# Patient Record
Sex: Female | Born: 1970 | Race: Black or African American | Hispanic: No | Marital: Single | Smoking: Never smoker
Health system: Southern US, Community
[De-identification: ages and names within clinical notes are randomized; demographics above are authoritative.]

## PROBLEM LIST (undated history)

## (undated) DIAGNOSIS — IMO0002 Reserved for concepts with insufficient information to code with codable children: Secondary | ICD-10-CM

## (undated) DIAGNOSIS — I340 Nonrheumatic mitral (valve) insufficiency: Secondary | ICD-10-CM

## (undated) DIAGNOSIS — R0789 Other chest pain: Secondary | ICD-10-CM

## (undated) DIAGNOSIS — R7303 Prediabetes: Secondary | ICD-10-CM

## (undated) DIAGNOSIS — Z803 Family history of malignant neoplasm of breast: Secondary | ICD-10-CM

## (undated) DIAGNOSIS — I10 Essential (primary) hypertension: Secondary | ICD-10-CM

## (undated) DIAGNOSIS — Z800 Family history of malignant neoplasm of digestive organs: Secondary | ICD-10-CM

## (undated) DIAGNOSIS — Z8249 Family history of ischemic heart disease and other diseases of the circulatory system: Secondary | ICD-10-CM

## (undated) DIAGNOSIS — I671 Cerebral aneurysm, nonruptured: Secondary | ICD-10-CM

## (undated) DIAGNOSIS — R87619 Unspecified abnormal cytological findings in specimens from cervix uteri: Secondary | ICD-10-CM

## (undated) DIAGNOSIS — O24419 Gestational diabetes mellitus in pregnancy, unspecified control: Secondary | ICD-10-CM

## (undated) DIAGNOSIS — R112 Nausea with vomiting, unspecified: Secondary | ICD-10-CM

## (undated) DIAGNOSIS — Z9889 Other specified postprocedural states: Secondary | ICD-10-CM

## (undated) DIAGNOSIS — E785 Hyperlipidemia, unspecified: Secondary | ICD-10-CM

## (undated) DIAGNOSIS — K219 Gastro-esophageal reflux disease without esophagitis: Secondary | ICD-10-CM

## (undated) HISTORY — DX: Other chest pain: R07.89

## (undated) HISTORY — DX: Family history of malignant neoplasm of breast: Z80.3

## (undated) HISTORY — DX: Family history of ischemic heart disease and other diseases of the circulatory system: Z82.49

## (undated) HISTORY — DX: Reserved for concepts with insufficient information to code with codable children: IMO0002

## (undated) HISTORY — DX: Prediabetes: R73.03

## (undated) HISTORY — DX: Family history of malignant neoplasm of digestive organs: Z80.0

## (undated) HISTORY — DX: Gestational diabetes mellitus in pregnancy, unspecified control: O24.419

## (undated) HISTORY — DX: Cerebral aneurysm, nonruptured: I67.1

## (undated) HISTORY — DX: Unspecified abnormal cytological findings in specimens from cervix uteri: R87.619

## (undated) HISTORY — PX: INTRACRANIAL ANEURYSM REPAIR: SHX1839

## (undated) HISTORY — DX: Hyperlipidemia, unspecified: E78.5

## (undated) HISTORY — PX: BREAST BIOPSY: SHX20

---

## 1997-07-15 ENCOUNTER — Other Ambulatory Visit: Admission: RE | Admit: 1997-07-15 | Discharge: 1997-07-15 | Payer: Self-pay | Admitting: Gynecology

## 1997-09-30 ENCOUNTER — Ambulatory Visit (HOSPITAL_BASED_OUTPATIENT_CLINIC_OR_DEPARTMENT_OTHER): Admission: RE | Admit: 1997-09-30 | Discharge: 1997-09-30 | Payer: Self-pay | Admitting: Surgery

## 1999-02-20 ENCOUNTER — Other Ambulatory Visit: Admission: RE | Admit: 1999-02-20 | Discharge: 1999-02-20 | Payer: Self-pay | Admitting: Gynecology

## 1999-05-05 ENCOUNTER — Encounter: Payer: Self-pay | Admitting: Gynecology

## 1999-05-05 ENCOUNTER — Ambulatory Visit (HOSPITAL_COMMUNITY): Admission: RE | Admit: 1999-05-05 | Discharge: 1999-05-05 | Payer: Self-pay | Admitting: Gynecology

## 1999-10-06 ENCOUNTER — Ambulatory Visit (HOSPITAL_COMMUNITY): Admission: RE | Admit: 1999-10-06 | Discharge: 1999-10-06 | Payer: Self-pay | Admitting: Gynecology

## 1999-10-06 ENCOUNTER — Encounter: Payer: Self-pay | Admitting: Gynecology

## 2000-02-29 ENCOUNTER — Other Ambulatory Visit: Admission: RE | Admit: 2000-02-29 | Discharge: 2000-02-29 | Payer: Self-pay | Admitting: Gynecology

## 2000-12-14 ENCOUNTER — Ambulatory Visit (HOSPITAL_COMMUNITY): Admission: RE | Admit: 2000-12-14 | Discharge: 2000-12-14 | Payer: Self-pay | Admitting: Gynecology

## 2000-12-14 ENCOUNTER — Encounter: Payer: Self-pay | Admitting: Gynecology

## 2001-04-19 ENCOUNTER — Other Ambulatory Visit: Admission: RE | Admit: 2001-04-19 | Discharge: 2001-04-19 | Payer: Self-pay | Admitting: Obstetrics and Gynecology

## 2002-01-02 ENCOUNTER — Ambulatory Visit (HOSPITAL_COMMUNITY): Admission: RE | Admit: 2002-01-02 | Discharge: 2002-01-02 | Payer: Self-pay | Admitting: Interventional Radiology

## 2002-06-11 ENCOUNTER — Other Ambulatory Visit: Admission: RE | Admit: 2002-06-11 | Discharge: 2002-06-11 | Payer: Self-pay | Admitting: Gynecology

## 2002-10-10 ENCOUNTER — Ambulatory Visit (HOSPITAL_COMMUNITY): Admission: RE | Admit: 2002-10-10 | Discharge: 2002-10-10 | Payer: Self-pay | Admitting: Gynecology

## 2002-10-10 ENCOUNTER — Encounter: Payer: Self-pay | Admitting: Gynecology

## 2003-03-23 HISTORY — PX: MYOMECTOMY: SHX85

## 2003-08-13 ENCOUNTER — Other Ambulatory Visit: Admission: RE | Admit: 2003-08-13 | Discharge: 2003-08-13 | Payer: Self-pay | Admitting: Gynecology

## 2003-08-27 ENCOUNTER — Ambulatory Visit (HOSPITAL_COMMUNITY): Admission: RE | Admit: 2003-08-27 | Discharge: 2003-08-27 | Payer: Self-pay | Admitting: Gynecology

## 2003-11-05 ENCOUNTER — Encounter (INDEPENDENT_AMBULATORY_CARE_PROVIDER_SITE_OTHER): Payer: Self-pay | Admitting: *Deleted

## 2003-11-05 ENCOUNTER — Inpatient Hospital Stay (HOSPITAL_COMMUNITY): Admission: RE | Admit: 2003-11-05 | Discharge: 2003-11-08 | Payer: Self-pay | Admitting: Gynecology

## 2004-05-19 ENCOUNTER — Emergency Department (HOSPITAL_COMMUNITY): Admission: EM | Admit: 2004-05-19 | Discharge: 2004-05-19 | Payer: Self-pay | Admitting: Emergency Medicine

## 2004-10-13 ENCOUNTER — Other Ambulatory Visit: Admission: RE | Admit: 2004-10-13 | Discharge: 2004-10-13 | Payer: Self-pay | Admitting: Gynecology

## 2005-03-26 ENCOUNTER — Other Ambulatory Visit: Admission: RE | Admit: 2005-03-26 | Discharge: 2005-03-26 | Payer: Self-pay | Admitting: Gynecology

## 2005-07-05 ENCOUNTER — Encounter: Admission: RE | Admit: 2005-07-05 | Discharge: 2005-07-05 | Payer: Self-pay | Admitting: Gynecology

## 2005-08-17 ENCOUNTER — Inpatient Hospital Stay (HOSPITAL_COMMUNITY): Admission: AD | Admit: 2005-08-17 | Discharge: 2005-08-20 | Payer: Self-pay | Admitting: Gynecology

## 2005-09-20 ENCOUNTER — Inpatient Hospital Stay (HOSPITAL_COMMUNITY): Admission: RE | Admit: 2005-09-20 | Discharge: 2005-09-23 | Payer: Self-pay | Admitting: Gynecology

## 2005-09-20 ENCOUNTER — Encounter (INDEPENDENT_AMBULATORY_CARE_PROVIDER_SITE_OTHER): Payer: Self-pay | Admitting: *Deleted

## 2005-11-01 ENCOUNTER — Other Ambulatory Visit: Admission: RE | Admit: 2005-11-01 | Discharge: 2005-11-01 | Payer: Self-pay | Admitting: Gynecology

## 2008-03-22 LAB — HM COLONOSCOPY: HM Colonoscopy: NORMAL

## 2009-02-27 ENCOUNTER — Observation Stay (HOSPITAL_COMMUNITY): Admission: EM | Admit: 2009-02-27 | Discharge: 2009-02-28 | Payer: Self-pay | Admitting: Emergency Medicine

## 2009-02-27 ENCOUNTER — Ambulatory Visit: Payer: Self-pay | Admitting: Internal Medicine

## 2009-02-28 ENCOUNTER — Encounter (INDEPENDENT_AMBULATORY_CARE_PROVIDER_SITE_OTHER): Payer: Self-pay | Admitting: Emergency Medicine

## 2009-03-22 LAB — HM MAMMOGRAPHY: HM Mammogram: NORMAL

## 2009-04-17 ENCOUNTER — Ambulatory Visit: Payer: Self-pay | Admitting: Internal Medicine

## 2009-07-09 ENCOUNTER — Ambulatory Visit: Payer: Self-pay | Admitting: Internal Medicine

## 2010-04-22 LAB — HM PAP SMEAR: HM Pap smear: NEGATIVE

## 2010-05-26 ENCOUNTER — Emergency Department (HOSPITAL_COMMUNITY): Payer: BC Managed Care – PPO

## 2010-05-26 ENCOUNTER — Emergency Department (HOSPITAL_COMMUNITY)
Admission: EM | Admit: 2010-05-26 | Discharge: 2010-05-26 | Disposition: A | Discharge status: Home / Self Care | Payer: BC Managed Care – PPO | Attending: Emergency Medicine | Admitting: Emergency Medicine

## 2010-05-26 DIAGNOSIS — K219 Gastro-esophageal reflux disease without esophagitis: Secondary | ICD-10-CM | POA: Insufficient documentation

## 2010-05-26 DIAGNOSIS — R1013 Epigastric pain: Secondary | ICD-10-CM | POA: Insufficient documentation

## 2010-05-26 DIAGNOSIS — R0789 Other chest pain: Secondary | ICD-10-CM | POA: Insufficient documentation

## 2010-05-26 LAB — COMPREHENSIVE METABOLIC PANEL
BUN: 11 mg/dL (ref 6–23)
Calcium: 9.2 mg/dL (ref 8.4–10.5)
Creatinine, Ser: 0.76 mg/dL (ref 0.4–1.2)
Glucose, Bld: 92 mg/dL (ref 70–99)
Sodium: 140 mEq/L (ref 135–145)
Total Protein: 6.4 g/dL (ref 6.0–8.3)

## 2010-05-26 LAB — URINALYSIS, ROUTINE W REFLEX MICROSCOPIC
Bilirubin Urine: NEGATIVE
Nitrite: NEGATIVE
Specific Gravity, Urine: 1.023 (ref 1.005–1.030)
pH: 5.5 (ref 5.0–8.0)

## 2010-05-26 LAB — DIFFERENTIAL
Basophils Absolute: 0 10*3/uL (ref 0.0–0.1)
Eosinophils Absolute: 0.2 10*3/uL (ref 0.0–0.7)
Eosinophils Relative: 5 % (ref 0–5)

## 2010-05-26 LAB — URINE MICROSCOPIC-ADD ON

## 2010-05-26 LAB — CBC
MCH: 28.9 pg (ref 26.0–34.0)
MCV: 89.9 fL (ref 78.0–100.0)
Platelets: 234 10*3/uL (ref 150–400)
RBC: 4.46 MIL/uL (ref 3.87–5.11)

## 2010-05-26 LAB — LIPASE, BLOOD: Lipase: 50 U/L (ref 11–59)

## 2010-05-26 LAB — POCT CARDIAC MARKERS: Troponin i, poc: <0.05 ng/mL (ref 0.00–0.09)

## 2010-05-27 LAB — URINE CULTURE
Colony Count: NO GROWTH
Culture  Setup Time: 201203061111

## 2010-06-23 LAB — POCT CARDIAC MARKERS
CKMB, poc: <1 ng/mL — ABNORMAL LOW (ref 1.0–8.0)
CKMB, poc: <1 ng/mL — ABNORMAL LOW (ref 1.0–8.0)
Myoglobin, poc: 48.5 ng/mL (ref 12–200)
Troponin i, poc: <0.05 ng/mL (ref 0.00–0.09)

## 2010-06-23 LAB — TROPONIN I: Troponin I: 0.02 ng/mL (ref 0.00–0.06)

## 2010-06-23 LAB — URINALYSIS, ROUTINE W REFLEX MICROSCOPIC
Bilirubin Urine: NEGATIVE
Glucose, UA: NEGATIVE mg/dL
Ketones, ur: NEGATIVE mg/dL
pH: 6 (ref 5.0–8.0)

## 2010-06-23 LAB — CK TOTAL AND CKMB (NOT AT ARMC): Total CK: 65 U/L (ref 7–177)

## 2010-06-23 LAB — POCT I-STAT, CHEM 8
Chloride: 105 mEq/L (ref 96–112)
Creatinine, Ser: 0.6 mg/dL (ref 0.4–1.2)
Glucose, Bld: 80 mg/dL (ref 70–99)
HCT: 41 % (ref 36.0–46.0)
Potassium: 4.1 mEq/L (ref 3.5–5.1)
Sodium: 140 mEq/L (ref 135–145)

## 2010-08-07 NOTE — Op Note (Signed)
NAMEALENAH, Sharon Mendoza                         ACCOUNT NO.:  [id number]   MEDICAL RECORD NO.:  [id number]                   PATIENT TYPE:  INP   LOCATION:  9399                                 FACILITY:  [facility]   PHYSICIAN:  Ivor Costa. Farrel Gobble, M.D.              DATE OF BIRTH:  September 19, 1970   DATE OF PROCEDURE:  11/05/2003  DATE OF DISCHARGE:                                 OPERATIVE REPORT   PREOPERATIVE DIAGNOSIS:  Symptomatic fibroid uterus.   POSTOPERATIVE DIAGNOSIS:  Symptomatic fibroid uterus.   PROCEDURE:  Multiple myomectomy.   SURGEON:  Ivor Costa. Farrel Gobble, M.D.   ASSISTANTMarcial Pacas P. Fontaine, M.D.   ANESTHESIA:  General.   INTRAVENOUS FLUID:  1100 mL of lactated Ringer's.   ESTIMATED BLOOD LOSS:  300 mL.   URINE OUTPUT:  200 mL of clear urine.   FINDINGS:  Multiple uterine fibroids, total of 16 removed.  Normal tubes and  ovaries.   COMPLICATIONS:  None.   PATHOLOGY:  Fibroids.   DESCRIPTION OF PROCEDURE:  The patient was taken to the operating room, and  general anesthesia was induced.  She was prepped in the usual sterile  fashion.  A bivalve speculum was then placed in the vagina.  The cervix was  able to be visualized, and a Foley balloon was gently guided through the  cavity and blown up.  This was attached to sterile tubing with dye for  chromopertubation to be done at the end of the procedure.  She was then  prepped and draped in the usual sterile fashion.  A Pfannenstiel skin  incision was made with a scalpel and carried through the underlying layer of  fascia with electrocautery.  The fascia was scored in the midline, incision  was extended laterally with the Mayo scissors.  The inferior aspect of the  vaginal incision was grasped with Kochers.  The underlying rectus muscles  were dissected off by blunt and sharp dissection.  In a similar fashion, the  superior aspect of the incision was grasped with Kochers, and underlying  rectus muscles were dissected  off.  The rectus muscles were separated in the  midline at which point the peritoneum was visualized.  It was noted to be  dusky and when elevated and sharply entered, there was noted to be a  hemoperitoneum, the etiology of which was unclear as we had not entered the  cavity at this point.  An examining hand was then placed in the abdomen.  The bowel and omentum were inspected and noted to be unremarkable.  The  uterus was then delivered through the incision, and there was noted to be a  rent in the serosa of the uterus at the junction of one of the large  pedunculated fibroids.  The uterus was then stabilized, and the fibroid as  well as its base was injected with dilute Pitressin solution.  Once the  bleeding had  subsided substantially, we injected the remainder of the  visible fibroids with Pitressin solution.  A single-tooth tenaculum was  placed for better mobility of the uterus.  An O'Connor-O'Sullivan retractor  was placed in the incision, and bowel was packed away.  The tubes and  ovaries were noted to be unremarkable.  We began at the area of the  pedunculated fibroid that had a spontaneous rent.  It was proximal to the  tube, however, was able to easily be dissected off without any interference  of the tube, and this was done sharply with electrocautery.  We also Bovie  cauterized transecting vessels as we progressed through the fibroid.  Another small fibroid was appreciated just inferior, and this was taken out  through the same incision.  The incision was noted to be essentially  hemostatic.  There were two smaller fibroids that were appreciated, but  these were left to be removed later.  The uterus was then rotated  inferiorly.  A large posterior fibroid was appreciated.  The serosa of the  uterus was scored, and the incision was carried through until the fibroid  was reached.  The serosa was then grasped, and the fibroid was similarly  removed with electrocautery.  A second  small fibroid was removed from this  area.  At this point, we entered the cavity and the Foley balloon placed for  later chromopertubation was identified.  There was no other fibroid noted at  this point.  Attempt to deviate the Foley in order to close the endometrial  defect was unsuccessful and the Foley balloon ultimately deflated and was  removed from the patient.  The endometrium was then reapproximated with 3-0  Vicryl.  The deep myometrial defect was reapproximated with multiple layers  of 0 Vicryl.  The serosa came together nicely and was reapproximated with 3-  0 Vicryl and noted to be hemostatic.  Attention was then turned anteriorly.  There was a small fibroid that was noted to be under the bladder flap.  The  bladder flap was elevated, entered sharply.  The incision was then carried  down bluntly until the fibroid was completely visualized and in a similar  fashion the uterine muscle was scored; the fibroid was grasped and removed  sharply.  There was a small amount of bleeding noted which was treated  successfully with cautery and at the end of the procedure, we later  identified and confirmed hemostasis in this area.  Attention was then turned  superiorly to a very large fibroid that was present and in a similar  fashion, the serosa was scored; the fibroid was excised, and passed off the  field.  Going through this incision, we palpated multiple other fibroids  which were removed through this single incision for a total of approximately  5 and after this was palpated and felt to be free of fibroids, we  reapproximated the myometrium in a similar fashion with 0 Vicryl and the  serosa with 3-0 Vicryl and, again, hemostasis was noted.  There was also  another series of fibroids that were noted close to the original incision  where the pedunculated fibroid was.  The incision was carried inferiorly, and a total of 4 fibroids were removed from this area as well.  And in a  similar  fashion, the deep muscle and serosa was closed.  Another small  fibroid was removed anteriorly just superior to the bladder flap on the  left.  Multiple small fibroids of a few millimeters  were visualized and  ablated with cautery.  The pelvis was then irrigated was copious amounts of  warm saline, and we inspected all of our areas, during which time we noted a  broad ligament fibroid.  The peritoneum was elevated around the round  ligament, gently entered.  The fibroid was able to be visualized and removed  with cautery with hemostasis appreciated of the round ligament.  Re-  irrigation of the pelvis assured [place] of hemostasis on all of our incisions.  The retractors were then removed.  The tubes and ovaries were confirmed as  normal.  The fimbriae were noted to be lush when the irrigant was in the  pelvis.  The muscle reapproximated in the midline naturally.  The fascia was  then closed with 0 Vicryl in a running fashion.  The subcu was irrigated and  reapproximated with interrupted 2-0 plain, and the skin was closed with  staples.  The patient tolerated the procedure well.  Sponge, lap, and needle  counts were correct x 2.  She received Ancef intraoperatively; however, at  the start of the procedure, we learned that the patient had too numerous to  count WBCs, and so she received also a single dose of Cipro 400 IV.                                               Ivor Costa. Farrel Gobble, M.D.    THL/MEDQ  D:  11/05/2003  T:  11/05/2003  Job:  [id number]

## 2010-08-07 NOTE — H&P (Signed)
Sharon Mendoza             ACCOUNT NO.:  [id number]   MEDICAL RECORD NO.:  [id number]          PATIENT TYPE:  INP   LOCATION:  NA                            [facility]AC[facility]  [facility]   PHYSICIAN:  Ivor Costa. Farrel Gobble, M.D. DATE OF BIRTH:  05-26-1970   DATE OF ADMISSION:  DATE OF DISCHARGE:                                HISTORY & PHYSICAL   DATE OF ADMISSION:  September 20, 2005   PRINCIPAL DIAGNOSIS:  Thirty-seven-and-a-half-week pregnancy.   HISTORY OF PRESENT ILLNESS:  The patient is [name] with an LMP of  January 01, 2006; estimated date of confinement of October 08, 2005; estimated  gestational age of 37+ weeks whose pregnancy has been complicated by preterm  labor.  The patient has been seen in the office as well as admitted to the  hospital multiple times for preterm contractions, cervicitis, and preterm  labor.  The patient is currently on Procardia XL 30 mg daily.  We requested  that the patient increase the Procardia to b.i.d.; however, she did not.  The patient, because of a history of a prior myomectomy, was scheduled for  an elective cesarean section at 38-and-a-half weeks.  However, because of  the frequency of the uterine contractions, the patient decided instead to  move the C-section up.  Risks of pulmonary maturity were reviewed with her  and accepted.  In addition, the pregnancy has been complicated by  gestational diabetes which has been moderately well controlled, as well as  advanced maternal age, and the patient did not have aneuploidy screening.  Refer to the [facility].  She is O positive, antibody negative, RPR  nonreactive, rubella immune, hepatitis B surface antigen nonreactive, HIV  nonreactive, GBS negative.   PHYSICAL EXAMINATION:  GENERAL:  She is a well-appearing gravida in no acute  distress.  HEART:  Regular rate.  LUNGS:  Clear to auscultation.  ABDOMEN:  Her uterus was gravid and nontender.  Fetal heart tones were  auscultated.  PELVIC:  On vaginal  exam, her external os was fingertip.  Her cervix was  short, soft, and -1.  Her NST was reactive and notable for contractions in  an irregular pattern.  EXTREMITIES:  Negative.   ASSESSMENT:  Thirty-seven-plus weeks with preterm contractions for an  elective primary cesarean section because of a prior myomectomy.  The  patient will present now for surgery as mentioned above.      Ivor Costa. Farrel Gobble, M.D.  Electronically Signed     THL/MEDQ  D:  09/17/2005  T:  09/17/2005  Job:  [id number]

## 2010-08-07 NOTE — Op Note (Signed)
NAMEBERTIE, MCCONATHY             ACCOUNT NO.:  [id number]   MEDICAL RECORD NO.:  [id number]          PATIENT TYPE:  INP   LOCATION:  9133                          [facility]   PHYSICIAN:  Ivor Costa. Farrel Gobble, M.D. DATE OF BIRTH:  May 10, 1970   DATE OF PROCEDURE:  DATE OF DISCHARGE:                                 OPERATIVE REPORT   PREOPERATIVE DIAGNOSES:  1.  Thirty-seven and one half week intrauterine pregnancy.  2.  Previous myomectomy.  3.  Preterm labor.  4.  Gestational diabetes.   POSTOPERATIVE DIAGNOSES:  1.  Thirty-seven and one half week intrauterine pregnancy.  2.  Previous myomectomy.  3.  Preterm labor.  4.  Gestational diabetes.  5.  Extensive pelvic and bowel adhesions.   PROCEDURE:  Primary cesarean.   ADDITIONAL PROCEDURE:  1.  Lysis of adhesions, extensive.  2.  Revsion of scar.   SURGEON:  Dr. Farrel Gobble   ASSISTANT:  Dr. Audie Box   ANESTHESIA:  Spinal.   IV FLUIDS:  3900 mL of lactated Ringer's.   ESTIMATED BLOOD LOSS:  600 mL.   URINE OUTPUT:  400 mL of clear urine.   FINDINGS:  A viable female in the vertex presentation.  Clear amniotic fluid.  Birth weight 7 pounds, Apgars 8/9.  There was a loop which turned to be a  single loop of small bowel adhesions adherent to the lower segment  peritoneum.  There were thick peritoneal bladder adhesions.  The tubes and  ovaries were not able to be visualized.   COMPLICATIONS:  None.   PATHOLOGY:  Placenta.   PROCEDURE:  The patient was taken to the operating room.  Spinal anesthesia  was induced and placed in a supine position, left lateral placement, prepped  and draped in usual sterile fashion.  After adequate anesthesia was ensured,  a Pfannenstiel skin incision was made with the scalpel, going through the  previous myomectomy scar with resection of the left side of the scar  secondary to keloid.  The incision was then carried through until the fascia  was reached which was scored in the midline  and extended laterally with the  Bovie.  The inferior aspect of the fascial incision was grasped with  Kochers.  It was noted to be markedly adherent and was dissected off by  blunt and sharp dissection.  Areas of bleeding were treated where  appropriate.  The superior aspect of this incision was then grasped with  Kochers; underlying rectus muscles were similarly dissected off by blunt and  sharp dissection.  The rectus muscles were naturally separated in the  midline, and the peritoneum had been entered during the dissection.  Examining hand was placed in the peritoneal rent, felt to be bowel, was  adherent across the entirety of the incision.  It was able to be swept  laterally to the right but not to the left.  The pyramidalis muscles were  then elevated inferiorly and gently teased off the peritoneum what appeared  to be bowels.  Just superior to this aspect was carefully blunt and sharply  dissected off the rectus muscle.  The pyramidalis muscles were ultimately  able to be sharply dissected off, and what looked like a 2nd loop of bowel  actually was a thick bladder adhesion.  The incision was then extended  laterally across the peritoneum, scoring a small portion of the muscle.  The  bowel was then able to be grasped and gently retracted so that it was able  to slowly be teased off the underlying thick peritoneal band, and this was  done successfully.  There was a small amount of bleeding, and it was felt to  be on the adhesion side of the bowel; it was noted to be free of the small  bowel.  Because of the bleeding, however, it was reapproximated with 3-0  Vicryl and was held on a tab for later identification at the end of the  procedure.  The bladder was then able to be identified, tented up, and  entered sharply with the Metzenbaums.  The bladder flap was created  digitally.  The bladder blade was then inserted in the lower uterine segment  and was incised in a transverse fashion  with a scalpel.  An amniotomy of  clear fluid was noted upon entering the cavity, and the incision was  extended bluntly.  The infant was then delivered with the aid of Baby  Elliots.  The cord was cut and clamped, handed off to the waiting  pediatricians.  The placenta was removed intact and sent off for private  cord blood banking.  The uterus was then cleared of all clots and debris.  Uterine incision was repaired with a running locked layer of 0 chromic, and  a second suture was used for imbrication purposes.  The pelvis was then  irrigated with copious amounts of warm saline.  There was a small amount of  bleeding in the peritoneum.  We then inspected the bowel adhesion, and a  small free tied ligature of the 3-0 Vicryl was placed, and hemostasis at  this point was assured.  Again, inspection showed that we were free of the  mucosa of the bowel.  It was then replaced and placed posteriorly behind the  uterus.  Because of the adhesions, however, we were unable to visualize the  adnexa, as the thick band on the left-hand side had never been taken down.  The bladder and muscles were inspected and treated where appropriate.  The  fascia was then closed with 0 Vicryl in a running fashion.  The subcu was  irrigated and treated where appropriate.  The skin was then closed with  staples.  A pressure dressing was placed.  The patient tolerated the  procedure well.  Sponge, lap, and needle counts correct x2.  She was given  Ancef intraoperatively and transferred to the PACU in stable condition.      Ivor Costa. Farrel Gobble, M.D.  Electronically Signed     THL/MEDQ  D:  09/20/2005  T:  09/20/2005  Job:  [id number]

## 2010-08-07 NOTE — Discharge Summary (Signed)
NAMESIHAM, BUCARO             ACCOUNT NO.:  [id number]   MEDICAL RECORD NO.:  [id number]          PATIENT TYPE:  INP   LOCATION:  9152                          [facility]ACILITY:  [facility]   PHYSICIAN:  Timothy P. Fontaine, M.D.DATE OF BIRTH:  January 25, 1971   DATE OF ADMISSION:  08/16/2005  DATE OF DISCHARGE:                                 DISCHARGE SUMMARY   DISCHARGE DIAGNOSES:  1.  Pregnancy at [redacted] weeks gestation, undelivered.  2.  Preterm contractions.  3.  Gestational diabetes.  4.  History of prior myomectomy for repeat cesarean section.   PROCEDURES:  None.   HOSPITAL COURSE:  A 40 year old gravida 1, para 0 at [redacted] weeks gestation who  enters with regular uterine contractions.  Her cervix was long and closed.  Fetal fibronectin was negative.  Urinalysis was suspicious for UTI.  The  patient continued to contract despite IV hydration, subsequent terbutaline  subcu and ultimately was begun on magnesium sulfate tocolysis.  The patient  was transitioned with the discontinuation of the magnesium and subsequently  began to contract and was subsequently begun on p.o. Procardia 10 mg q.6 h.  The patient was treated with betamethasone 12 mg q.24 h x2 doses due to her  gestational age and the gestational diabetes.  The patient was also treated  with Unasyn initially due to her suspicious UTI, although developed a rash  following her first dose and was switched to Macrobid 100 b.i.d.  At the  time of discharge the patient was having uterine irritability which she has  had over 24 hours prior to discharge but without regular contractions.  The  fetal tracing was reactive and the cervical exam prior to discharge showed  her cervix unchanged, long, closed and posterior.  The patient was given  ASAP call precautions, increasing pain, contractions, bleeding pressure,  rupture of membranes the patient knows to call.  Her history of prior  myomectomy and risk of uterine rupture was stressed to her and  she  understands that she should not tolerate discomfort at home.  She will be at  bedrest at home and has an appointment already scheduled in the office at  the beginning of week following discharge.      Timothy P. Fontaine, M.D.  Electronically Signed     TPF/MEDQ  D:  08/20/2005  T:  08/20/2005  Job:  [id number]

## 2010-08-07 NOTE — H&P (Signed)
NAMESTUART, Sharon Mendoza                         ACCOUNT NO.:  [id number]   MEDICAL RECORD NO.:  [id number]                   PATIENT TYPE:  INP   LOCATION:  NA                                   [facility]   PHYSICIAN:  Ivor Costa. Farrel Gobble, M.D.              DATE OF BIRTH:  1970-10-23   DATE OF ADMISSION:  DATE OF DISCHARGE:                                HISTORY & PHYSICAL   CHIEF COMPLAINT:  Symptomatic multifibroid uterus.   HISTORY OF PRESENT ILLNESS:  The patient is a 40 year old gravida 0 with a  known history of a multifibroid uterus with her menorrhagia being well  controlled with Mircette.  The patient is planning on getting married in  October 2005 and would like to conceive a child shortly thereafter.  She has  not been sexually active with her upcoming fiance but has been sexually  active in the past and had problems with dyspareunia.  She has multiple  fibroids and her uterus is U-1.   PAST OBSTETRICAL AND GYNECOLOGICAL HISTORY:  The patient is nulliparous by  choice.  She is currently not sexually active as mentioned above.  Her  cycles are every 4 weeks on the birth control pill.  Her Pap smears have  been normal.   PAST MEDICAL HISTORY:  Negative.   SURGICAL HISTORY:  Negative.   FAMILY HISTORY:  Markedly significant for history of AVMs of which the  patient has had several siblings who have died from rupture and several  siblings who have had AVMs that have been clipped.  The patient has been  followed by [facility] with MRAs and most recent report continues to be  normal.   ALLERGIES:  None.   SOCIAL HISTORY:  No alcohol, tobacco, caffeine, or exercise.   FAMILY HISTORY:  As mentioned above.  There is a family history of breast  cancer in a paternal aunt and colon cancer in her father and her paternal  aunt.   PHYSICAL EXAMINATION:  GENERAL:  She is well-appearing, in no acute  distress.  HEART:  Regular rate.  LUNGS:  Clear to auscultation.  ABDOMEN:   Soft, nontender, with a multifibroid uterus appreciated at U-1.  GYNECOLOGIC:  She has normal external female genitalia.  The BUS is  negative.  Vagina:  There is a small amount of discharge and a wet prep was  taken.  The cervix is markedly deviated posteriorly and unable to be  completely rotated secondary to the multiple fibroids.  However, with  difficulty we were able to visualize and Pap smear was able to be done.  The  uterus was bulky, U-1. The adnexa are not palpable.  Rectovaginal exam is  significant for a sharp impingement on the rectum from the fibroids.   Her latest ultrasound shows six fibroids of which three are significant -  about 6 cm each.  The ovaries were unremarkable.  The patient  had a  preoperative HSG done which shows that her tubes are normal in caliber and  the right tube no definite free spill is noted.  From the left tube there is  an impingement on the cavity consistent with the multiple fibroids.  The  patient had an IVP because of the laterality of the fibroids which was also  negative.   ASSESSMENT:  Multifibroid uterus, desiring pregnancy.  The patient will  present in the morning of November 05, 2003 for a multiple myomectomy.                                               Ivor Costa. Farrel Gobble, M.D.    THL/MEDQ  D:  11/04/2003  T:  11/04/2003  Job:  [id number]

## 2010-08-07 NOTE — Discharge Summary (Signed)
NAMELAJEAN, Sharon Mendoza             ACCOUNT NO.:  [id number]   MEDICAL RECORD NO.:  [id number]          PATIENT TYPE:  INP   LOCATION:  9133                          [facility]   PHYSICIAN:  Ivor Costa. Farrel Gobble, M.D. DATE OF BIRTH:  1970/08/22   DATE OF ADMISSION:  09/20/2005  DATE OF DISCHARGE:  09/23/2005                                 DISCHARGE SUMMARY   PRINCIPAL DIAGNOSES:  1.  37-1/2 weeks with history of preterm labor.  2.  History of myomectomy.   PRINCIPAL PROCEDURE:  A primary cesarean section. Refer to the dictated H&P.   HOSPITAL COURSE:  The patient presented in the afternoon of September 20, 2005 and  underwent a primary cesarean section with extensive lysis of adhesions and  revision of her scar under spinal anesthesia for delivery of a viable female  in the vertex presentation. Birth weight 7, Apgars 8/9. The adnexa were not  able to be visualized secondary to extensive pelvic scarring. Estimated  blood loss was 600. Her postpartum course was unremarkable. She remained  afebrile with vitals stable throughout. The patient was breast-feeding at  the time of discharge. She was using only Motrin for analgesia and was eager  for discharge.   PHYSICAL EXAMINATION:  VITAL SIGNS:  She was afebrile. Her vitals were  stable.  ABDOMEN:  Soft and nontender. Incision was clean, dry, intact.  PELVIC:  Uterus was below the umbilicus and nontender.  EXTREMITIES:  Negative.   POSTOPERATIVE LABORATORIES:  Hemoglobin 11.4, hematocrit 33.5, white count  10, and platelets 157.   DISCHARGE CONDITION:  Stable.   The patient was discharged home with instructions to follow up in the office  in six weeks. Staples will be removed prior to discharge. She had been given  a prescription for Tylox 1-2 q.6 h. p.r.n. at her preop.      Ivor Costa. Farrel Gobble, M.D.  Electronically Signed     THL/MEDQ  D:  09/23/2005  T:  09/23/2005  Job:  [id number]

## 2010-08-07 NOTE — Discharge Summary (Signed)
Sharon Mendoza, Sharon Mendoza                         ACCOUNT NO.:  [id number]   MEDICAL RECORD NO.:  [id number]                   PATIENT TYPE:  INP   LOCATION:  9309                                 FACILITY:  [facility]   PHYSICIAN:  Ivor Costa. Farrel Gobble, M.D.              DATE OF BIRTH:  Feb 20, 1971   DATE OF ADMISSION:  11/05/2003  DATE OF DISCHARGE:                                 DISCHARGE SUMMARY   PRINCIPAL DIAGNOSIS:  Symptomatic fibroids.   PRINCIPAL PROCEDURE:  Multiple myomectomy.   Please refer to the dictated H&P.   HOSPITAL COURSE:  The patient was admitted in the morning of November 05, 2003  and underwent an exploratory laparotomy with a multiple myomectomy under  general anesthesia.  Estimated blood loss was approximately 300 mL.  She was  noted to have multiple uterine fibroids.  A total of 16 were removed.  Normal tubes and ovaries.  Her postoperative course, the patient was  transferred to the PACU and then to the postpartum floor in due fashion.  Initially had problems with nausea without emesis which resolved on  postoperative day #1 after discontinuing her Dilaudid PCA.  Although she was  tolerating minimal regular diet the patient did not have any emesis.  She  developed a fever between the evening of postoperative day #1 and  postoperative day #2.  Because of bleeding the patient had not been placed  on NSAIDs postoperatively and elevated temperature was felt to be  inflammatory.  She had a CBC done at that time which showed a slight  increase in her white count of 12.6, otherwise was unremarkable.  Her Tmax  was 100.6.  It quickly went down to 100 and she quickly defervesced once  NSAIDs were begun.  She was begun empirically overnight on Unasyn which had  been discontinued in the morning.  By postoperative day #3 the patient had  been afebrile greater than 24 hours.  She was requesting discharge.  She was  requiring no pain medicine above the NSAIDs that she was taking.  She  was  ambulating without difficulty, tolerating a regular diet.  Her postoperative  physical:  Her heart was regular rate, her lungs were clear to auscultation,  she had normal bowel sounds, there was some slight ecchymosis on the  abdomen, the incision was intact.  The patient was observed until the  afternoon of August 19, again remained afebrile, and was discharged home.  She was instructed to check her temperature twice a day and p.r.n. symptoms.  She had been given a prescription for Combunox preoperatively.  She will  also take over-the-counter Motrin for the inflammatory aspect and she is  instructed to follow up in our office in 2 weeks.   Her postoperative white count was 11.2, hemoglobin 10.3, hematocrit 30.4,  platelets of 208.  Her postoperative pathology came back today and was  benign with approximately 250 g of fibroids.  POSTOPERATIVE DISPOSITION:  Stable.                                               Ivor Costa. Farrel Gobble, M.D.    THL/MEDQ  D:  11/08/2003  T:  11/08/2003  Job:  [id number]

## 2010-08-07 NOTE — H&P (Signed)
Sharon Mendoza, Sharon Mendoza             ACCOUNT NO.:  [id number]   MEDICAL RECORD NO.:  [id number]          PATIENT TYPE:  OBV   LOCATION:  9152                          FACILITY:  [facility]   PHYSICIAN:  Timothy P. Fontaine, M.D.DATE OF BIRTH:  January 13, 1971   DATE OF ADMISSION:  08/16/2005  DATE OF DISCHARGE:                                HISTORY & PHYSICAL   CHIEF COMPLAINT:  Cramping, bloody show.   HISTORY OF PRESENT ILLNESS:  [name], P0, at 49 weeks' gestation,  who presents complaining of a week to two weeks of Braxton-Hicks-type  contractions, over the last 24 hours began to increase with more intense  cramping and noted a bloody tinge on wiping in the bathroom.  She presented  to triage for evaluation.  The patient was noted in triage to have cramping,  uterine irritability, as well as regular contractions, initially every 10-15  minutes, then increasing to every 2-3 minutes per nursing report.  Nursing  exam:  Cervix is long and closed.  A fetal fibronectin was performed before  pelvic, which subsequently returned negative.  She was treated with IV  hydration.  Her contractions continued.  One subcutaneous dose of  terbutaline and again her contractions continued increasing in intensity,  rated as a 4-5.  She ultimately was begun on magnesium sulfate, a 4 g bolus,  2 g/hr.   Her prenatal course is significant for gestational diabetes, diet-  controlled, for which she reports good glucose control.  She  is scheduled  for a cesarean section September 29, 2005 due to history of myomectomy.  Her  Hollister is not available at the time of this dictation.   PAST MEDICAL HISTORY:  Uncomplicated.   PAST SURGICAL HISTORY:  abdominal myomectomy.   ALLERGIES:  None.   REVIEW OF SYSTEMS:  Noncontributory.   ADMISSION PHYSICAL EXAMINATION:  VITAL SIGNS:  Afebrile, vital signs are  stable.  HEENT:  Normal.  LUNGS:  Clear.  CARDIAC:  Regular rate without rubs, murmurs or gallops.  ABDOMEN:  Gravid fundus consistent with dates.  The uterus is soft,  nontender.  External monitors show a reassuring fetal tracing without  regular contractions.  PELVIC:  Cervix is long and closed, posterior.  Normal-appearing discharge,  no bloody show.   ASSESSMENT AND PLAN:  [name], P0, female, admitted with regular  contractions.  Her urinalysis subsequently returned suspicious for a urinary  tract infection.  She was begun on Unasyn 1.5 g IV q.6h.  Fetal fibronectin  was negative but given the continued worsening contractions, probable  etiology of a UTI, history of gestational diabetes at 32 weeks, it was felt  most prudent to proceed with a more aggressive tocolysis until antibiotics  have taken effect.  I reviewed this with the patient.  She understands and  agrees.  Reviewed steroid use and, again, given the total picture, I feel  appropriate to withhold steroids at this time given that cervix is long and  closed, fetal fibronectin negative, infection is suspected etiology of her  preterm contractions.  Will hold on steroids at this time and, again, this  is discussed with the patient.  Will transition to p.o. terbutaline for a  short course, again,  allowing antibiotics to take effect.  She did have hives four hours after  her first Unasyn dose, no history of penicillin or any drug allergies in the  past.  Will switch to p.o. Macrobid now, check baseline ultrasound and then  reassess for discharge later today or tomorrow if she remains acontractile.      Timothy P. Fontaine, M.D.  Electronically Signed     TPF/MEDQ  D:  08/17/2005  T:  08/17/2005  Job:  [id number]

## 2011-05-15 ENCOUNTER — Emergency Department (HOSPITAL_COMMUNITY)
Admission: EM | Admit: 2011-05-15 | Discharge: 2011-05-16 | Disposition: A | Discharge status: Home / Self Care | Payer: BC Managed Care – PPO | Attending: Emergency Medicine | Admitting: Emergency Medicine

## 2011-05-15 ENCOUNTER — Emergency Department (HOSPITAL_COMMUNITY): Payer: BC Managed Care – PPO

## 2011-05-15 ENCOUNTER — Other Ambulatory Visit: Payer: Self-pay

## 2011-05-15 ENCOUNTER — Encounter (HOSPITAL_COMMUNITY): Payer: Self-pay | Admitting: *Deleted

## 2011-05-15 DIAGNOSIS — R110 Nausea: Secondary | ICD-10-CM | POA: Insufficient documentation

## 2011-05-15 DIAGNOSIS — I10 Essential (primary) hypertension: Secondary | ICD-10-CM | POA: Insufficient documentation

## 2011-05-15 DIAGNOSIS — R0789 Other chest pain: Secondary | ICD-10-CM | POA: Insufficient documentation

## 2011-05-15 HISTORY — DX: Essential (primary) hypertension: I10

## 2011-05-15 LAB — CBC
MCV: 88.1 fL (ref 78.0–100.0)
Platelets: 242 10*3/uL (ref 150–400)
RBC: 4.54 MIL/uL (ref 3.87–5.11)
WBC: 8 10*3/uL (ref 4.0–10.5)

## 2011-05-15 LAB — BASIC METABOLIC PANEL
CO2: 27 mEq/L (ref 19–32)
Chloride: 103 mEq/L (ref 96–112)
Creatinine, Ser: 0.73 mg/dL (ref 0.50–1.10)
Sodium: 138 mEq/L (ref 135–145)

## 2011-05-15 LAB — TROPONIN I
Troponin I: <0.3 ng/mL (ref <0.30)
Troponin I: <0.3 ng/mL (ref <0.30)

## 2011-05-15 MED ORDER — NITROGLYCERIN 0.4 MG SL SUBL
0.4000 mg | SUBLINGUAL_TABLET | SUBLINGUAL | Status: DC | PRN
  Administered 2011-05-15: 0.4 mg via SUBLINGUAL
  Filled 2011-05-15: qty 75

## 2011-05-15 MED ORDER — ASPIRIN 325 MG PO TABS
325.0000 mg | ORAL_TABLET | ORAL | Status: AC
  Administered 2011-05-15: 325 mg via ORAL
  Filled 2011-05-15: qty 1

## 2011-05-15 NOTE — ED Notes (Signed)
Pt stated that she started having Chest pain yesterday. The pain radiated to both of her shoulders yesterday but currently is not radiating. Pain starts in the middle of her chest. Pain feels like a dull pressure. Pain is intermittent. No SOB or vomiting. Nausea present with pain. Will continue to monitor.

## 2011-05-15 NOTE — ED Notes (Signed)
Pt given 1 SL nitro with CP of 5/10, went in room to give pt second SL nitro pt states pain is a 0/10. Pt aware that nitro is ordered PRN and given call light to press if CP returns.

## 2011-05-15 NOTE — ED Notes (Signed)
EKG was done at [facility]

## 2011-05-15 NOTE — ED Notes (Signed)
Pt with left cheat pain radiating to right taht began at 4pm today and has been associated with nausea, no other symptoms with this.

## 2011-05-16 NOTE — ED Provider Notes (Signed)
History     CSN: [id number]  Arrival date & time 05/15/11  4098   First MD Initiated Contact with Patient 05/15/11 2133      Chief Complaint  Patient presents with  . Chest Pain     Patient is a 41 y.o. female presenting with chest pain. The history is provided by the patient.  Chest Pain    patient reports approximately 4 and half hours of chest discomfort without radiation.  She denies shortness of breath.  She reports nausea without diaphoresis.  She has no vomiting.  She's had no abdominal pain.  She denies diarrhea.  She said no melena or hematochezia.  She's had no cough or congestion.  She denies recent lung travel or surgery.  She has no prior history of pulmonary embolism or DVT.  She has no prior history of coronary artery disease.  She does have a strong family history of heart disease.  She has a history of hypertension.  She denies smoking diabetes and high cholesterol.  She received an aspirin and nitroglycerin.  She reports no pain at this time.  She does report stress test approximately a year and after that was normal.  She never has exertional discomfort in her chest  Past Medical History  Diagnosis Date  . Hypertension     History reviewed. No pertinent past surgical history.  No family history on file.  History  Substance Use Topics  . Smoking status: Not on file  . Smokeless tobacco: Not on file  . Alcohol Use: No    OB History    Grav Para Term Preterm Abortions TAB SAB Ect Mult Living                  Review of Systems  Cardiovascular: Positive for chest pain.  All other systems reviewed and are negative.    Allergies  Review of patient's allergies indicates no known allergies.  Home Medications   Current Outpatient Rx  Name Route Sig Dispense Refill  . ASPIRIN EC 81 MG PO TBEC Oral Take 81 mg by mouth daily.    Marland Kitchen CETIRIZINE HCL 10 MG PO TABS Oral Take 10 mg by mouth daily.    . NORETHINDRONE ACET-ETHINYL EST 1.5-30 MG-MCG PO TABS Oral  Take 1 tablet by mouth daily.    Marland Kitchen PANTOPRAZOLE SODIUM 40 MG PO TBEC Oral Take 40 mg by mouth daily.      BP 125/75  Pulse 80  Temp(Src) 98.3 F (36.8 C) (Oral)  Resp 16  Ht 5\' 3"  (1.6 m)  Wt 118 lb (53.524 kg)  BMI 20.90 kg/m2  SpO2 99%  LMP 04/23/2011  Physical Exam  Nursing note and vitals reviewed. Constitutional: She is oriented to person, place, and time. She appears well-developed and well-nourished. No distress.  HENT:  Head: Normocephalic and atraumatic.  Eyes: EOM are normal.  Neck: Normal range of motion.  Cardiovascular: Normal rate, regular rhythm and normal heart sounds.   Pulmonary/Chest: Effort normal and breath sounds normal.  Abdominal: Soft. She exhibits no distension. There is no tenderness.  Musculoskeletal: Normal range of motion.  Neurological: She is alert and oriented to person, place, and time.  Skin: Skin is warm and dry.  Psychiatric: She has a normal mood and affect. Judgment normal.    ED Course  Procedures (including critical care time)   Date: 05/16/2011  Rate: 101  Rhythm: normal sinus rhythm  QRS Axis: normal  Intervals: normal  ST/T Wave abnormalities: normal  Conduction  Disutrbances: none  Narrative Interpretation:   Old EKG Reviewed: No prior EKG    Labs Reviewed  CBC  BASIC METABOLIC PANEL  TROPONIN I  TROPONIN I   Dg Chest 2 View  05/15/2011  *RADIOLOGY REPORT*  Clinical Data: 41 year old female with chest pain.  CHEST - 2 VIEW  Comparison: 05/26/2010 and earlier.  Findings: Stable lung volumes at the upper limits of normal. Normal cardiac size and mediastinal contours.  Visualized tracheal air column is within normal limits.  The lungs are clear.  No pneumothorax or effusion. No acute osseous abnormality identified.  IMPRESSION: No acute cardiopulmonary abnormality.  Original Report Authenticated By: Harley Hallmark, M.D.   I personally reviewed the x-ray  1. Chest pain       MDM  The patient's patient's only risk  factor hypertension and family history.  She is well appearing.  She is without pain at this time.  EKG is normal.  Her EKG was obtained she was having active pain.  She has 2 negative sets of cardiac enzymes in the ER.  The patient will be referred outpatient cardiology followup.  She understands to return to the ER for new or worsening symptoms.  She is agreeable to outpatient plan        Lyanne Co, MD 05/16/11 [id number]

## 2011-05-16 NOTE — Discharge Instructions (Signed)
Chest Pain (Nonspecific) It is often hard to give a specific diagnosis for the cause of chest pain. There is always a chance that your pain could be related to something serious, such as a heart attack or a blood clot in the lungs. You need to follow up with your caregiver for further evaluation. CAUSES   Heartburn.   Pneumonia or bronchitis.   Anxiety and stress.   Inflammation around your heart (pericarditis) or lung (pleuritis or pleurisy).   A blood clot in the lung.   A collapsed lung (pneumothorax). It can develop suddenly on its own (spontaneous pneumothorax) or from injury (trauma) to the chest.  The chest wall is composed of bones, muscles, and cartilage. Any of these can be the source of the pain.  The bones can be bruised by injury.   The muscles or cartilage can be strained by coughing or overwork.   The cartilage can be affected by inflammation and become sore (costochondritis).  DIAGNOSIS  Lab tests or other studies, such as X-rays, an EKG, stress testing, or cardiac imaging, may be needed to find the cause of your pain.  TREATMENT   Treatment depends on what may be causing your chest pain. Treatment may include:   Acid blockers for heartburn.   Anti-inflammatory medicine.   Pain medicine for inflammatory conditions.   Antibiotics if an infection is present.   You may be advised to change lifestyle habits. This includes stopping smoking and avoiding caffeine and chocolate.   You may be advised to keep your head raised (elevated) when sleeping. This reduces the chance of acid going backward from your stomach into your esophagus.   Most of the time, nonspecific chest pain will improve within 2 to 3 days with rest and mild pain medicine.  HOME CARE INSTRUCTIONS   If antibiotics were prescribed, take the full amount even if you start to feel better.   For the next few days, avoid physical activities that bring on chest pain. Continue physical activities as  directed.   Do not smoke cigarettes or drink alcohol until your symptoms are gone.   Only take over-the-counter or prescription medicine for pain, discomfort, or fever as directed by your caregiver.   Follow your caregiver's suggestions for further testing if your chest pain does not go away.   Keep any follow-up appointments you made. If you do not go to an appointment, you could develop lasting (chronic) problems with pain. If there is any problem keeping an appointment, you must call to reschedule.  SEEK MEDICAL CARE IF:   You think you are having problems from the medicine you are taking. Read your medicine instructions carefully.   Your chest pain does not go away, even after treatment.   You develop a rash with blisters on your chest.  SEEK IMMEDIATE MEDICAL CARE IF:   You have increased chest pain or pain that spreads to your arm, neck, jaw, back, or belly (abdomen).   You develop shortness of breath, an increasing cough, or you are coughing up blood.   You have severe back or abdominal pain, feel sick to your stomach (nauseous) or throw up (vomit).   You develop severe weakness, fainting, or chills.   You have an oral temperature above 102 F (38.9 C), not controlled by medicine.  THIS IS AN EMERGENCY. Do not wait to see if the pain will go away. Get medical help at once. Call your local emergency services (911 in [place] Do not drive yourself to   the hospital. MAKE SURE YOU:   Understand these instructions.   Will watch your condition.   Will get help right away if you are not doing well or get worse.  Document Released: 12/16/2004 Document Revised: 11/18/2010 Document Reviewed: 10/12/2007 [employer] Patient Information 2012 [employer]. 

## 2011-05-18 HISTORY — PX: US ECHOCARDIOGRAPHY: HXRAD669

## 2011-05-19 HISTORY — PX: CARDIOVASCULAR STRESS TEST: SHX262

## 2011-08-18 ENCOUNTER — Encounter (HOSPITAL_COMMUNITY): Payer: Self-pay

## 2011-08-18 ENCOUNTER — Emergency Department (HOSPITAL_COMMUNITY)
Admission: EM | Admit: 2011-08-18 | Discharge: 2011-08-18 | Disposition: A | Discharge status: Home / Self Care | Payer: Self-pay | Attending: Emergency Medicine | Admitting: Emergency Medicine

## 2011-08-18 ENCOUNTER — Emergency Department (HOSPITAL_COMMUNITY): Payer: Self-pay

## 2011-08-18 ENCOUNTER — Other Ambulatory Visit: Payer: Self-pay

## 2011-08-18 DIAGNOSIS — I10 Essential (primary) hypertension: Secondary | ICD-10-CM | POA: Insufficient documentation

## 2011-08-18 DIAGNOSIS — R079 Chest pain, unspecified: Secondary | ICD-10-CM | POA: Insufficient documentation

## 2011-08-18 DIAGNOSIS — R000 Tachycardia, unspecified: Secondary | ICD-10-CM | POA: Insufficient documentation

## 2011-08-18 HISTORY — DX: Gastro-esophageal reflux disease without esophagitis: K21.9

## 2011-08-18 HISTORY — DX: Nonrheumatic mitral (valve) insufficiency: I34.0

## 2011-08-18 LAB — POCT I-STAT, CHEM 8
Creatinine, Ser: 0.7 mg/dL (ref 0.50–1.10)
Glucose, Bld: 93 mg/dL (ref 70–99)
Hemoglobin: 12.9 g/dL (ref 12.0–15.0)
TCO2: 23 mmol/L (ref 0–100)

## 2011-08-18 LAB — CBC
HCT: 36.4 % (ref 36.0–46.0)
Hemoglobin: 12.3 g/dL (ref 12.0–15.0)
MCH: 29.5 pg (ref 26.0–34.0)
MCV: 87.3 fL (ref 78.0–100.0)
RBC: 4.17 MIL/uL (ref 3.87–5.11)

## 2011-08-18 LAB — CARDIAC PANEL(CRET KIN+CKTOT+MB+TROPI)
CK, MB: 2.1 ng/mL (ref 0.3–4.0)
Total CK: 155 U/L (ref 7–177)

## 2011-08-18 LAB — DIFFERENTIAL
Eosinophils Absolute: 0.3 10*3/uL (ref 0.0–0.7)
Eosinophils Relative: 4 % (ref 0–5)
Lymphs Abs: 3.3 10*3/uL (ref 0.7–4.0)
Monocytes Relative: 10 % (ref 3–12)

## 2011-08-18 MED ORDER — SODIUM CHLORIDE 0.9 % IV SOLN: Freq: Once | INTRAVENOUS | Status: DC

## 2011-08-18 NOTE — ED Provider Notes (Signed)
History     CSN: [id number]  Arrival date & time 08/18/11  0105   First MD Initiated Contact with Patient 08/18/11 0222      Chief Complaint  Patient presents with  . Chest Pain    (Consider location/radiation/quality/duration/timing/severity/associated sxs/prior treatment) HPI Comments: 41 year old female with a history of hypertension, GERD, mild mitral valve regurgitation who presents with complaint of chest pain. She states this started just prior to arrival while she was giving her child a bath. It feels like a pressure, has gradually gotten worse, totally improved after getting aspirin and nitroglycerin on the ambulance and is currently resolved. She had no associated shortness of breath, no coughing no fevers no back pain no lower extremity pain or swelling, no travel, no trauma. 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 She had a negative nuclear stress test which she reports was performed at [facility] approximately one month ago. She's been having similar symptoms on several different episodes, this is the third episode. She does have a very strong family history of heart disease including 2 siblings that have had serious heart disease including a brother that died at a young age from a massive heart attack.  She states that her chest pain gets worse when she takes a deep breath. She is on oral contraceptive therapy.  Oral contraceptives and a pulse of 105 on my exam are her only risk factor for pulmonary embolism  Patient is a 41 y.o. female presenting with chest pain. The history is provided by the patient.  Chest Pain     Past Medical History  Diagnosis Date  . Hypertension   . Mitral valve regurgitation   . Gastroesophageal reflux     No past surgical history on file.  No family history on file.  History    Substance Use Topics  . Smoking status: Not on file  . Smokeless tobacco: Not on file  . Alcohol Use: No    OB History    Grav Para Term Preterm Abortions TAB SAB Ect Mult Living                  Review of Systems  Cardiovascular: Positive for chest pain.  All other systems reviewed and are negative.    Allergies  Review of patient's allergies indicates no known allergies.  Home Medications   Current Outpatient Rx  Name Route Sig Dispense Refill  . ASPIRIN EC 81 MG PO TBEC Oral Take 81 mg by mouth daily as needed. For pain.    Marland Kitchen CETIRIZINE HCL 10 MG PO TABS Oral Take 10 mg by mouth daily.    . ADULT MULTIVITAMIN W/MINERALS CH Oral Take 1 tablet by mouth daily.    . NORETHINDRONE ACET-ETHINYL EST 1.5-30 MG-MCG PO TABS Oral Take 1 tablet by mouth daily.    Marland Kitchen PANTOPRAZOLE SODIUM 40 MG PO TBEC Oral Take 40 mg by mouth daily.      BP 126/82  Pulse 93  Temp(Src) 98.3 F (36.8 C) (Oral)  Resp 21  SpO2 99%  LMP 07/26/2011  Physical Exam  Nursing note and vitals reviewed. Constitutional: She appears well-developed and well-nourished. No distress.  HENT:  Head: Normocephalic and atraumatic.  Mouth/Throat: Oropharynx is clear and moist. No oropharyngeal exudate.  Eyes: Conjunctivae and EOM are normal. Pupils are equal, round,  and reactive to light. Right eye exhibits no discharge. Left eye exhibits no discharge. No scleral icterus.  Neck: Normal range of motion. Neck supple. No JVD present. No thyromegaly present.  Cardiovascular: Regular rhythm, normal heart sounds and intact distal pulses.  Exam reveals no gallop and no friction rub.   No murmur heard.      Sinus tachycardia at 105, strong peripheral pulses at the radial arteries  Pulmonary/Chest: Effort normal and breath sounds normal. No respiratory distress. She has no wheezes. She has no rales. She exhibits no tenderness.  Abdominal: Soft. Bowel sounds are normal. She exhibits no distension and no mass. There is no  tenderness.  Musculoskeletal: Normal range of motion. She exhibits no edema and no tenderness.  Lymphadenopathy:    She has no cervical adenopathy.  Neurological: She is alert. Coordination normal.  Skin: Skin is warm and dry. No rash noted. No erythema.  Psychiatric: She has a normal mood and affect. Her behavior is normal.    ED Course  Procedures (including critical care time)  ED ECG REPORT   Date: 08/18/2011   Rate: 92  Rhythm: normal sinus rhythm  QRS Axis: normal  Intervals: normal  ST/T Wave abnormalities: normal  Conduction Disutrbances:none  Narrative Interpretation:   Old EKG Reviewed: Compared with February 23rd 2013, no significant changes.   Labs Reviewed  DIFFERENTIAL - Abnormal; Notable for the following:    Neutrophils Relative 31 (*)    Lymphocytes Relative 55 (*)    All other components within normal limits  D-DIMER, QUANTITATIVE  CARDIAC PANEL(CRET KIN+CKTOT+MB+TROPI)  PROTIME-INR  APTT  CBC  POCT I-STAT, CHEM 8  POCT I-STAT TROPONIN I   Dg Chest Port 1 View  08/18/2011  *RADIOLOGY REPORT*  Clinical Data: Chest pain.  Hypertension.  PORTABLE CHEST - 1 VIEW  Comparison: 05/15/2011  Findings: The heart size and pulmonary vascularity are normal. The lungs appear clear and expanded without focal air space disease or consolidation. No blunting of the costophrenic angles.  No pneumothorax.  No significant change since previous study.  IMPRESSION: No evidence of active pulmonary disease.  Original Report Authenticated By: Marlon Pel, M.D.     1. Chest pain       MDM  EKG is normal, patient's exam is reassuring, will check a d-dimer to rule out pulmonary embolism, has had recent stress test confirming very unlikely to have blockages causing her symptoms.   D-dimer, cardiac enzymes, chest x-ray all negative. I have discussed these findings with the patient who remains chest pain-free with normal vital signs. I doubt cardiac ischemia given the  patient's history of recent normal stress test and atypical chest pain. Will discharge home. She agrees to followup with her family doctor this week.  Vida Roller, MD 08/18/11 [phone]

## 2011-08-18 NOTE — ED Notes (Signed)
Pt denies any pain or questions upon discharge. 

## 2011-08-18 NOTE — Discharge Instructions (Signed)
Your caregiver has diagnosed you as having chest pain that is nonspecific for one problem. This means that after looking at you and examining you and ordering tests (such as blood work, chest x-rays and EKG), your caregiver does not believe that the problem is serious enough to need watching in the hospital. This judgment is often made after testing shows no acute heart attack and you are at low risk for sudden acute heart condition. Chest pain comes from many different causes.  Seek immediate medical attention if:   You have severe chest pain, especially if the pain is crushing or pressure-like and spreads to the arms, back, neck, or jaw, or if you have sweating, nausea, shortness of breath. This is an emergency. Don't wait to see if the pain will go away. Get medical help at once. Call 911 immediately. Do not drive herself to the hospital.   Your chest pain gets worse and does not go away with rest.   You have an attack of chest pain lasting longer than usual, despite rest and treatment with the medications your caregiver has prescribed   You awaken from sleep with chest pain or shortness of breath.   You feel faint or dizzy   You have chest pain not typical of your usual pain for which you originally saw your caregiver.  You must have a repeat evaluation within 24 hours for a recheck of your heart.  Please call your doctor this morning to schedule this appointment. If you do not have a family doctor, please see the list of doctors below.  RESOURCE GUIDE  Dental Problems  Patients with Medicaid: [facility]                     [facility] [address]                                           [address] Phone:  [phone]                                                  Phone:  [phone]  If unable to pay or uninsured, contact:  [facility] or [facility] to become qualified for the adult dental clinic.  Chronic Pain  Problems Contact [facility]  [phone] Patients need to be referred by their primary care doctor.  Insufficient Money for Medicine Contact [employer]:  call "211" or [facility] 271-5999.  No Primary Care Doctor Call [facility]  832-8000 Other agencies that provide inexpensive medical care    [facility]  [phone]    [facility]  [phone]    [facility]  [phone]    [facility] Clinic  832-4777    [facility]  [phone]    [facility]  [phone]  Psychological Services [facility]  [phone] [facility]  [phone] [facility]   800 853-5163 (emergency services 641-4993)  Substance Abuse Resources [facility]  336-882-2125 [facility] 336-784-9470 [facility] 336-285-9073 [facility] 336-845-3988 Residential & Outpatient Substance Abuse Program  800-659-3381  Abuse/Neglect [place] Child Abuse Hotline (336) 641-3795 [place] [facility] Hotline 800-378-5315 (After Hours)  Emergency Shelter [facility] (336) 271-5985  [facility] (336) 275-9566 [facility] (704) 372-4663  MRSA Hotline #:   832-7006    [place] [facility]  [facility] [place]     [employer]                          [facility] [address] [place]                       [address]      [address]  [place]                                                [place]                            [place] Phone:  [phone]                                   Phone:  [phone]                 Phone:  [phone]  [facility] Phone:  [phone]  [place] Child Abuse Hotline (336) 342-1394 (336) 342-3537 (After Hours)    

## 2011-08-18 NOTE — ED Notes (Signed)
Pt from home with left sided chest pain, non raidiating per EMS. Pt denies any other s/s, hx acid reflux. Pt was given nitro SL x 3 by EMS, pt took aspirin prior to EMS arrival. Pt denies any chest pain at this time.

## 2012-02-15 ENCOUNTER — Ambulatory Visit: Payer: Self-pay | Admitting: Physician Assistant

## 2012-02-15 VITALS — BP 125/85 | HR 92 | Temp 98.5°F | Resp 18 | Ht 63.0 in | Wt 123.0 lb

## 2012-02-15 DIAGNOSIS — R05 Cough: Secondary | ICD-10-CM

## 2012-02-15 DIAGNOSIS — R0602 Shortness of breath: Secondary | ICD-10-CM

## 2012-02-15 DIAGNOSIS — J329 Chronic sinusitis, unspecified: Secondary | ICD-10-CM

## 2012-02-15 DIAGNOSIS — R059 Cough, unspecified: Secondary | ICD-10-CM

## 2012-02-15 MED ORDER — AMOXICILLIN 875 MG PO TABS: 875.0000 mg | ORAL_TABLET | Freq: Two times a day (BID) | ORAL | Status: DC

## 2012-02-15 MED ORDER — GUAIFENESIN-CODEINE 100-10 MG/5ML PO SOLN: 5.0000 mL | Freq: Three times a day (TID) | ORAL | Status: DC | PRN

## 2012-02-15 MED ORDER — ALBUTEROL SULFATE HFA 108 (90 BASE) MCG/ACT IN AERS: 2.0000 | INHALATION_SPRAY | RESPIRATORY_TRACT | Status: DC | PRN

## 2012-02-15 NOTE — Progress Notes (Signed)
  Subjective:    Patient ID: Sharon Mendoza, female    DOB: 04/05/1970, 41 y.o.   MRN: [id number]  HPI 41 year old female presents with 2 week history of cough, nasal congestion, postnasal drainage, and some shortness of breath. States symptoms have persisted despite taking Deslym, Zyrtec, and Nyquil.  No known history of asthma.  Denies fever, chills, nausea, vomiting, or headache.  She is otherwise healthy with no other complaints today.     Review of Systems  Constitutional: Negative for fever and chills.  HENT: Positive for congestion, rhinorrhea and postnasal drip. Negative for sore throat, neck pain and neck stiffness.   Respiratory: Positive for cough.   Gastrointestinal: Negative for nausea, vomiting and abdominal pain.  Neurological: Negative for dizziness and headaches.  All other systems reviewed and are negative.       Objective:   Physical Exam  Constitutional: She is oriented to person, place, and time. She appears well-developed and well-nourished.  HENT:  Head: Normocephalic and atraumatic.  Right Ear: Hearing, tympanic membrane, external ear and ear canal normal.  Left Ear: Hearing, tympanic membrane, external ear and ear canal normal.  Mouth/Throat: Uvula is midline, oropharynx is clear and moist and mucous membranes are normal. No oropharyngeal exudate.  Eyes: Conjunctivae normal are normal.  Neck: Normal range of motion.  Cardiovascular: Normal rate, regular rhythm and normal heart sounds.   Pulmonary/Chest: Effort normal and breath sounds normal.  Lymphadenopathy:    She has no cervical adenopathy.  Neurological: She is alert and oriented to person, place, and time.  Psychiatric: She has a normal mood and affect. Her behavior is normal. Judgment and thought content normal.          Assessment & Plan:   1. Cough  guaiFENesin-codeine 100-10 MG/5ML syrup  2. Sinusitis  amoxicillin (AMOXIL) 875 MG tablet  3. Shortness of breath  albuterol (PROVENTIL  HFA;VENTOLIN HFA) 108 (90 BASE) MCG/ACT inhaler   Amoxicillin 875 mg bid Codeine qhs prn cough Albuterol as needed for shortness of breath Follow up if symptoms worsen or fail to improve

## 2012-06-20 ENCOUNTER — Emergency Department (HOSPITAL_COMMUNITY): Payer: BC Managed Care – PPO

## 2012-06-20 ENCOUNTER — Encounter: Payer: Self-pay | Admitting: Gynecology

## 2012-06-20 ENCOUNTER — Encounter (HOSPITAL_COMMUNITY): Payer: Self-pay | Admitting: Emergency Medicine

## 2012-06-20 ENCOUNTER — Emergency Department (HOSPITAL_COMMUNITY)
Admission: EM | Admit: 2012-06-20 | Discharge: 2012-06-20 | Disposition: A | Discharge status: Home / Self Care | Payer: BC Managed Care – PPO | Attending: Emergency Medicine | Admitting: Emergency Medicine

## 2012-06-20 DIAGNOSIS — I10 Essential (primary) hypertension: Secondary | ICD-10-CM | POA: Insufficient documentation

## 2012-06-20 DIAGNOSIS — R5383 Other fatigue: Secondary | ICD-10-CM | POA: Insufficient documentation

## 2012-06-20 DIAGNOSIS — R5381 Other malaise: Secondary | ICD-10-CM | POA: Insufficient documentation

## 2012-06-20 DIAGNOSIS — R071 Chest pain on breathing: Secondary | ICD-10-CM | POA: Insufficient documentation

## 2012-06-20 DIAGNOSIS — Z79899 Other long term (current) drug therapy: Secondary | ICD-10-CM | POA: Insufficient documentation

## 2012-06-20 DIAGNOSIS — Z8679 Personal history of other diseases of the circulatory system: Secondary | ICD-10-CM | POA: Insufficient documentation

## 2012-06-20 DIAGNOSIS — K219 Gastro-esophageal reflux disease without esophagitis: Secondary | ICD-10-CM | POA: Insufficient documentation

## 2012-06-20 DIAGNOSIS — R079 Chest pain, unspecified: Secondary | ICD-10-CM

## 2012-06-20 DIAGNOSIS — Z3202 Encounter for pregnancy test, result negative: Secondary | ICD-10-CM | POA: Insufficient documentation

## 2012-06-20 DIAGNOSIS — Z7982 Long term (current) use of aspirin: Secondary | ICD-10-CM | POA: Insufficient documentation

## 2012-06-20 DIAGNOSIS — R209 Unspecified disturbances of skin sensation: Secondary | ICD-10-CM | POA: Insufficient documentation

## 2012-06-20 LAB — BASIC METABOLIC PANEL
BUN: 10 mg/dL (ref 6–23)
Creatinine, Ser: 0.77 mg/dL (ref 0.50–1.10)
GFR calc Af Amer: >90 mL/min (ref >90)
GFR calc non Af Amer: >90 mL/min (ref >90)
Glucose, Bld: 75 mg/dL (ref 70–99)
Potassium: 4.1 mEq/L (ref 3.5–5.1)

## 2012-06-20 LAB — CBC
HCT: 39.1 % (ref 36.0–46.0)
Hemoglobin: 13.2 g/dL (ref 12.0–15.0)
MCH: 29.1 pg (ref 26.0–34.0)
MCHC: 33.8 g/dL (ref 30.0–36.0)
MCV: 86.3 fL (ref 78.0–100.0)
RDW: 14.6 % (ref 11.5–15.5)

## 2012-06-20 LAB — TROPONIN I: Troponin I: <0.3 ng/mL (ref <0.30)

## 2012-06-20 MED ORDER — ASPIRIN 81 MG PO CHEW
324.0000 mg | CHEWABLE_TABLET | Freq: Once | ORAL | Status: AC
  Administered 2012-06-20: 324 mg via ORAL
  Filled 2012-06-20: qty 4
  Filled 2012-06-20: qty 2

## 2012-06-20 MED ORDER — PANTOPRAZOLE SODIUM 20 MG PO TBEC: 40.0000 mg | DELAYED_RELEASE_TABLET | Freq: Two times a day (BID) | ORAL | Status: DC

## 2012-06-20 NOTE — ED Notes (Signed)
Pt c/o left sided CP x 3 days with left sided weakness starting yesterday; no neuro deficits noted

## 2012-06-20 NOTE — ED Provider Notes (Signed)
History     CSN: [id number]  Arrival date & time 06/20/12  1040   First MD Initiated Contact with Patient 06/20/12 1124      Chief Complaint  Patient presents with  . Chest Pain  . Weakness    (Consider location/radiation/quality/duration/timing/severity/associated sxs/prior treatment) HPI Comments: Patient with hx HTN, mitral valve regurg, GERD, presents with 5 days of intermittent left chest pain and left arm heaviness.  Reports symptoms began 5 days ago with excessive belching and burping, 3 days ago she developed left chest pain that continued intermittently until this morning.  Left arm heaviness began two days ago.  Chest pain is described as a dull ache, initially radiating into the left arm, worse with exertion.  3 days ago it lasted the entire day,  since yesterday it lasts 5 minutes at a time.  Does not improve with rest.  Not pleuritic.  Pt took an aspirin yesterday with some relief. Pt awoke three times during the night last night with left arm discomfort, described as weakness, numbness, heaviness.  Denies fever, chills, cough, SOB, lightheaded/dizziness, nausea, upper respiratory symptoms, abdominal pain.  Pt has hx GERD states this is nothing like her GERD.  Pt had dobutamine stress test March 2013 with good results.  Cardiologist is Dr Welton Flakes in [place].  Pt has family hx of CAD: sister had MI at 21, brother died of MI at 44.    Patient is a 42 y.o. female presenting with chest pain and weakness. The history is provided by the patient.  Chest Pain Associated symptoms: numbness and weakness   Associated symptoms: no abdominal pain, no cough, no fever, no nausea, no shortness of breath and not vomiting   Weakness Associated symptoms include chest pain, numbness and weakness. Pertinent negatives include no abdominal pain, chills, congestion, coughing, fever, nausea, sore throat or vomiting.    Past Medical History  Diagnosis Date  . Hypertension   . Mitral valve  regurgitation   . Gastroesophageal reflux     History reviewed. No pertinent past surgical history.  Family History  Problem Relation Age of Onset  . Hypertension Mother   . Liver disease Mother   . Cancer Father   . Heart disease Brother   . Asthma Son     History  Substance Use Topics  . Smoking status: Never Smoker   . Smokeless tobacco: Not on file  . Alcohol Use: No    OB History   Grav Para Term Preterm Abortions TAB SAB Ect Mult Living                  Review of Systems  Constitutional: Negative for fever and chills.  HENT: Negative for congestion and sore throat.   Respiratory: Negative for cough and shortness of breath.   Cardiovascular: Positive for chest pain.  Gastrointestinal: Negative for nausea, vomiting, abdominal pain and diarrhea.  Neurological: Positive for weakness and numbness.    Allergies  Review of patient's allergies indicates no known allergies.  Home Medications   Current Outpatient Rx  Name  Route  Sig  Dispense  Refill  . albuterol (PROVENTIL HFA;VENTOLIN HFA) 108 (90 BASE) MCG/ACT inhaler   Inhalation   Inhale 2 puffs into the lungs every 4 (four) hours as needed for wheezing (cough, shortness of breath or wheezing.).   1 Inhaler   1   . amoxicillin (AMOXIL) 875 MG tablet   Oral   Take 1 tablet (875 mg total) by mouth 2 (two) times daily.  20 tablet   0   . aspirin EC 81 MG tablet   Oral   Take 81 mg by mouth daily as needed. For pain.         . cetirizine (ZYRTEC) 10 MG tablet   Oral   Take 10 mg by mouth daily.         Marland Kitchen guaiFENesin-codeine 100-10 MG/5ML syrup   Oral   Take 5 mLs by mouth 3 (three) times daily as needed for cough.   120 mL   0   . Multiple Vitamin (MULITIVITAMIN WITH MINERALS) TABS   Oral   Take 1 tablet by mouth daily.         . Norethindrone Acetate-Ethinyl Estradiol (LOESTRIN 1.5/30, 21,) 1.5-30 MG-MCG tablet   Oral   Take 1 tablet by mouth daily.         . pantoprazole  (PROTONIX) 40 MG tablet   Oral   Take 40 mg by mouth daily.           BP 123/82  Pulse 91  Temp(Src) 98 F (36.7 C) (Oral)  Resp 18  SpO2 98%  LMP 06/17/2012  Physical Exam  Nursing note and vitals reviewed. Constitutional: She appears well-developed and well-nourished. No distress.  HENT:  Head: Normocephalic and atraumatic.  Neck: Neck supple.  Cardiovascular: Normal rate and regular rhythm.   Pulmonary/Chest: Effort normal and breath sounds normal. No respiratory distress. She has no wheezes. She has no rales.  Abdominal: Soft. She exhibits no distension. There is no tenderness. There is no rebound and no guarding.  Musculoskeletal: Normal range of motion.  Upper extremities:  Strength 5/5, sensation intact, distal pulses intact.     Neurological: She is alert.  No pronator drift.   Skin: She is not diaphoretic.    ED Course  Procedures (including critical care time)  Labs Reviewed  CBC  BASIC METABOLIC PANEL  TROPONIN I  TROPONIN I  POCT I-STAT TROPONIN I  POCT PREGNANCY, URINE   Dg Chest 2 View  06/20/2012  *RADIOLOGY REPORT*  Clinical Data: Chest pain, weakness, hypertension  CHEST - 2 VIEW  Comparison:  08/18/2011, 05/15/2011  Findings:  The heart size and mediastinal contours are within normal limits.  Both lungs are clear.  The visualized skeletal structures are unremarkable.  IMPRESSION: No active cardiopulmonary disease.   Original Report Authenticated By: Judie Petit. Miles Costain, M.D.    Discussed patient and plan with Dr Lynelle Doctor.  I have attempted to call patient's cardiologist x 3 and number does not work.  I have paged through secretary to attempt contact.    2:58 PM Cardiologist has been paged and I have not yet heard back.  I have asked Shayne Alken, Diplomatic Services operational officer, to repage.  3:10 PM I have spoken with Dr Welton Flakes who agrees with second marker and d/c home if negative.  Will see pt in his office at 9am on Thursday, but pt can walk in to see PA tomorrow if she has any problems.   Suggests changing protonix to 40mg  BID, thinks pt's symptoms may be related to reflux.    Date: 06/20/2012  Rate: 86  Rhythm: normal sinus rhythm  QRS Axis: normal  Intervals: normal  ST/T Wave abnormalities: normal  Conduction Disutrbances: none  Narrative Interpretation:   Old EKG Reviewed: No significant changes noted    1. Chest pain     MDM  Pt with left sided chest discomfort and left arm heaviness that has been intermittent for several days.  Atypical in  that the pain was initially constant for an entire day, then became intermittent yesterday.  No associated symptoms of lightheadedness, SOB, nausea.  Pt has had a normal stress myoview last spring.  Concern due to strong family hx CAD. I have spoken with her cardiologist who agrees with d/c home after second negative marker and follow up in his office.  Discussed all results with patient.  Pt given return precautions.  Pt verbalizes understanding and agrees with plan.          Trixie Dredge, PA-C 06/20/12 1814

## 2012-06-22 ENCOUNTER — Encounter: Payer: Self-pay | Admitting: Gynecology

## 2012-06-22 ENCOUNTER — Ambulatory Visit (INDEPENDENT_AMBULATORY_CARE_PROVIDER_SITE_OTHER): Payer: BC Managed Care – PPO | Admitting: Gynecology

## 2012-06-22 VITALS — BP 120/60 | Ht 63.5 in | Wt 120.0 lb

## 2012-06-22 DIAGNOSIS — Z01419 Encounter for gynecological examination (general) (routine) without abnormal findings: Secondary | ICD-10-CM

## 2012-06-22 DIAGNOSIS — D259 Leiomyoma of uterus, unspecified: Secondary | ICD-10-CM | POA: Insufficient documentation

## 2012-06-22 DIAGNOSIS — N920 Excessive and frequent menstruation with regular cycle: Secondary | ICD-10-CM | POA: Insufficient documentation

## 2012-06-22 DIAGNOSIS — Z0000 Encounter for general adult medical examination without abnormal findings: Secondary | ICD-10-CM

## 2012-06-22 DIAGNOSIS — N946 Dysmenorrhea, unspecified: Secondary | ICD-10-CM | POA: Insufficient documentation

## 2012-06-22 LAB — POCT URINALYSIS DIPSTICK
Clarity, UA: NORMAL
Urobilinogen, UA: NEGATIVE

## 2012-06-22 NOTE — Patient Instructions (Addendum)

## 2012-06-22 NOTE — ED Provider Notes (Signed)
Medical screening examination/treatment/procedure(s) were performed by non-physician practitioner and as supervising physician I was immediately available for consultation/collaboration.   Angeliz Settlemyre R Shawnn Bouillon, MD 06/22/12 0716 

## 2012-06-22 NOTE — Progress Notes (Signed)
42 y.o. SingleAfrican American female   G1P1 here for annual exam.  Pt on OCP for primary dysmenorrhea and menorrhagia.  Pt very happy on oc.  She reports history of hypertension in past, was on enalapril, but was d/c'd 2y ago.  Pt is seen by cardiologistDr.Khan Van, [phone], pt reports chest pain 6d ago, went to ER normal traponin and ekg, saw cardiologist this am, who cleared her.  Pt with strong family history of heart attack and aneurism-cerebral.  Pt would like to continue her oc.  Pt has coronary [facility]A [facility].  Patient's Last Menstrual Period: 02/22/12          Sexually active: no  The current method of family planning is OCP (estrogen/progesterone).    Exercising: walking some Last mammogram:  2 in a half years ago Last pap smear: 2011 History of abnormal pap: no Smoking: no Alcohol: no Last colonoscopy:5 years ago Last Bone Density:   none Last tetanus shot: up to date Last cholesterol check: 1 year and 3 months ago Self Breast Exams: yes  Hgb:   12.9             Urine: Tr. Leuks    Health Maintenance  Topic Date Due  . Influenza Vaccine  11/20/1969  . Tetanus/tdap  01/17/1988  . Pap Smear  04/22/2013    Family History  Problem Relation Age of Onset  . Hypertension Mother   . Liver disease Mother   . Cancer Father     colon  . Heart disease Brother   . Aneurysm Brother   . Heart attack Brother   . Breast cancer Brother   . Asthma Son   . Aneurysm Sister   . Aneurysm Brother   . Aneurysm Sister   . Heart attack Sister   . Aneurysm Sister     Patient Active Problem List  Diagnosis  . Fibroid uterus  . Menorrhagia  . Dysmenorrhea    Past Medical History  Diagnosis Date  . Hypertension   . Mitral valve regurgitation   . Gastroesophageal reflux   . Abnormal pap   . Chest pain radiating to arm     Past Surgical History  Procedure Laterality Date  . Cesarean section  09/2005    Allergies: Review of patient's  allergies indicates no known allergies.  Current Outpatient Prescriptions  Medication Sig Dispense Refill  . aspirin EC 81 MG tablet Take 81 mg by mouth daily as needed. For pain.      . cetirizine (ZYRTEC) 10 MG tablet Take 10 mg by mouth daily.      . lansoprazole (PREVACID) 30 MG capsule Take 30 mg by mouth daily.      . Multiple Vitamin (MULITIVITAMIN WITH MINERALS) TABS Take 1 tablet by mouth daily.      . Norethindrone Acetate-Ethinyl Estradiol (LOESTRIN 1.5/30, 21,) 1.5-30 MG-MCG tablet Take 1 tablet by mouth daily.      . pantoprazole (PROTONIX) 20 MG tablet Take 2 tablets (40 mg total) by mouth 2 (two) times daily.  30 tablet  0   No current facility-administered medications for this visit.    ROS: Pertinent items are noted in HPI.  Exam:    BP 120/60  Ht 5' 3.5" (1.613 m)  Wt 120 lb (54.432 kg)  BMI 20.92 kg/m2  LMP 03/03/2012   Wt Readings from Last 3 Encounters:  06/22/12 120 lb (54.432 kg)  02/15/12 123 lb (55.792 kg)  05/15/11 118 lb (53.524 kg)  Ht Readings from Last 3 Encounters:  06/22/12 5' 3.5" (1.613 m)  02/15/12 5\' 3"  (1.6 m)  05/15/11 5\' 3"  (1.6 m)    General appearance: alert, cooperative and appears stated age Head: Normocephalic, without obvious abnormality, atraumatic Neck: no adenopathy, supple, symmetrical, trachea midline and thyroid not enlarged, symmetric, no tenderness/mass/nodules Lungs: clear to auscultation bilaterally Breasts: Inspection negative, bilateral nipple retraction-unchanged, no dimpling, No nipple discharge or bleeding, No axillary or supraclavicular adenopathy, Normal to palpation without dominant masses Heart: regular rate and rhythm Abdomen: soft, non-tender; bowel sounds normal; no masses,  no organomegaly Extremities: extremities normal, atraumatic, no cyanosis or edema Skin: Skin color, texture, turgor normal. No rashes or lesions Lymph nodes: Cervical, supraclavicular, and axillary nodes normal. No abnormal inguinal  nodes palpated Neurologic: Grossly normal   Pelvic: External genitalia:  no lesions              Urethra:  normal appearing urethra with no masses, tenderness or lesions              Bartholins and Skenes: normal                 Vagina: normal appearing vagina with normal color and discharge, no lesions              Cervix: normal appearance, friable with PAP              Pap taken: yes        Bimanual Exam:  Uterus:  uterus is enlarged, irregular, nontender                                      Adnexa: normal adnexa in size, nontender and no masses                                      Rectovaginal: Confirms                                      Anus:  normal sphincter tone, no lesions  A: normal gyn exam  History of myomectomy-ultrasound to evaluate uterus Strong family history of both myocardial infarction and cerebral aneurism,     P: mammogram overdue pap smear with HR HPV return annually or prn   strongly recommend discontinuing estrogen contraception, suggest Mirena IUD, explanon or progestin only oc.  No refill on oc given, will follow up recommendations from cardiologist, MRA tomorrow and further discuss at ultrasound     An After Visit Summary was printed and given to the patient.

## 2012-06-23 HISTORY — PX: CT CORONARY CA SCORING: HXRAD806

## 2012-06-26 LAB — HEMOGLOBIN, FINGERSTICK: Hemoglobin, fingerstick: 12.9 g/dL (ref 12.0–16.0)

## 2012-06-28 ENCOUNTER — Ambulatory Visit (INDEPENDENT_AMBULATORY_CARE_PROVIDER_SITE_OTHER): Payer: BC Managed Care – PPO

## 2012-06-28 ENCOUNTER — Ambulatory Visit (INDEPENDENT_AMBULATORY_CARE_PROVIDER_SITE_OTHER): Payer: Managed Care, Other (non HMO) | Admitting: Gynecology

## 2012-06-28 ENCOUNTER — Other Ambulatory Visit: Payer: Self-pay | Admitting: Gynecology

## 2012-06-28 DIAGNOSIS — N946 Dysmenorrhea, unspecified: Secondary | ICD-10-CM

## 2012-06-28 DIAGNOSIS — D259 Leiomyoma of uterus, unspecified: Secondary | ICD-10-CM

## 2012-06-28 DIAGNOSIS — N920 Excessive and frequent menstruation with regular cycle: Secondary | ICD-10-CM

## 2012-06-28 DIAGNOSIS — N852 Hypertrophy of uterus: Secondary | ICD-10-CM

## 2012-06-28 DIAGNOSIS — Z0000 Encounter for general adult medical examination without abnormal findings: Secondary | ICD-10-CM

## 2012-06-28 LAB — CBC
HCT: 40.4 % (ref 36.0–46.0)
Hemoglobin: 13.5 g/dL (ref 12.0–15.0)
MCH: 29 pg (ref 26.0–34.0)
MCHC: 33.4 g/dL (ref 30.0–36.0)
RDW: 14.5 % (ref 11.5–15.5)

## 2012-06-28 LAB — LIPID PANEL
LDL Cholesterol: 100 mg/dL — ABNORMAL HIGH (ref 0–99)
VLDL: 20 mg/dL (ref 0–40)

## 2012-06-28 NOTE — Patient Instructions (Signed)
Total Laparoscopic Hysterectomy A total laparoscopic hysterectomy is a minimally invasive surgery to remove your uterus and cervix. This surgery is performed by making several small cuts (incisions) in your abdomen. It can also be done with a thin, lighted tube (laparoscope) inserted into 2 small incisions in the lower abdomen. Your fallopian tubes and ovaries can be removed (bilateral salpingo-oopherectomy) during this surgery as well.If a total laparoscopic hysterectomy is started and it is not safe to continue, the laparoscopic surgery will be converted to an open abdominal surgery. You will not have menstrual periods or be able to get pregnant after having this surgery. If a bilateral salpingo-oopherectomy was performed before menopause, you will go through a sudden (abrupt) menopause. This can be helped with hormone medicines. Benefits of minimally invasive surgery include:  Less pain.  Less risk of blood loss.  Less risk of infection.  Quicker return to normal activities.  Usually a 1 night stay in the hospital.  Overall patient satisfaction. LET YOUR CAREGIVER KNOW ABOUT:  Any history of abnormal Pap tests.  Allergies to food or medicine.  Medicines taken, including vitamins, herbs, eyedrops, over-the-counter medicines, and creams.  Use of steroids (by mouth or creams).  Previous problems with anesthetics or numbing medicines.  History of bleeding problems or blood clots.  Previous surgery.  Other health problems, including diabetes and kidney problems.  Desire for future fertility.  Any infections or colds you may have developed.  Symptoms of irregular or heavy periods, weight loss, or urinary or bowel changes. RISKS AND COMPLICATIONS   Bleeding.  Blood clots in the legs or lung.  Infection.  Injury to surrounding organs.  Problems with anesthesia.  Early menopause symptoms (hot flashes, night sweats, insomnia).  Risk of conversion to an open abdominal  incision. BEFORE THE PROCEDURE  Ask your caregiver about changing or stopping your regular medicines.  Do not take aspirin or blood thinners (anticoagulants) for 1 week before the surgery, or as told by your caregiver.  Do not eat or drink anything for 8 hours before the surgery, or as told by your caregiver.  Quit smoking if you smoke.  Arrange for a ride home after surgery and for someone to help you at home during recovery. PROCEDURE   You will be given antibiotic medicine.  An intravenous (IV) line will be placed in your arm. You will be given medicine to make you sleep (general anesthetic).  A gas (carbon dioxide) will be used to inflate your abdomen. This will allow your surgeon to look inside your abdomen, perform your surgery, and treat any other problems found if necessary.  Three or four small incisions (often less than  inch) will be made in your abdomen. One of these incisions will be made in the area of your belly button (navel). The laparoscope will be inserted into the incision. Your surgeon will look through the laparoscope while doing your procedure.  Other surgical instruments will be inserted through the other incisions.  The uterus may be removed through the vagina or cut into small pieces and removed through the small incisions.  Your incisions will be closed. AFTER THE PROCEDURE  The gas will be released from inside your abdomen.  You will be taken to the recovery area where a nurse will watch and check your progress. Once you are awake, stable, and taking fluids well, without other problems, you will return to your room or be allowed to go home.  There is usually minimal discomfort following the surgery because  the incisions are so small.  You will be given pain medicine while you are in the hospital and for when you go home.  Try to have someone with you the first 3 to 5 days after you go home.  Follow up with your surgeon in 2 to 4 weeks after surgery  to evaluate your progress. Document Released: 01/03/2007 Document Revised: 05/31/2011 Document Reviewed: 10/23/2010 [facility] Patient Information 2013 , [place].

## 2012-06-28 NOTE — Progress Notes (Signed)
           Pt here to discuss ultrasound.  Pt s/p multiple myomectomy in past with enlarged uterus on exam.  The ultrasound findings were discussed with the patient. She has >30firoids noted, the largest is 4cm and appears to be intramural/submucosal.  The endometrial cavity is not well visualized due to the multiple fibroids.  There is an echofree simple cyst on the right.   We again stressed that ocp's with her family history and her recent chest pain is not the best contraceptive option or treatment of her dysmenorrhea and menorrhagia.  She will get the results of her cardiac studies tomorrow.  We discussed starting with a progestin IUD to treat her symptoms.  She is aware that its success is related to the cavity with the fibroids.  If that fails, she may consider removal of the uterus. She is agreeable.  No refill on OC's was given at her annual exam.  She was asked to call us with the results of her cardiac studies and recommendations from the cardiologist(-not an Epic MD). She asked, and was given info on hysterectomy. We will get a fasting lipid profile and CBC today. We spent >25 min, >50% face to face discussing fibroids and dysmenorrhea treatments

## 2012-06-30 ENCOUNTER — Encounter: Payer: Self-pay | Admitting: Gynecology

## 2012-07-03 ENCOUNTER — Encounter: Payer: Self-pay | Admitting: Gynecology

## 2012-07-05 ENCOUNTER — Telehealth: Payer: Self-pay | Admitting: *Deleted

## 2012-07-05 NOTE — Telephone Encounter (Signed)
Pt notified of labs and to continue healthy diet and excersise.

## 2012-07-05 NOTE — Telephone Encounter (Signed)
Message copied by Lorraine Lax on Wed Jul 05, 2012  8:55 AM ------      Message from: Douglass Rivers      Created: Thu Jun 29, 2012  8:44 AM       Inform labs normal, con't healthy diet and exercise ------

## 2012-08-11 ENCOUNTER — Telehealth: Payer: Self-pay | Admitting: *Deleted

## 2012-08-11 NOTE — Telephone Encounter (Signed)
Pt states she didn't receive Rx for Junel BC at last aex in 04/2012. Pt states you (TL) requested documentation from her cardiologist clearing her of any heart issues. Please advise. Chart in your office.

## 2012-08-12 ENCOUNTER — Other Ambulatory Visit: Payer: Self-pay | Admitting: Gynecology

## 2012-08-12 MED ORDER — NORETHIN-ETH ESTRAD-FE BIPHAS 1 MG-10 MCG / 10 MCG PO TABS: 1.0000 | ORAL_TABLET | Freq: Every day | ORAL | Status: DC

## 2012-08-12 NOTE — Telephone Encounter (Signed)
Inform, changed to LoLoestrin based on age, ov to check BP in 34m

## 2012-08-15 NOTE — Telephone Encounter (Signed)
LVM for pt to return my call in regards to making a 6 month F/U appt.

## 2012-08-18 NOTE — Telephone Encounter (Signed)
Pt is aware that you switched her to [facility] and that bc is called in to her [employer]; pt didn't have calender in front her says she will c/b and schedule 79month f/u

## 2012-08-21 ENCOUNTER — Telehealth: Payer: Self-pay | Admitting: *Deleted

## 2012-08-21 NOTE — Telephone Encounter (Signed)
Pt is aware of samples and savings card at front desk for her.

## 2012-08-21 NOTE — Telephone Encounter (Signed)
Inform we have a sample for her with a coupon for no more than $25, does that work for her?

## 2012-08-21 NOTE — Telephone Encounter (Signed)
SEE NOTES BELOW. PLEASE ADVISE AND WILL CALL PATIENT. SUE

## 2012-08-21 NOTE — Telephone Encounter (Signed)
pt is requesting a different b/c because the LO LO ESTRIN is $ 98.00

## 2012-08-21 NOTE — Telephone Encounter (Signed)
Pt is aware of samples and savings card is at front desk for her.

## 2012-08-21 NOTE — Telephone Encounter (Signed)
Left message on CB# of samples available and coupon per Dr. Farrel Gobble./

## 2012-12-04 ENCOUNTER — Ambulatory Visit (INDEPENDENT_AMBULATORY_CARE_PROVIDER_SITE_OTHER): Payer: Managed Care, Other (non HMO) | Admitting: Family Medicine

## 2012-12-04 VITALS — BP 112/72 | HR 74 | Temp 99.4°F | Resp 18 | Ht 63.5 in | Wt 119.4 lb

## 2012-12-04 DIAGNOSIS — J209 Acute bronchitis, unspecified: Secondary | ICD-10-CM

## 2012-12-04 MED ORDER — ALBUTEROL SULFATE HFA 108 (90 BASE) MCG/ACT IN AERS: 2.0000 | INHALATION_SPRAY | Freq: Four times a day (QID) | RESPIRATORY_TRACT | Status: DC | PRN

## 2012-12-04 MED ORDER — AZITHROMYCIN 250 MG PO TABS: ORAL_TABLET | ORAL | Status: DC

## 2012-12-04 MED ORDER — HYDROCODONE-HOMATROPINE 5-1.5 MG/5ML PO SYRP: 5.0000 mL | ORAL_SOLUTION | Freq: Three times a day (TID) | ORAL | Status: DC | PRN

## 2012-12-04 NOTE — Progress Notes (Signed)
Patient ID: Sharon Mendoza MRN: [id number], DOB: [birth year], 42 y.o. Date of Encounter: 12/04/2012, 8:37 AM  Primary Physician: Yves Dill, MD  Chief Complaint:  Chief Complaint  Patient presents with  . Nasal Congestion    x 1 week  . Cough    HPI: 42 y.o. year old female presents with a 21 day history of nasal congestion, post nasal drip, sore throat, and cough. Mild sinus pressure. Afebrile. No chills. Nasal congestion thick and green/yellow. Cough is productive of green/yellow sputum and not associated with time of day. Ears feel full, leading to sensation of muffled hearing. Has tried OTC cold preps without success. No GI complaints. Appetite unchanged  No sick contacts, recent antibiotics, or recent travels.   No leg trauma, sedentary periods, h/o cancer, or tobacco use.  Past Medical History  Diagnosis Date  . Hypertension   . Mitral valve regurgitation   . Gastroesophageal reflux   . Abnormal pap   . Chest pain radiating to arm      Home Meds: Prior to Admission medications   Medication Sig Start Date End Date Taking? Authorizing Provider  aspirin EC 81 MG tablet Take 81 mg by mouth daily as needed. For pain.   Yes Historical Provider, MD  cetirizine (ZYRTEC) 10 MG tablet Take 10 mg by mouth daily.   Yes Historical Provider, MD  lansoprazole (PREVACID) 30 MG capsule Take 30 mg by mouth daily.   Yes Historical Provider, MD  Multiple Vitamin (MULITIVITAMIN WITH MINERALS) TABS Take 1 tablet by mouth daily.   Yes Historical Provider, MD  Norethindrone Acetate-Ethinyl Estradiol (LOESTRIN 1.5/30, 21,) 1.5-30 MG-MCG tablet Take 1 tablet by mouth daily.   Yes Historical Provider, MD  pantoprazole (PROTONIX) 20 MG tablet Take 2 tablets (40 mg total) by mouth 2 (two) times daily. 06/20/12  Yes Trixie Dredge, PA-C  albuterol (PROVENTIL HFA;VENTOLIN HFA) 108 (90 BASE) MCG/ACT inhaler Inhale 2 puffs into the lungs every 6 (six) hours as needed for wheezing. 12/04/12   Elvina Sidle, MD  azithromycin (ZITHROMAX Z-PAK) 250 MG tablet Take as directed on pack 12/04/12   Elvina Sidle, MD  HYDROcodone-homatropine [facility]) 5-1.5 MG/5ML syrup Take 5 mLs by mouth every 8 (eight) hours as needed for cough. 12/04/12   Elvina Sidle, MD  Norethindrone-Ethinyl Estradiol-Fe Biphas (LO LOESTRIN FE) 1 MG-10 MCG / 10 MCG tablet Take 1 tablet by mouth daily. 08/12/12   Bennye Alm, MD    Allergies: No Known Allergies  History   Social History  . Marital Status: Single    Spouse Name: N/A    Number of Children: N/A  . Years of Education: N/A   Occupational History  . Not on file.   Social History Main Topics  . Smoking status: Never Smoker   . Smokeless tobacco: Not on file  . Alcohol Use: No  . Drug Use: No  . Sexual Activity: Yes    Birth Control/ Protection: Pill     Comment: Loestrin   Other Topics Concern  . Not on file   Social History Narrative  . No narrative on file     Review of Systems: Constitutional: negative for chills, fever, night sweats or weight changes Cardiovascular: negative for chest pain or palpitations Respiratory: negative for hemoptysis, wheezing, or shortness of breath Abdominal: negative for abdominal pain, nausea, vomiting or diarrhea Dermatological: negative for rash Neurologic: negative for headache   Physical Exam: Blood pressure 112/72, pulse 74, temperature 99.4 F (37.4 C), temperature source Oral, resp.  rate 18, height 5' 3.5" (1.613 m), weight 119 lb 6.4 oz (54.159 kg), last menstrual period 11/24/2012, SpO2 99.00%., Body mass index is 20.82 kg/(m^2). General: Well developed, well nourished, in no acute distress. Head: Normocephalic, atraumatic, eyes without discharge, sclera non-icteric, nares are congested. Bilateral auditory canals clear, TM's are without perforation, pearly grey with reflective cone of light bilaterally. No sinus TTP. Oral cavity moist, dentition normal. Posterior pharynx with post nasal  drip and mild erythema. No peritonsillar abscess or tonsillar exudate. Neck: Supple. No thyromegaly. Full ROM. No lymphadenopathy. Lungs: Coarse breath sounds bilaterally without wheezes, rales, or rhonchi. Breathing is unlabored.  Heart: RRR with S1 S2. No murmurs, rubs, or gallops appreciated. Msk:  Strength and tone normal for age. Extremities: No clubbing or cyanosis. No edema. Neuro: Alert and oriented X 3. Moves all extremities spontaneously. CNII-XII grossly in tact. Psych:  Responds to questions appropriately with a normal affect.    ASSESSMENT AND PLAN:  42 y.o. year old female with bronchitis. Acute bronchitis - Plan: azithromycin (ZITHROMAX Z-PAK) 250 MG tablet, HYDROcodone-homatropine (HYCODAN) 5-1.5 MG/5ML syrup, albuterol (PROVENTIL HFA;VENTOLIN HFA) 108 (90 BASE) MCG/ACT inhaler   -Mucinex -Tylenol/Motrin prn -Rest/fluids -RTC precautions -RTC 3-5 days if no improvement  Signed, Elvina Sidle, MD 12/04/2012 8:37 AM

## 2012-12-04 NOTE — Patient Instructions (Signed)

## 2012-12-07 ENCOUNTER — Telehealth: Payer: Self-pay

## 2012-12-07 ENCOUNTER — Other Ambulatory Visit: Payer: Self-pay | Admitting: Family Medicine

## 2012-12-07 MED ORDER — PREDNISONE 20 MG PO TABS: ORAL_TABLET | ORAL | Status: DC

## 2012-12-07 NOTE — Telephone Encounter (Signed)
Patient states her cough is getting worse. States that she would like prednisone called in to her [facility] [address]. [phone].

## 2012-12-28 ENCOUNTER — Ambulatory Visit: Payer: BC Managed Care – PPO | Admitting: Gynecology

## 2012-12-29 ENCOUNTER — Encounter: Payer: Self-pay | Admitting: Gynecology

## 2012-12-29 ENCOUNTER — Ambulatory Visit (INDEPENDENT_AMBULATORY_CARE_PROVIDER_SITE_OTHER): Payer: Managed Care, Other (non HMO) | Admitting: Gynecology

## 2012-12-29 VITALS — BP 116/74 | HR 80 | Resp 18 | Ht 63.5 in | Wt 120.0 lb

## 2012-12-29 DIAGNOSIS — D259 Leiomyoma of uterus, unspecified: Secondary | ICD-10-CM

## 2012-12-29 DIAGNOSIS — N898 Other specified noninflammatory disorders of vagina: Secondary | ICD-10-CM

## 2012-12-29 DIAGNOSIS — N920 Excessive and frequent menstruation with regular cycle: Secondary | ICD-10-CM

## 2012-12-29 MED ORDER — NORETHIN-ETH ESTRAD-FE BIPHAS 1 MG-10 MCG / 10 MCG PO TABS: 1.0000 | ORAL_TABLET | Freq: Every day | ORAL | Status: DC

## 2012-12-29 MED ORDER — HYDROCORTISONE ACETATE 25 MG RE SUPP: RECTAL | Status: DC

## 2012-12-29 NOTE — Progress Notes (Signed)
Pt here for BP check on LoLoestrin.  Pt reports taking without placebo, no break thru bleeding.  Pt is not currently sexually active.  Pt recently took a Z-pack and now thinks she has a yeast infection.  Itching burning and discharge.  Review of Systems  Constitutional: Negative for fever and chills.  Musculoskeletal: Negative for myalgias.  Neurological: Negative for weakness.   BP 116/74  Pulse 80  Resp 18  Ht 5' 3.5" (1.613 m)  Wt 120 lb (54.432 kg)  BMI 20.92 kg/m2  LMP 11/24/2012 General appearance: alert, cooperative and appears stated age Pelvic: Pelvic exam:  VULVA: normal appearing vulva with no masses, tenderness or lesions,  VAGINA: normal appearing vagina with normal color and discharge, no lesions, vaginal discharge - grey and thin, CERVIX: friable no gross lesions,  UTERUS: enlarged to 10 week's size, irregular,  ADNEXA: no masses. Neurologic: Grossly normal, alert and oreinted x3  WET MOUNT done - results: negative for pathogens, normal epithelial cells, white blood cells,    WP: many wbc, no yeast, trich or BV  A/P: BP check and medication follow up- doing well on current dose of ocp, will refill DIV with cervical inflammation- will treat with hc intravaginally, may cause a yeast infection, instructed to call with symptoms Discussed fibroids again- pt unsure regarding hysterectomy reviewed the benefits of hysterectomy over myomectomy-pt is s/p multiple myomectomy in past and now by  Most recent u/s has over 25 fibroids, most are small currently doing ok on ocp-will consider options, recommend pt see youtube video

## 2013-01-01 ENCOUNTER — Telehealth: Payer: Self-pay | Admitting: Gynecology

## 2013-01-01 ENCOUNTER — Ambulatory Visit (INDEPENDENT_AMBULATORY_CARE_PROVIDER_SITE_OTHER): Payer: Managed Care, Other (non HMO) | Admitting: Emergency Medicine

## 2013-01-01 VITALS — BP 132/88 | HR 84 | Temp 99.1°F | Resp 14 | Ht 64.5 in | Wt 123.0 lb

## 2013-01-01 DIAGNOSIS — M2550 Pain in unspecified joint: Secondary | ICD-10-CM

## 2013-01-01 LAB — POCT CBC
HCT, POC: 37.8 % (ref 37.7–47.9)
Hemoglobin: 11.9 g/dL — AB (ref 12.2–16.2)
MCH, POC: 29.5 pg (ref 27–31.2)
MCV: 93.5 fL (ref 80–97)
MPV: 10.5 fL (ref 0–99.8)
RBC: 4.04 M/uL (ref 4.04–5.48)
WBC: 5.6 10*3/uL (ref 4.6–10.2)

## 2013-01-01 NOTE — Telephone Encounter (Signed)
Spoke with patient. She c/o bilateral shoulders/joints pain.  States she spoke with you at office visit regarding this. Started last Tuesday.  States she can barely lift arms. Denies trauma. Took Advil, 800 mg Saturday. Took Mobic on Sunday. States it did not help. Requesting additional follow up and time off of work. Please advise.

## 2013-01-01 NOTE — Telephone Encounter (Signed)
Spoke with patient and advised of message from Dr. Farrel Gobble. She will seek care at PCP.

## 2013-01-01 NOTE — Patient Instructions (Signed)

## 2013-01-01 NOTE — Progress Notes (Addendum)
[facility] [address], [place] [address] [phone]  Date:  01/01/2013   Name:  Sharon Mendoza   DOB:  [birth year]   MRN:  [id number]  PCP:  Yves Dill, MD    Chief Complaint: Shoulder Pain   History of Present Illness:  Sharon Mendoza is a 42 y.o. very pleasant female patient who presents with the following:  Sudden onset of bilateral shoulder joint pain.  Has a strong family history of RA and osteoarthritis.  She has no history of injury or overuse.  No rash, ticks, antecedent illness, change in medication.  No fever or chills.  No joint erythema or swelling pain worse with activity and when laying down to sleep at night.  Marked difficulty brushing her hair or washing her face due to pain.  Has marked change in her abilities over the past week.  No improvement with over the counter medications or other home remedies. Denies other complaint or health concern today.   Patient Active Problem List   Diagnosis Date Noted  . Fibroid uterus 06/22/2012  . Menorrhagia 06/22/2012  . Dysmenorrhea 06/22/2012    Past Medical History  Diagnosis Date  . Hypertension   . Mitral valve regurgitation   . Gastroesophageal reflux   . Abnormal pap   . Chest pain radiating to arm     Past Surgical History  Procedure Laterality Date  . Cesarean section  09/2005    History  Substance Use Topics  . Smoking status: Never Smoker   . Smokeless tobacco: Not on file  . Alcohol Use: No    Family History  Problem Relation Age of Onset  . Hypertension Mother   . Liver disease Mother   . Cancer Father     colon  . Heart disease Brother   . Aneurysm Brother   . Heart attack Brother   . Breast cancer Brother   . Asthma Son   . Aneurysm Sister   . Aneurysm Brother   . Aneurysm Sister   . Heart attack Sister   . Aneurysm Sister     No Known Allergies  Medication list has been reviewed and updated.  Current Outpatient Prescriptions on File Prior to  Visit  Medication Sig Dispense Refill  . albuterol (PROVENTIL HFA;VENTOLIN HFA) 108 (90 BASE) MCG/ACT inhaler Inhale 2 puffs into the lungs every 6 (six) hours as needed for wheezing.  1 Inhaler  0  . aspirin EC 81 MG tablet Take 81 mg by mouth daily as needed. For pain.      . cetirizine (ZYRTEC) 10 MG tablet Take 10 mg by mouth daily.      . hydrocortisone (ANUSOL-HC) 25 MG suppository 1 intravaginally for 5 nights  12 suppository  0  . Multiple Vitamin (MULITIVITAMIN WITH MINERALS) TABS Take 1 tablet by mouth daily.      . Norethindrone-Ethinyl Estradiol-Fe Biphas (LO LOESTRIN FE) 1 MG-10 MCG / 10 MCG tablet Take 1 tablet by mouth daily.  3 Package  3  . pantoprazole (PROTONIX) 20 MG tablet Take 2 tablets (40 mg total) by mouth 2 (two) times daily.  30 tablet  0   No current facility-administered medications on file prior to visit.    Review of Systems:  As per HPI, otherwise negative.    Physical Examination: Filed Vitals:   01/01/13 1651  BP: 132/88  Pulse: 84  Temp: 99.1 F (37.3 C)  Resp: 14   Filed Vitals:   01/01/13 1651  Height:  5' 4.5" (1.638 m)  Weight: 123 lb (55.792 kg)   Body mass index is 20.79 kg/(m^2). Ideal Body Weight: Weight in (lb) to have BMI = 25: 147.6   GEN: WDWN, NAD, Non-toxic, Alert & Oriented x 3 HEENT: Atraumatic, Normocephalic.  Ears and Nose: No external deformity. EXTR: No clubbing/cyanosis/edema NEURO: Normal gait.  PSYCH: Normally interactive. Conversant. Not depressed or anxious appearing.  Calm demeanor.  Shoulders:  Tender and guard bilaterally with pain with elevation of arms or flexion or extension of elbows against resistance.   Hands:  No joint swelling, erythema or tenderness.    Assessment and Plan: Poly arthralgias Labs Continue mobic Plan rheumatology consult after labs  Signed,  Phillips Odor, MD   Results for orders placed in visit on 01/01/13  POCT CBC      Result Value Range   WBC 5.6  4.6 - 10.2 K/uL    Lymph, poc 2.6  0.6 - 3.4   POC LYMPH PERCENT 46.9  10 - 50 %L   MID (cbc) 0.6  0 - 0.9   POC MID % 10.3  0 - 12 %M   POC Granulocyte 2.4  2 - 6.9   Granulocyte percent 42.8  37 - 80 %G   RBC 4.04  4.04 - 5.48 M/uL   Hemoglobin 11.9 (*) 12.2 - 16.2 g/dL   HCT, POC 16.1  09.6 - 47.9 %   MCV 93.5  80 - 97 fL   MCH, POC 29.5  27 - 31.2 pg   MCHC 31.5 (*) 31.8 - 35.4 g/dL   RDW, POC 04.5     Platelet Count, POC 223  142 - 424 K/uL   MPV 10.5  0 - 99.8 fL

## 2013-01-01 NOTE — Telephone Encounter (Signed)
Patient calling with continuing arm pain and wants Dr. Farrel Gobble to schedule some "xray's for her?" Patient also requesting to be written out of work for a few days?

## 2013-01-01 NOTE — Telephone Encounter (Signed)
It is not gynecologic, can she be seen at a PCP or urgent care to really evaluate and treat properly, we can recommend someone if she needs

## 2013-01-02 LAB — RHEUMATOID FACTOR: Rhuematoid fact SerPl-aCnc: <10 IU/mL (ref <=14)

## 2013-01-03 ENCOUNTER — Telehealth: Payer: Self-pay

## 2013-01-03 LAB — CYCLIC CITRUL PEPTIDE ANTIBODY, IGG: Cyclic Citrullin Peptide Ab: <2 U/mL (ref 0.0–5.0)

## 2013-01-03 NOTE — Telephone Encounter (Signed)
Pt calling to check the status of lab results from her office visit on 01/01/13.  Please call as soon as results are available.

## 2013-01-04 NOTE — Addendum Note (Signed)
Addended by: Carmelina Dane on: 01/04/2013 08:54 AM   Modules accepted: Orders

## 2013-01-12 ENCOUNTER — Encounter: Payer: Self-pay | Admitting: Gynecology

## 2013-01-14 ENCOUNTER — Other Ambulatory Visit: Payer: Self-pay | Admitting: Gynecology

## 2013-01-14 DIAGNOSIS — N898 Other specified noninflammatory disorders of vagina: Secondary | ICD-10-CM

## 2013-01-14 MED ORDER — FLUCONAZOLE 150 MG PO TABS: 150.0000 mg | ORAL_TABLET | Freq: Once | ORAL | Status: DC

## 2013-01-25 ENCOUNTER — Other Ambulatory Visit: Payer: Self-pay

## 2013-03-23 ENCOUNTER — Telehealth: Payer: Self-pay | Admitting: *Deleted

## 2013-03-23 NOTE — Telephone Encounter (Addendum)
Fax From: [facility] for Pantoprazole 40 mg Last Refilled: 07/12/12 #30/0 refills by Laverda Sorenson AEX: 06/22/12   Looked in paper chart, don't see where we have prescribed this to patient  Left Message To Call Back to confirm this

## 2013-03-23 NOTE — Telephone Encounter (Signed)
S/w patient told her that Dr.Lathrop has never prescribed this for her, she said she knows but that she gave Dr.Lathrop a whole list of her medications since she is the only doctor she sees. She is wanting to know if Dr.Lathrop can fill it. I told patient that Dr.Lathrop is out of the office and won't be back until Monday. Patient says she has 2 pills left and is okay to wait until Monday.  Please advise

## 2013-03-26 ENCOUNTER — Other Ambulatory Visit: Payer: Self-pay | Admitting: Gynecology

## 2013-03-26 MED ORDER — PANTOPRAZOLE SODIUM 40 MG PO TBEC: 40.0000 mg | DELAYED_RELEASE_TABLET | Freq: Every day | ORAL | Status: DC

## 2013-03-26 NOTE — Telephone Encounter (Signed)
Patient notified. Patient asked for me to relay a message to Dr.Lathrop   Patient says she's been having left leg cramps (behind the knee) since the 27th of December. She has not been to the hospital to see if anything is wrong. She said it's starting to worry her.  Please advise

## 2013-03-26 NOTE — Telephone Encounter (Signed)
Medication refilled, pls inform pt

## 2013-03-26 NOTE — Telephone Encounter (Signed)
Left Message To Call Back  

## 2013-03-26 NOTE — Telephone Encounter (Signed)
See phone note

## 2013-03-26 NOTE — Telephone Encounter (Signed)
Advise pt to go to urgent care for evaluation

## 2013-03-26 NOTE — Telephone Encounter (Signed)
Patient notified

## 2013-04-09 ENCOUNTER — Ambulatory Visit (HOSPITAL_COMMUNITY)
Admission: RE | Admit: 2013-04-09 | Discharge: 2013-04-09 | Disposition: A | Discharge status: Home / Self Care | Payer: Managed Care, Other (non HMO) | Source: Ambulatory Visit | Attending: Internal Medicine | Admitting: Internal Medicine

## 2013-04-09 ENCOUNTER — Other Ambulatory Visit (HOSPITAL_COMMUNITY): Payer: Self-pay | Admitting: Internal Medicine

## 2013-04-09 DIAGNOSIS — M79661 Pain in right lower leg: Secondary | ICD-10-CM

## 2013-04-09 DIAGNOSIS — M79609 Pain in unspecified limb: Secondary | ICD-10-CM

## 2013-04-09 NOTE — Progress Notes (Signed)
VASCULAR LAB PRELIMINARY  PRELIMINARY  PRELIMINARY  PRELIMINARY  Right lower extremity venous duplex completed.    Preliminary report:  Right leg is negative for deep and superficial vein thrombosis.  Report called to [place] in office.  Winefred Hillesheim, RVT 04/09/2013, 3:21 PM

## 2013-07-20 ENCOUNTER — Ambulatory Visit (INDEPENDENT_AMBULATORY_CARE_PROVIDER_SITE_OTHER): Payer: Managed Care, Other (non HMO) | Admitting: Emergency Medicine

## 2013-07-20 ENCOUNTER — Telehealth: Payer: Self-pay | Admitting: Gynecology

## 2013-07-20 VITALS — BP 118/72 | HR 86 | Temp 98.6°F | Resp 16 | Ht 64.5 in | Wt 126.2 lb

## 2013-07-20 DIAGNOSIS — Z299 Encounter for prophylactic measures, unspecified: Secondary | ICD-10-CM

## 2013-07-20 MED ORDER — OSELTAMIVIR PHOSPHATE 75 MG PO CAPS: 75.0000 mg | ORAL_CAPSULE | Freq: Every day | ORAL | Status: DC

## 2013-07-20 NOTE — Progress Notes (Signed)
   Subjective:    Patient ID: Sharon Mendoza, female    DOB: 1970/11/28, 43 y.o.   MRN: [id number]  HPI 43 year old female pt presents for a prescription of Tamiflu. Her son was diagnosed with flu A today. She thought he was having a flare up of his asthma. She is not feeling ill today.    Review of Systems     Objective:   Physical Exam patient is not ill. Neck is supple. Chest was clear to auscultation and percussion.        Assessment & Plan:  The son went to the pediatrician's office today. He tested positive for influenza A. She is requesting prophylaxis with Tamiflu. This is certainly reasonable.

## 2013-07-20 NOTE — Telephone Encounter (Signed)
Pt states her son tested positive for the flu and she was told to call her doctor and ask for Tamiflu.

## 2013-07-20 NOTE — Telephone Encounter (Signed)
Return call to patient.  Support given [facility] child and that we have heard flu activity has increased this week.  Unfortunately this is not something we would prescribe.  Needs PCP or urgent care. Patient states she is at urgent care. Expressed frustration that Dr lathrop has gone from being her "everything doctor to her nothing doctor" except GYN.  Advised that we are gynecologist and she states she realizes this but was used to having Dr Charlies Constable do all this in the past. Call disconnected.  Routing to provider for final review. Patient agreeable to disposition. Will close encounter

## 2013-07-20 NOTE — Patient Instructions (Signed)

## 2013-08-18 ENCOUNTER — Encounter (HOSPITAL_COMMUNITY): Payer: Self-pay | Admitting: Emergency Medicine

## 2013-08-18 ENCOUNTER — Emergency Department (HOSPITAL_COMMUNITY)
Admission: EM | Admit: 2013-08-18 | Discharge: 2013-08-18 | Disposition: A | Discharge status: Home / Self Care | Payer: Managed Care, Other (non HMO) | Attending: Emergency Medicine | Admitting: Emergency Medicine

## 2013-08-18 DIAGNOSIS — Z7982 Long term (current) use of aspirin: Secondary | ICD-10-CM | POA: Insufficient documentation

## 2013-08-18 DIAGNOSIS — Z79899 Other long term (current) drug therapy: Secondary | ICD-10-CM | POA: Insufficient documentation

## 2013-08-18 DIAGNOSIS — K219 Gastro-esophageal reflux disease without esophagitis: Secondary | ICD-10-CM | POA: Insufficient documentation

## 2013-08-18 DIAGNOSIS — I10 Essential (primary) hypertension: Secondary | ICD-10-CM

## 2013-08-18 MED ORDER — ENALAPRIL MALEATE 5 MG PO TABS: 5.0000 mg | ORAL_TABLET | Freq: Every day | ORAL | Status: DC

## 2013-08-18 NOTE — ED Notes (Signed)
Pt states that she was on htn meds until 2013 but was drinking protein shakes that "dropped her blood pressure" so her doctor took her off of her meds.  States that she has been checking it and it has been "high all week" and became concerned when her BP was 147/92 this morning.  Called her doctor and they wanted her to come in to have it checked next week but she didn't want to wait so she came to the ER.  Pt is having no sx of HTN.

## 2013-08-18 NOTE — ED Provider Notes (Signed)
CSN: [id number]     Arrival date & time 08/18/13  0937 History   First MD Initiated Contact with Patient 08/18/13 1022     Chief Complaint  Patient presents with  . Hypertension      HPI Patient used to be on enalapril for blood pressure.  She's been off this since 2013.  She's been managing her blood pressure with diet and exercise.  Over the past week she's been checking her blood pressure at home and has been consistently in the 160s.  She's concerned about this.  She would feel better she is back on her blood pressure medicine.  She called her primary care physician who scheduled an appointment for next week but would not start her medication without seeing her first.  Patient denies chest pain shortness of breath.  No headache.  No new weakness of her arms or legs.  No other complaints at this time.  She has a strong family history of hypertension.   Past Medical History  Diagnosis Date  . Hypertension   . Mitral valve regurgitation   . Gastroesophageal reflux   . Abnormal pap   . Chest pain radiating to arm    Past Surgical History  Procedure Laterality Date  . Cesarean section  09/2005   Family History  Problem Relation Age of Onset  . Hypertension Mother   . Liver disease Mother   . Cancer Father     colon  . Heart disease Brother   . Aneurysm Brother   . Heart attack Brother   . Breast cancer Brother   . Asthma Son   . Aneurysm Sister   . Aneurysm Brother   . Aneurysm Sister   . Heart attack Sister   . Aneurysm Sister    History  Substance Use Topics  . Smoking status: Never Smoker   . Smokeless tobacco: Not on file  . Alcohol Use: No   OB History   Grav Para Term Preterm Abortions TAB SAB Ect Mult Living   1 1        1      Review of Systems  All other systems reviewed and are negative.     Allergies  Review of patient's allergies indicates no known allergies.  Home Medications   Prior to Admission medications   Medication Sig Start Date End  Date Taking? Authorizing Provider  aspirin EC 81 MG tablet Take 81 mg by mouth daily as needed for mild pain.    Yes Historical Provider, MD  cetirizine (ZYRTEC) 10 MG tablet Take 10 mg by mouth daily.   Yes Historical Provider, MD  Norethindrone-Ethinyl Estradiol-Fe Biphas (LO LOESTRIN FE) 1 MG-10 MCG / 10 MCG tablet Take 1 tablet by mouth daily. 12/29/12  Yes Azalia Bilis, MD  pantoprazole (PROTONIX) 40 MG tablet Take 1 tablet (40 mg total) by mouth daily. 03/26/13  Yes Azalia Bilis, MD  enalapril (VASOTEC) 5 MG tablet Take 1 tablet (5 mg total) by mouth daily. 08/18/13   Hoy Morn, MD   BP 161/92  Pulse 116  Temp(Src) 98 F (36.7 C) (Oral)  Resp 16  SpO2 100%  LMP 07/21/2013 Physical Exam  Nursing note and vitals reviewed. Constitutional: She is oriented to person, place, and time. She appears well-developed and well-nourished.  HENT:  Head: Normocephalic.  Eyes: EOM are normal.  Neck: Normal range of motion.  Pulmonary/Chest: Effort normal.  Abdominal: She exhibits no distension.  Musculoskeletal: Normal range of motion.  Neurological: She  is alert and oriented to person, place, and time.  Psychiatric: She has a normal mood and affect.    ED Course  Procedures (including critical care time) Labs Review Labs Reviewed - No data to display  Imaging Review No results found.   EKG Interpretation None      MDM   Final diagnoses:  HTN (hypertension)    Asymptomatic hypertension.  Patient will feel better she is back on her enalapril.  I don't think this is unreasonable.  She'll be started back on this and she can followup with her primary care physician.  Overall well-appearing.    Hoy Morn, MD 08/18/13 [phone]

## 2013-08-18 NOTE — Discharge Instructions (Signed)
Arterial Hypertension °Arterial hypertension (high blood pressure) is a condition of elevated pressure in your blood vessels. Hypertension over a long period of time is a risk factor for strokes, heart attacks, and heart failure. It is also the leading cause of kidney (renal) failure.  °CAUSES  °· In Adults -- Over 90% of all hypertension has no known cause. This is called essential or primary hypertension. In the other 10% of people with hypertension, the increase in blood pressure is caused by another disorder. This is called secondary hypertension. Important causes of secondary hypertension are: °· Heavy alcohol use. °· Obstructive sleep apnea. °· Hyperaldosterosim (Conn's syndrome). °· Steroid use. °· Chronic kidney failure. °· Hyperparathyroidism. °· Medications. °· Renal artery stenosis. °· Pheochromocytoma. °· Cushing's disease. °· Coarctation of the aorta. °· Scleroderma renal crisis. °· Licorice (in excessive amounts). °· Drugs (cocaine, methamphetamine). °Your caregiver can explain any items above that apply to you. °· In Children -- Secondary hypertension is more common and should always be considered. °· Pregnancy -- Few women of childbearing age have high blood pressure. However, up to 10% of them develop hypertension of pregnancy. Generally, this will not harm the woman. It may be a sign of 3 complications of pregnancy: preeclampsia, HELLP syndrome, and eclampsia. Follow up and control with medication is necessary. °SYMPTOMS  °· This condition normally does not produce any noticeable symptoms. It is usually found during a routine exam. °· Malignant hypertension is a late problem of high blood pressure. It may have the following symptoms: °· Headaches. °· Blurred vision. °· End-organ damage (this means your kidneys, heart, lungs, and other organs are being damaged). °· Stressful situations can increase the blood pressure. If a person with normal blood pressure has their blood pressure go up while being  seen by their caregiver, this is often termed "white coat hypertension." Its importance is not known. It may be related with eventually developing hypertension or complications of hypertension. °· Hypertension is often confused with mental tension, stress, and anxiety. °DIAGNOSIS  °The diagnosis is made by 3 separate blood pressure measurements. They are taken at least 1 week apart from each other. If there is organ damage from hypertension, the diagnosis may be made without repeat measurements. °Hypertension is usually identified by having blood pressure readings: °· Above 140/90 mmHg measured in both arms, at 3 separate times, over a couple weeks. °· Over 130/80 mmHg should be considered a risk factor and may require treatment in patients with diabetes. °Blood pressure readings over 120/80 mmHg are called "pre-hypertension" even in non-diabetic patients. °To get a true blood pressure measurement, use the following guidelines. Be aware of the factors that can alter blood pressure readings. °· Take measurements at least 1 hour after caffeine. °· Take measurements 30 minutes after smoking and without any stress. This is another reason to quit smoking  it raises your blood pressure. °· Use a proper cuff size. Ask your caregiver if you are not sure about your cuff size. °· Most home blood pressure cuffs are automatic. They will measure systolic and diastolic pressures. The systolic pressure is the pressure reading at the start of sounds. Diastolic pressure is the pressure at which the sounds disappear. If you are elderly, measure pressures in multiple postures. Try sitting, lying or standing. °· Sit at rest for a minimum of 5 minutes before taking measurements. °· You should not be on any medications like decongestants. These are found in many cold medications. °· Record your blood pressure readings and review   them with your caregiver. °If you have hypertension: °· Your caregiver may do tests to be sure you do not have  secondary hypertension (see "causes" above). °· Your caregiver may also look for signs of metabolic syndrome. This is also called Syndrome X or Insulin Resistance Syndrome. You may have this syndrome if you have type 2 diabetes, abdominal obesity, and abnormal blood lipids in addition to hypertension. °· Your caregiver will take your medical and family history and perform a physical exam. °· Diagnostic tests may include blood tests (for glucose, cholesterol, potassium, and kidney function), a urinalysis, or an EKG. Other tests may also be necessary depending on your condition. °PREVENTION  °There are important lifestyle issues that you can adopt to reduce your chance of developing hypertension: °· Maintain a normal weight. °· Limit the amount of salt (sodium) in your diet. °· Exercise often. °· Limit alcohol intake. °· Get enough potassium in your diet. Discuss specific advice with your caregiver. °· Follow a DASH diet (dietary approaches to stop hypertension). This diet is rich in fruits, vegetables, and low-fat dairy products, and avoids certain fats. °PROGNOSIS  °Essential hypertension cannot be cured. Lifestyle changes and medical treatment can lower blood pressure and reduce complications. The prognosis of secondary hypertension depends on the underlying cause. Many people whose hypertension is controlled with medicine or lifestyle changes can live a normal, healthy life.  °RISKS AND COMPLICATIONS  °While high blood pressure alone is not an illness, it often requires treatment due to its short- and long-term effects on many organs. Hypertension increases your risk for: °· CVAs or strokes (cerebrovascular accident). °· Heart failure due to chronically high blood pressure (hypertensive cardiomyopathy). °· Heart attack (myocardial infarction). °· Damage to the retina (hypertensive retinopathy). °· Kidney failure (hypertensive nephropathy). °Your caregiver can explain list items above that apply to you. Treatment  of hypertension can significantly reduce the risk of complications. °TREATMENT  °· For overweight patients, weight loss and regular exercise are recommended. Physical fitness lowers blood pressure. °· Mild hypertension is usually treated with diet and exercise. A diet rich in fruits and vegetables, fat-free dairy products, and foods low in fat and salt (sodium) can help lower blood pressure. Decreasing salt intake decreases blood pressure in a 1/3 of people. °· Stop smoking if you are a smoker. °The steps above are highly effective in reducing blood pressure. While these actions are easy to suggest, they are difficult to achieve. Most patients with moderate or severe hypertension end up requiring medications to bring their blood pressure down to a normal level. There are several classes of medications for treatment. Blood pressure pills (antihypertensives) will lower blood pressure by their different actions. Lowering the blood pressure by 10 mmHg may decrease the risk of complications by as much as 25%. °The goal of treatment is effective blood pressure control. This will reduce your risk for complications. Your caregiver will help you determine the best treatment for you according to your lifestyle. What is excellent treatment for one person, may not be for you. °HOME CARE INSTRUCTIONS  °· Do not smoke. °· Follow the lifestyle changes outlined in the "Prevention" section. °· If you are on medications, follow the directions carefully. Blood pressure medications must be taken as prescribed. Skipping doses reduces their benefit. It also puts you at risk for problems. °· Follow up with your caregiver, as directed. °· If you are asked to monitor your blood pressure at home, follow the guidelines in the "Diagnosis" section above. °SEEK MEDICAL CARE   IF:  °· You think you are having medication side effects. °· You have recurrent headaches or lightheadedness. °· You have swelling in your ankles. °· You have trouble with  your vision. °SEEK IMMEDIATE MEDICAL CARE IF:  °· You have sudden onset of chest pain or pressure, difficulty breathing, or other symptoms of a heart attack. °· You have a severe headache. °· You have symptoms of a stroke (such as sudden weakness, difficulty speaking, difficulty walking). °MAKE SURE YOU:  °· Understand these instructions. °· Will watch your condition. °· Will get help right away if you are not doing well or get worse. °Document Released: 03/08/2005 Document Revised: 05/31/2011 Document Reviewed: 10/06/2006 °Exit[employer]® Patient Information ©2014 [employer]. ° °

## 2013-09-05 ENCOUNTER — Other Ambulatory Visit: Payer: Self-pay | Admitting: Gynecology

## 2013-10-12 ENCOUNTER — Other Ambulatory Visit: Payer: Self-pay

## 2013-10-12 NOTE — Telephone Encounter (Signed)
Last AEX: 06/22/12 Last refill:08/18/13 #30, 0 Current AEX: not scheduled  Please advise

## 2013-10-22 NOTE — Telephone Encounter (Signed)
Pt has seen Dr  Everlene Farrier per EPic, I think it would be more reasonable for IM to refill this medication now that she seem to be established.  pls call and notify pt, if she cannot get it refilled immediately and is out we can give her some but will need to document a BP.

## 2013-10-22 NOTE — Telephone Encounter (Signed)
LMOM to contact office 

## 2013-10-23 ENCOUNTER — Ambulatory Visit (INDEPENDENT_AMBULATORY_CARE_PROVIDER_SITE_OTHER): Payer: Managed Care, Other (non HMO) | Admitting: Gynecology

## 2013-10-23 VITALS — BP 150/90 | HR 72 | Ht 64.5 in | Wt 131.0 lb

## 2013-10-23 DIAGNOSIS — I10 Essential (primary) hypertension: Secondary | ICD-10-CM

## 2013-10-23 MED ORDER — ENALAPRIL MALEATE 5 MG PO TABS: 5.0000 mg | ORAL_TABLET | Freq: Every day | ORAL | Status: DC

## 2013-10-23 NOTE — Telephone Encounter (Signed)
Pt returning call

## 2013-10-23 NOTE — Telephone Encounter (Signed)
S/W pt. Pt does not have a PCP as of today. Pt will come in today for a blood pressure check to get her refill and states she will find a primary doctor.

## 2013-10-24 ENCOUNTER — Telehealth: Payer: Self-pay | Admitting: Gynecology

## 2013-10-24 NOTE — Telephone Encounter (Signed)
10/23/13 rx was printed and faxed to [facility]. This morning faxed failed. Rx was refaxed by [facility] to confirm that they received it and they have it's also ready for the patient to pick up LM on patient's VM that rx has been sent [phone] confirmed patient's name)

## 2013-10-24 NOTE — Telephone Encounter (Signed)
Patient calling stating she was seen yesterday for a blood pressure check and we were supposed to have sent an RX to the pharmacy for: enalapril (VASOTEC) 5 MG tablet  Take 1 tablet (5 mg total) by mouth daily., Starting 10/23/2013, Until Discontinued, Print, Last Dose: Not Recorded. She says the pharmacy does not have the RX.   [facility]  [facility]

## 2013-10-28 NOTE — Progress Notes (Signed)
Pt here for BP check before refilling vasotec.  Pt had seen Dr Everlene Farrier but was not using him to manage her elevated BP, as result she has run out of her medications. BP 150/90 Elevated BP Will refill her vasotec and have a BP check done on medication Stress that she needs to find PCP to fill She will return back for BP check. Pt agreeable

## 2013-11-11 ENCOUNTER — Ambulatory Visit (INDEPENDENT_AMBULATORY_CARE_PROVIDER_SITE_OTHER): Payer: Managed Care, Other (non HMO) | Admitting: Family Medicine

## 2013-11-11 VITALS — BP 140/90 | HR 92 | Temp 98.4°F | Resp 16 | Ht 63.5 in | Wt 129.0 lb

## 2013-11-11 DIAGNOSIS — R059 Cough, unspecified: Secondary | ICD-10-CM

## 2013-11-11 DIAGNOSIS — R6883 Chills (without fever): Secondary | ICD-10-CM

## 2013-11-11 DIAGNOSIS — R0989 Other specified symptoms and signs involving the circulatory and respiratory systems: Secondary | ICD-10-CM

## 2013-11-11 DIAGNOSIS — J3489 Other specified disorders of nose and nasal sinuses: Secondary | ICD-10-CM

## 2013-11-11 DIAGNOSIS — R5383 Other fatigue: Secondary | ICD-10-CM

## 2013-11-11 DIAGNOSIS — R5381 Other malaise: Secondary | ICD-10-CM

## 2013-11-11 DIAGNOSIS — R05 Cough: Secondary | ICD-10-CM

## 2013-11-11 LAB — POCT INFLUENZA A/B
INFLUENZA A, POC: NEGATIVE
Influenza B, POC: NEGATIVE

## 2013-11-11 MED ORDER — HYDROCODONE-HOMATROPINE 5-1.5 MG/5ML PO SYRP: 5.0000 mL | ORAL_SOLUTION | Freq: Three times a day (TID) | ORAL | Status: DC | PRN

## 2013-11-11 MED ORDER — DOXYCYCLINE HYCLATE 100 MG PO TABS: 100.0000 mg | ORAL_TABLET | Freq: Two times a day (BID) | ORAL | Status: DC

## 2013-11-11 NOTE — Progress Notes (Signed)
[facility] [address] [place] [address] [phone]  Date:  11/11/2013   Name:  Sharon Mendoza   DOB:  Jan 25, 1971   MRN:  [id number]  PCP:  Azalia Bilis, MD    Chief Complaint: Cough, Headache and Chest Pain   History of Present Illness:  Sharon Mendoza is a 43 y.o. very pleasant female patient who presents with the following:  She is here today with illness.  She noted "sneezing and sniffles" about 3-4 days ago.  She then developed a cough which can be productive.  She did have a "burning" throat when her sx started, but no longer.   She still does have a runny nose.   She was seen by her rheumatologist last week and started on prednisone.  She is taking a 12 day dose- pack.   No GI symptoms.   LMP 10/20/13.   Her son is 24 years old and has asthma. He has seemed to be ok so far.   She has never been a smoker.   She has noted chills, but has not checked her temp at home.   She is not taking any OTC anti-pyretis.    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 Fibroid uterus 06/22/2012  . Menorrhagia 06/22/2012  . Dysmenorrhea 06/22/2012    Past Medical History  Diagnosis Date  . Hypertension   . Mitral valve regurgitation   . Gastroesophageal reflux   . Abnormal pap   . Chest pain radiating to arm     Past Surgical History  Procedure Laterality Date  . Cesarean section  09/2005    History  Substance Use Topics  . Smoking status: Never Smoker   . Smokeless tobacco: Not on file  . Alcohol Use: No    Family History  Problem Relation Age of Onset  . Hypertension Mother   . Liver disease Mother   . Cancer Father     colon  . Heart disease Brother   . Aneurysm Brother   . Heart attack Brother   . Breast cancer Brother   . Asthma Son   . Aneurysm Sister   . Aneurysm Brother   . Aneurysm Sister   . Heart attack Sister   . Aneurysm Sister     No Known  Allergies  Medication list has been reviewed and updated.  Current Outpatient Prescriptions on File Prior to Visit  Medication Sig Dispense Refill  . aspirin EC 81 MG tablet Take 81 mg by mouth daily as needed for mild pain.       . cetirizine (ZYRTEC) 10 MG tablet Take 10 mg by mouth daily.      . enalapril (VASOTEC) 5 MG tablet Take 1 tablet (5 mg total) by mouth daily.  30 tablet  0  . Norethindrone-Ethinyl Estradiol-Fe Biphas (LO LOESTRIN FE) 1 MG-10 MCG / 10 MCG tablet Take 1 tablet by mouth daily.  3 Package  3  . pantoprazole (PROTONIX) 40 MG tablet Take 1 tablet (40 mg total) by mouth daily.  30 tablet  10   No current facility-administered medications on file prior to visit.    Review of Systems:  As per HPI- otherwise negative.   Physical Examination: Filed Vitals:   11/11/13 0918  BP: 140/90  Pulse: 92  Temp: 98.4 F (36.9 C)  Resp: 16   Filed Vitals:   11/11/13 0918  Height: 5' 3.5" (  1.613 m)  Weight: 129 lb (58.514 kg)   Body mass index is 22.49 kg/(m^2). Ideal Body Weight: Weight in (lb) to have BMI = 25: 143.1  GEN: WDWN, NAD, Non-toxic, A & O x 3, appears to have a cold. occasional cough, otherwise looks well HEENT: Atraumatic, Normocephalic. Neck supple. No masses, No LAD.  Bilateral TM wnl, oropharynx normal.  PEERL,EOMI.   Nasal cavity is inflamed Ears and Nose: No external deformity. CV: RRR, No M/G/R. No JVD. No thrill. No extra heart sounds. PULM: CTA B, no wheezes, crackles, rhonchi. No retractions. No resp. distress. No accessory muscle use. EXTR: No c/c/e NEURO Normal gait.  PSYCH: Normally interactive. Conversant. Not depressed or anxious appearing.  Calm demeanor.   Results for orders placed in visit on 11/11/13  POCT INFLUENZA A/B      Result Value Ref Range   Influenza A, POC Negative     Influenza B, POC Negative      Assessment and Plan: Cough - Plan: POCT Influenza A/B, HYDROcodone-homatropine (HYCODAN) 5-1.5 MG/5ML syrup,  doxycycline (VIBRA-TABS) 100 MG tablet  Runny nose - Plan: POCT Influenza A/B  Chills - Plan: POCT Influenza A/B  Other malaise and fatigue - Plan: POCT Influenza A/B  Likely viral URI.  She will use hycodan as needed ,and will hold and use the doxycycline if not better in the next couple of days- Sooner if worse.    See patient instructions for more details.     Signed Lamar Blinks, MD

## 2013-11-11 NOTE — Patient Instructions (Signed)
If you are not feeling better in the next couple of days start and use the doxycycline.  If you are getting worse, have a fever or any other concerns go ahead and start it and give me a call Use the cough syrup as needed but remember it will make you sleepy

## 2013-11-14 ENCOUNTER — Telehealth: Payer: Self-pay

## 2013-11-14 NOTE — Telephone Encounter (Signed)
Spoke to pt she states that she will RTC today to see Dr. Lorelei Pont. She wants to make sure this has not turned into a PN.

## 2013-11-14 NOTE — Telephone Encounter (Signed)
Patient was seen recently by Dr. Lorelei Pont. Was told to call and let us know if she needed to start taking the antibiotic. Well, she has started to take it. She has a really bad, persistent cough that is causing lingering pain in her chest and back. Does she need a CXR? What's the next course of action? Cb# [phone]

## 2013-11-16 ENCOUNTER — Telehealth: Payer: Self-pay | Admitting: Gynecology

## 2013-11-16 ENCOUNTER — Ambulatory Visit: Payer: Managed Care, Other (non HMO) | Admitting: Gynecology

## 2013-11-16 NOTE — Telephone Encounter (Signed)
No, thank you

## 2013-11-16 NOTE — Telephone Encounter (Signed)
Pt called and canceled her appt for this morning for aex. Pt said she had an upper respiratory infection and thought she would be better by today but she is not feeling any better. Pt rescheduled for 11/28/13 at 8:00 am. Do you want to charge pt for late cancellation?

## 2013-11-21 ENCOUNTER — Other Ambulatory Visit: Payer: Self-pay | Admitting: Internal Medicine

## 2013-11-21 DIAGNOSIS — M25511 Pain in right shoulder: Secondary | ICD-10-CM

## 2013-11-28 ENCOUNTER — Encounter: Payer: Self-pay | Admitting: Gynecology

## 2013-11-28 ENCOUNTER — Ambulatory Visit (INDEPENDENT_AMBULATORY_CARE_PROVIDER_SITE_OTHER): Payer: Managed Care, Other (non HMO) | Admitting: Gynecology

## 2013-11-28 VITALS — BP 110/70 | HR 66 | Resp 12 | Ht 63.0 in | Wt 130.0 lb

## 2013-11-28 DIAGNOSIS — K219 Gastro-esophageal reflux disease without esophagitis: Secondary | ICD-10-CM

## 2013-11-28 DIAGNOSIS — K649 Unspecified hemorrhoids: Secondary | ICD-10-CM

## 2013-11-28 DIAGNOSIS — D251 Intramural leiomyoma of uterus: Secondary | ICD-10-CM

## 2013-11-28 DIAGNOSIS — Z0000 Encounter for general adult medical examination without abnormal findings: Secondary | ICD-10-CM

## 2013-11-28 DIAGNOSIS — I10 Essential (primary) hypertension: Secondary | ICD-10-CM

## 2013-11-28 DIAGNOSIS — Z01419 Encounter for gynecological examination (general) (routine) without abnormal findings: Secondary | ICD-10-CM

## 2013-11-28 DIAGNOSIS — N920 Excessive and frequent menstruation with regular cycle: Secondary | ICD-10-CM

## 2013-11-28 LAB — LIPID PANEL
CHOL/HDL RATIO: 2.9 ratio
CHOLESTEROL: 161 mg/dL (ref 0–200)
HDL: 55 mg/dL (ref >39)
LDL Cholesterol: 88 mg/dL (ref 0–99)
TRIGLYCERIDES: 91 mg/dL (ref <150)
VLDL: 18 mg/dL (ref 0–40)

## 2013-11-28 LAB — COMPREHENSIVE METABOLIC PANEL
ALK PHOS: 45 U/L (ref 39–117)
ALT: <8 U/L (ref 0–35)
AST: 10 U/L (ref 0–37)
Albumin: 3.7 g/dL (ref 3.5–5.2)
BUN: 13 mg/dL (ref 6–23)
CO2: 26 meq/L (ref 19–32)
CREATININE: 0.73 mg/dL (ref 0.50–1.10)
Calcium: 9.2 mg/dL (ref 8.4–10.5)
Chloride: 105 mEq/L (ref 96–112)
GLUCOSE: 74 mg/dL (ref 70–99)
Potassium: 4.4 mEq/L (ref 3.5–5.3)
Sodium: 137 mEq/L (ref 135–145)
Total Bilirubin: 0.3 mg/dL (ref 0.2–1.2)
Total Protein: 6.2 g/dL (ref 6.0–8.3)

## 2013-11-28 LAB — VITAMIN B12: VITAMIN B 12: 391 pg/mL (ref 211–911)

## 2013-11-28 LAB — POCT URINALYSIS DIPSTICK
UROBILINOGEN UA: NEGATIVE
pH, UA: 5

## 2013-11-28 LAB — HEMOGLOBIN, FINGERSTICK: HEMOGLOBIN, FINGERSTICK: 12.7 g/dL (ref 12.0–16.0)

## 2013-11-28 MED ORDER — PANTOPRAZOLE SODIUM 40 MG PO TBEC: 40.0000 mg | DELAYED_RELEASE_TABLET | Freq: Every day | ORAL | Status: DC

## 2013-11-28 MED ORDER — NORETHIN-ETH ESTRAD-FE BIPHAS 1 MG-10 MCG / 10 MCG PO TABS: 1.0000 | ORAL_TABLET | Freq: Every day | ORAL | Status: DC

## 2013-11-28 MED ORDER — ENALAPRIL MALEATE 5 MG PO TABS: 5.0000 mg | ORAL_TABLET | Freq: Every day | ORAL | Status: DC

## 2013-11-28 MED ORDER — HYDROCORTISONE ACETATE 25 MG RE SUPP: 25.0000 mg | Freq: Two times a day (BID) | RECTAL | Status: DC

## 2013-11-28 NOTE — Progress Notes (Signed)
43 y.o. Single African American female   G1P1 here for annual exam. Pt is not currently sexually active. Cycles absent on lo-loestrin.     Patient's last menstrual period was 07/21/2013.          Sexually active: Yes.    The current method of family planning is OCP (estrogen/progesterone).    Exercising: No.  The patient does not participate in regular exercise at present. Last pap: 06/22/12 NEG HR HPV Alcohol: no Tobacco: no BSE:  Yes  Mammogram: 5+ years ago   Hgb: 12.7 ; Urine: Leuks 2     Health Maintenance  Topic Date Due  . Tetanus/tdap  01/17/1988  . Influenza Vaccine  10/20/2013  . Pap Smear  06/23/2015    Family History  Problem Relation Age of Onset  . Hypertension Mother   . Liver disease Mother   . Cancer Father     colon  . Heart disease Brother   . Aneurysm Brother   . Heart attack Brother   . Breast cancer Brother   . Asthma Son   . Aneurysm Sister   . Aneurysm Brother   . Aneurysm Sister   . Heart attack Sister   . Aneurysm Sister     Patient Active Problem List   Diagnosis Date Noted  . Fibroid uterus 06/22/2012  . Menorrhagia 06/22/2012  . Dysmenorrhea 06/22/2012    Past Medical History  Diagnosis Date  . Hypertension   . Mitral valve regurgitation   . Gastroesophageal reflux   . Abnormal pap   . Chest pain radiating to arm     Past Surgical History  Procedure Laterality Date  . Cesarean section  09/2005    Allergies: Review of patient's allergies indicates no known allergies.  Current Outpatient Prescriptions  Medication Sig Dispense Refill  . aspirin EC 81 MG tablet Take 81 mg by mouth daily as needed for mild pain.       . cetirizine (ZYRTEC) 10 MG tablet Take 10 mg by mouth daily.      . enalapril (VASOTEC) 5 MG tablet Take 1 tablet (5 mg total) by mouth daily.  30 tablet  0  . HYDROcodone-homatropine (HYCODAN) 5-1.5 MG/5ML syrup Take 5 mLs by mouth every 8 (eight) hours as needed for cough.  90 mL  0  . Norethindrone-Ethinyl  Estradiol-Fe Biphas (LO LOESTRIN FE) 1 MG-10 MCG / 10 MCG tablet Take 1 tablet by mouth daily.  3 Package  3  . pantoprazole (PROTONIX) 40 MG tablet Take 1 tablet (40 mg total) by mouth daily.  30 tablet  10  . doxycycline (VIBRA-TABS) 100 MG tablet Take 1 tablet (100 mg total) by mouth 2 (two) times daily.  20 tablet  0   No current facility-administered medications for this visit.    ROS: Pertinent items are noted in HPI.  Exam:    BP 110/70  Pulse 66  Resp 12  Ht 5\' 3"  (1.6 m)  Wt 130 lb (58.968 kg)  BMI 23.03 kg/m2  LMP 07/21/2013 Weight change: @WEIGHTCHANGE @ Last 3 height recordings:  Ht Readings from Last 3 Encounters:  11/28/13 5\' 3"  (1.6 m)  11/11/13 5' 3.5" (1.613 m)  10/23/13 5' 4.5" (1.638 m)   General appearance: alert, cooperative and appears stated age Head: Normocephalic, without obvious abnormality, atraumatic Neck: no adenopathy, no carotid bruit, no JVD, supple, symmetrical, trachea midline and thyroid not enlarged, symmetric, no tenderness/mass/nodules Lungs: clear to auscultation bilaterally Breasts: normal appearance, no masses or tenderness Heart: regular  rate and rhythm, S1, S2 normal, no murmur, click, rub or gallop Abdomen: soft, non-tender; bowel sounds normal; no masses,  no organomegaly Extremities: extremities normal, atraumatic, no cyanosis or edema Skin: Skin color, texture, turgor normal. No rashes or lesions Lymph nodes: Cervical, supraclavicular, and axillary nodes normal. no inguinal nodes palpated Neurologic: Grossly normal   Pelvic: External genitalia:  no lesions              Urethra: normal appearing urethra with no masses, tenderness or lesions              Bartholins and Skenes: Bartholin's, Urethra, Skene's normal                 Vagina: normal appearing vagina with normal color and discharge, no lesions              Cervix: normal appearance              Pap taken: No.        Bimanual Exam:  Uterus:  enlarged to 10 week's size,  irregular, fixed                                      Adnexa:    no masses                                      Rectovaginal: Confirms                                      Anus:  normal sphincter tone, no lesions         1. Routine gynecological examination counseled on breast self exam, mammography screening-overdue, list of centers given adequate intake of calcium and vitamin D, diet and exercise return annually or prn  2. Laboratory examination ordered as part of a routine general medical examination  - POCT Urinalysis Dipstick - Hemoglobin, fingerstick - Vitamin B12 - Lipid panel  3. Intramural leiomyoma of uterus  - Norethindrone-Ethinyl Estradiol-Fe Biphas (LO LOESTRIN FE) 1 MG-10 MCG / 10 MCG tablet; Take 1 tablet by mouth daily.  Dispense: 3 Package; Refill: 3  4. Menorrhagia with regular cycle Doing well on ocp control,  - Norethindrone-Ethinyl Estradiol-Fe Biphas (LO LOESTRIN FE) 1 MG-10 MCG / 10 MCG tablet; Take 1 tablet by mouth daily.  Dispense: 3 Package; Refill: 3  5. Hemorrhoids, unspecified hemorrhoid type  - hydrocortisone (ANUSOL-HC) 25 MG suppository; Place 1 suppository (25 mg total) rectally 2 (two) times daily.  Dispense: 12 suppository; Refill: 2  6. Unspecified essential hypertension  - enalapril (VASOTEC) 5 MG tablet; Take 1 tablet (5 mg total) by mouth daily.  Dispense: 30 tablet; Refill: 12 - Comprehensive metabolic panel  7. Gastroesophageal reflux disease without esophagitis  - pantoprazole (PROTONIX) 40 MG tablet; Take 1 tablet (40 mg total) by mouth daily.  Dispense: 30 tablet; Refill: 11 - Vitamin B12   An After Visit Summary was printed and given to the patient.   Pt reports after visit that she has been seeing Dr Amil Amen regarding bilateral shoulder pain, some morning to point that she cannot raise her arms, in addition a numb spot on her back-she did not mention family history of AVM, last MRA over 5y ago,  was to repeat in 5y. Tc to  Dr Amil Amen, unsure if related to her symptoms but will follow up

## 2013-12-03 ENCOUNTER — Telehealth: Payer: Self-pay | Admitting: Gynecology

## 2013-12-03 NOTE — Telephone Encounter (Signed)
Patient says she was here last week and the pharmacy did not get her blood pressure prescription.

## 2013-12-03 NOTE — Telephone Encounter (Signed)
Spoke with Altha Harm at [facility]. Order call in for vasotec 5 mg #30 12RF take one tablet by mouth daily as this order was printed and not sent electronically on 9/9. Left detailed message at number provided [phone]. Advised patient rx was called in to [facility] today. Advised to call back with any further questions or if there are any problems.   Routing to provider for final review. Patient agreeable to disposition. Will close encounter

## 2013-12-05 ENCOUNTER — Ambulatory Visit
Admission: RE | Admit: 2013-12-05 | Discharge: 2013-12-05 | Disposition: A | Discharge status: Home / Self Care | Payer: Managed Care, Other (non HMO) | Source: Ambulatory Visit | Attending: Internal Medicine | Admitting: Internal Medicine

## 2013-12-05 DIAGNOSIS — M25511 Pain in right shoulder: Secondary | ICD-10-CM

## 2013-12-10 ENCOUNTER — Ambulatory Visit (INDEPENDENT_AMBULATORY_CARE_PROVIDER_SITE_OTHER): Payer: Managed Care, Other (non HMO) | Admitting: Neurology

## 2013-12-10 ENCOUNTER — Encounter: Payer: Self-pay | Admitting: Neurology

## 2013-12-10 VITALS — BP 130/82 | HR 86 | Ht 63.0 in | Wt 131.0 lb

## 2013-12-10 DIAGNOSIS — M25519 Pain in unspecified shoulder: Secondary | ICD-10-CM

## 2013-12-10 DIAGNOSIS — M545 Low back pain, unspecified: Secondary | ICD-10-CM | POA: Insufficient documentation

## 2013-12-10 DIAGNOSIS — I671 Cerebral aneurysm, nonruptured: Secondary | ICD-10-CM | POA: Insufficient documentation

## 2013-12-10 DIAGNOSIS — M79605 Pain in left leg: Secondary | ICD-10-CM | POA: Insufficient documentation

## 2013-12-10 NOTE — Patient Instructions (Signed)
-   will do MRI c-spine to rule out syringomyelia - will do EMG/NCS for arms and legs to evaluate radiculopathy - will do MRA head to follow up brain aneurysm - follow up in one month.

## 2013-12-10 NOTE — Progress Notes (Addendum)
NEUROLOGY CLINIC NEW PATIENT NOTE  NAME: Sharon Mendoza DOB: 28-Jun-1970  I saw Ceasar Mons as a new patient in the neurovascular clinic today regarding  Chief Complaint  Patient presents with  . Neurologic Problem    NP# 1  .  HPI: Sharon Mendoza is a 43 y.o. female with PMH of HTN and small cerebral aneurysm who presents as a new patient for Aneurysm followup and bilateral shoulder pain.   Patient stated that for the last 10 months, she started to have bilateral shoulder pain. Initially, she had bilateral shoulder pain and night, and she was not able get sleep. Over the course she started to have a bilateral shoulder pain on waking up in the morning, not able lifting up with shoulder due to pain. This will last about 2-3 days, then resolved. The pain may fluctuate during the course but still constant. The episodes occurs about twice a month. During the interview time, there is no shoulder pain and she can act as normal. She denies any numbness, tingling, vision changes, speech problems, sensation loss, headache, fever. She also denies any swelling, trauma, rashes in shoulders. She was followed by rheumatologist Dr. Amil Amen as outpatient, had autoimmune workup with normal RF, CRP, CCP, ESR and HLA-B27. She also had prednisone shot in both shoulders which were effective to control the pain for 1 month, therefore her pain was considered as busitis.  But due to the negative workup, Dr. Amil Amen did not continue the steroid shot. She was referred to neurology for further workup.  For the last 3 months, she started to have lower back pain in the middle of the lower back, pain was dull, constant, with numbness at the center of the lower back. She also had a one day of left side sciatica, with pain in the buttock and radiating to the back of the left leg. She also had right hip pain for over a year when she standing up after long sitting.  She has a significant family history of cerebral  aneurysm. One of her brothers died of a aneurysm rupture at age 58. 42 of her siblings have aneurysms and were removed. She also had a examination about 8 years ago confirmed she had a small aneurysm, but she doesn't know where the location of the aneurysm is.   She history of hypertension, on enalapril. Currently her blood pressure is controlled, at home and pending from 120 to 130. Today in clinic her blood pressure 130/82.  She denies any smoking cigarettes, alcohol, illicit drug usage.   Past Medical History  Diagnosis Date  . Hypertension   . Mitral valve regurgitation   . Gastroesophageal reflux   . Abnormal pap   . Chest pain radiating to arm    Past Surgical History  Procedure Laterality Date  . Cesarean section  09/2005   Family History  Problem Relation Age of Onset  . Hypertension Mother   . Liver disease Mother   . Cancer Father     colon  . Heart disease Brother   . Aneurysm Brother   . Heart attack Brother   . Breast cancer Brother   . Asthma Son   . Aneurysm Sister   . Aneurysm Brother   . Aneurysm Sister   . Heart attack Sister   . Aneurysm Sister    Current Outpatient Prescriptions  Medication Sig Dispense Refill  . aspirin EC 81 MG tablet Take 81 mg by mouth daily as needed for mild pain.       Marland Kitchen  cetirizine (ZYRTEC) 10 MG tablet Take 10 mg by mouth daily.      . enalapril (VASOTEC) 5 MG tablet Take 1 tablet (5 mg total) by mouth daily.  30 tablet  12  . hydrocortisone (ANUSOL-HC) 25 MG suppository Place 1 suppository (25 mg total) rectally 2 (two) times daily.  12 suppository  2  . meloxicam (MOBIC) 15 MG tablet daily.      . Norethindrone-Ethinyl Estradiol-Fe Biphas (LO LOESTRIN FE) 1 MG-10 MCG / 10 MCG tablet Take 1 tablet by mouth daily.  3 Package  3  . pantoprazole (PROTONIX) 40 MG tablet Take 1 tablet (40 mg total) by mouth daily.  30 tablet  11   No current facility-administered medications for this visit.   No Known Allergies History   Social  History  . Marital Status: Single    Spouse Name: N/A    Number of Children: 1  . 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 Smoking status: Never Smoker   . Smokeless tobacco: Not on file  . Alcohol Use: No  . Drug Use: No  . Sexual Activity: Yes    Birth Control/ Protection: Pill     Comment: Loestrin   Other Topics Concern  . Not on file   Social History Narrative  . No narrative on file    Review of Systems Full 14 system review of systems performed and notable only for those listed, all others are neg:  Constitutional: Weight gain  Cardiovascular: N/A  Ear/Nose/Throat: N/A  Skin: N/A  Eyes: N/A  Respiratory: N/A  Gastroitestinal: N/A  Hematology/Lymphatic: N/A  Endocrine: N/A  Musculoskeletal: Joint pain, aching muscles  Allergy/Immunology: N/A  Neurological: Numbness and weakness, insomnia  Psychiatric: N/A   Physical Exam  Filed Vitals:   12/10/13 1529  BP: 130/82  Pulse: 86    General - Well nourished, well developed, in no apparent distress.  Ophthalmologic - Sharp disc margins OU.  Cardiovascular - Regular rate and rhythm with no murmur. Carotid pulses were 2+ without bruits .   Neck - supple, no nuchal rigidity.  Musculoskeletal - No shoulder pain today. Still has LBP and mild tenderness on palpation at L3-4 vertebral body.   Mental Status -  Level of arousal and orientation to time, place, and person were intact. Language including expression, naming, repetition, comprehension, reading, and writing was assessed and found intact. Attention span and concentration were normal. Recent and remote memory were intact. Fund of Knowledge was assessed and was intact.  Cranial Nerves II - XII - II - Visual field intact OU. III, IV, VI - Extraocular movements intact. V - Facial sensation intact bilaterally. VII - Facial movement intact bilaterally. VIII - Hearing & vestibular intact  bilaterally. X - Palate elevates symmetrically. XI - Chin turning & shoulder shrug intact bilaterally. XII - Tongue protrusion intact.  Motor Strength - The patient's strength was normal in all extremities and pronator drift was absent.  Bulk was normal and fasciculations were absent.   Motor Tone - Muscle tone was assessed at the neck and appendages and was normal.  Reflexes - The patient's reflexes were normal in all extremities and she had no pathological reflexes.  Sensory - Light touch, temperature/pinprick, vibration and proprioception, and Romberg testing were assessed and were normal, except mild numbness at surrounding area of L3-4 vertebral body.    Coordination - The patient had normal movements in the hands  and feet with no ataxia or dysmetria.  Tremor was absent.  Gait and Station - The patient's transfers, posture, gait, station, and turns were observed as normal.   Left straight leg test positive, right negative.   Imaging  12/05/13 - right shoulder MRI 1. Partial thickness bursal surface tear of the peripheral supraspinatus and infraspinatus tendons  Lab Review  normal RF, CRP, CCP, ESR and HLA-B27 as per chart  Component     Latest Ref Rng 11/28/2013  Sodium     135 - 145 mEq/L 137  Potassium     3.5 - 5.3 mEq/L 4.4  Chloride     96 - 112 mEq/L 105  CO2     19 - 32 mEq/L 26  Glucose     70 - 99 mg/dL 74  BUN     6 - 23 mg/dL 13  Creatinine     0.50 - 1.10 mg/dL 0.73  Total Bilirubin     0.2 - 1.2 mg/dL 0.3  Alkaline Phosphatase     39 - 117 U/L 45  AST     0 - 37 U/L 10  ALT     0 - 35 U/L <8  Total Protein     6.0 - 8.3 g/dL 6.2  Albumin     3.5 - 5.2 g/dL 3.7  Calcium     8.4 - 10.5 mg/dL 9.2  Cholesterol     0 - 200 mg/dL 161  Triglycerides     <150 mg/dL 91  HDL     >39 mg/dL 55  Total CHOL/HDL Ratio      2.9  VLDL     0 - 40 mg/dL 18  LDL (calc)     0 - 99 mg/dL 88  Vitamin B-12     211 - 911 pg/mL 391      Assessment and  Plan:   In summary, Sharon Mendoza is a 43 y.o. female with PMH of HTN and small cerebral aneurysm presents with b/l episodic shoulder pain and constant LBP. 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 MRI right shoulder showed small tear of rotator cuff and prednisone shot was effective in the past, consistent with orthopedic etiology. Rheumatology work up negative so far. Neuro exam normal today. Intact sensory did not support syringomyelia, and ESR normal ruled out rheumatoid polyarthritis. Will do MRI C-spine to rule out syringomyelia and EMG/NCS to evaluate radiculopathy vs. Neuropathy. For cerebral aneurysm, will do MRA.    - will do MRI c-spine to rule out syringomyelia - will do EMG/NCS for arms and legs to evaluate radiculopathy vs. neuropathy - will do MRA head to follow up brain aneurysm - RTC in one month.  Thank you very much for the opportunity to participate in the care of this patient.  Please do not hesitate to call if any questions or concerns arise.  Orders Placed This Encounter  Procedures  . MR Cervical Spine W Wo Contrast    Standing Status: Future     Number of Occurrences:      Standing Expiration Date: 02/11/2015    Order Specific Question:  Reason for Exam (SYMPTOM  OR DIAGNOSIS REQUIRED)    Answer:  episodic bilateral shoulder pain and weakness to rule out syringomyelia    Order Specific Question:  Preferred imaging location?    Answer:  Internal    Order Specific Question:  Does the patient have  a pacemaker or implanted devices?    Answer:  No    Order Specific Question:  What is the patient's sedation requirement?    Answer:  No Sedation  . MR MRA HEAD WO CONTRAST    Standing Status: Future     Number of Occurrences:      Standing Expiration Date: 02/10/2015    Order Specific Question:  Reason for Exam (SYMPTOM  OR DIAGNOSIS REQUIRED)    Answer:  hx of small aneurysm and  follow up    Order Specific Question:  Preferred imaging location?    Answer:  Internal    Order Specific Question:  Does the patient have a pacemaker or implanted devices?    Answer:  No    Order Specific Question:  What is the patient's sedation requirement?    Answer:  No Sedation  . NCV with EMG(electromyography)    Standing Status: Future     Number of Occurrences:      Standing Expiration Date: 12/11/2014    Scheduling Instructions:     Bilateral shoulder pain and weakness as well as LBP radiating to left LE. Please do b/l UEs and LEs. Thanks.    Order Specific Question:  Where should this test be performed?    Answer:  [facility]    Patient Instructions  - will do MRI c-spine to rule out syringomyelia - will do EMG/NCS for arms and legs to evaluate radiculopathy - will do MRA head to follow up brain aneurysm - follow up in one month.   Rosalin Hawking, MD PhD [facility] [address] [place], [place] [address] [phone]

## 2013-12-12 ENCOUNTER — Encounter (INDEPENDENT_AMBULATORY_CARE_PROVIDER_SITE_OTHER): Payer: Self-pay | Admitting: Radiology

## 2013-12-12 ENCOUNTER — Ambulatory Visit (INDEPENDENT_AMBULATORY_CARE_PROVIDER_SITE_OTHER): Payer: Managed Care, Other (non HMO) | Admitting: Neurology

## 2013-12-12 DIAGNOSIS — M545 Low back pain, unspecified: Secondary | ICD-10-CM

## 2013-12-12 DIAGNOSIS — IMO0001 Reserved for inherently not codable concepts without codable children: Secondary | ICD-10-CM

## 2013-12-12 DIAGNOSIS — Z0289 Encounter for other administrative examinations: Secondary | ICD-10-CM

## 2013-12-12 DIAGNOSIS — M79605 Pain in left leg: Secondary | ICD-10-CM

## 2013-12-12 DIAGNOSIS — M25519 Pain in unspecified shoulder: Secondary | ICD-10-CM

## 2013-12-12 DIAGNOSIS — M79609 Pain in unspecified limb: Secondary | ICD-10-CM

## 2013-12-12 DIAGNOSIS — M79622 Pain in left upper arm: Secondary | ICD-10-CM

## 2013-12-12 NOTE — Procedures (Signed)
HISTORY:  Sharon Mendoza is a 43 year old patient with a 10 month history of some intermittent bilateral shoulder discomfort, and some low back pain on the left with some discomfort down the left leg to the knee. The patient denies any sensory alteration or severe weakness of the extremities, but she feels weak intermittently in the arms and legs. She is being evaluated for possible neuropathy or a radiculopathy.  NERVE CONDUCTION STUDIES:  Nerve conduction studies were performed on both upper extremities. The distal motor latencies and motor amplitudes for the median and ulnar nerves were within normal limits. The F wave latencies and nerve conduction velocities for these nerves were also normal. The sensory latencies for the median, radial, and ulnar nerves were normal.  Nerve conduction studies were performed on the left lower extremity. The distal motor latencies and motor amplitudes for the peroneal and posterior tibial nerves were within normal limits. The nerve conduction velocities for these nerves were also normal. The H reflex latencies were normal. The F wave latencies for the left peroneal and posterior tibial nerves were normal. The sensory latencies for the left peroneal and sural nerves were within normal limits.   EMG STUDIES:  EMG study was performed on the left upper extremity:  The first dorsal interosseous muscle reveals 2 to 4 K units with full recruitment. No fibrillations or positive waves were noted. The abductor pollicis brevis muscle reveals 2 to 4 K units with full recruitment. No fibrillations or positive waves were noted. The extensor indicis proprius muscle reveals 1 to 3 K units with full recruitment. No fibrillations or positive waves were noted. The pronator teres muscle reveals 2 to 3 K units with full recruitment. No fibrillations or positive waves were noted. The biceps muscle reveals 1 to 2 K units with full recruitment. No fibrillations or positive  waves were noted. The triceps muscle reveals 2 to 4 K units with full recruitment. No fibrillations or positive waves were noted. The anterior deltoid muscle reveals 2 to 3 K units with full recruitment. No fibrillations or positive waves were noted. The cervical paraspinal muscles were tested at 2 levels. No abnormalities of insertional activity were seen at either level tested. There was good relaxation.  EMG study was performed on the left lower extremity:  The tibialis anterior muscle reveals 2 to 4K motor units with full recruitment. No fibrillations or positive waves were seen. The peroneus tertius muscle reveals 2 to 4K motor units with full recruitment. No fibrillations or positive waves were seen. The medial gastrocnemius muscle reveals 1 to 3K motor units with full recruitment. No fibrillations or positive waves were seen. The vastus lateralis muscle reveals 2 to 4K motor units with full recruitment. No fibrillations or positive waves were seen. The iliopsoas muscle reveals 2 to 4K motor units with full recruitment. No fibrillations or positive waves were seen. The biceps femoris muscle (long head) reveals 2 to 4K motor units with full recruitment. No fibrillations or positive waves were seen. The lumbosacral paraspinal muscles were tested at 3 levels, and revealed no abnormalities of insertional activity at all 3 levels tested. There was good relaxation.   IMPRESSION:  Nerve conduction studies done on both upper extremities and on the left lower extremity were unremarkable, without evidence of an underlying peripheral neuropathy. EMG evaluation of the left upper and left lower extremities were unremarkable, without evidence of a left cervical or lumbosacral radiculopathy.  Jill Alexanders MD 12/12/2013 4:43 PM  [facility]  [address] [place], [place] [address]  Phone [phone] Fax [phone]

## 2013-12-19 ENCOUNTER — Ambulatory Visit (INDEPENDENT_AMBULATORY_CARE_PROVIDER_SITE_OTHER): Payer: Managed Care, Other (non HMO)

## 2013-12-19 DIAGNOSIS — M25519 Pain in unspecified shoulder: Secondary | ICD-10-CM

## 2013-12-19 DIAGNOSIS — I671 Cerebral aneurysm, nonruptured: Secondary | ICD-10-CM

## 2013-12-19 MED ORDER — GADOPENTETATE DIMEGLUMINE 469.01 MG/ML IV SOLN: 13.0000 mL | Freq: Once | INTRAVENOUS | Status: AC | PRN

## 2013-12-21 ENCOUNTER — Telehealth: Payer: Self-pay | Admitting: *Deleted

## 2013-12-21 NOTE — Telephone Encounter (Signed)
Called patient, left voice message of normal MRI & MRA, and to call the office back with any questions or concerns.

## 2013-12-21 NOTE — Telephone Encounter (Signed)
Message copied by Emeline General on Fri Dec 21, 2013  9:27 AM ------      Message from: Rosalin Hawking      Created: Thu Dec 20, 2013 10:48 PM       Hello, could you please let the pt know that her MRI brain and MRA head are largely normal? Thanks.             Rosalin Hawking, MD PhD      Stroke Neurology      12/20/2013      10:47 PM             ------

## 2014-01-09 ENCOUNTER — Ambulatory Visit: Payer: Managed Care, Other (non HMO) | Admitting: Neurology

## 2014-01-21 ENCOUNTER — Encounter: Payer: Self-pay | Admitting: Neurology

## 2014-02-20 ENCOUNTER — Telehealth: Payer: Self-pay

## 2014-02-20 NOTE — Telephone Encounter (Signed)
lmtcb to reschedule AEX with Dr. Lathrop 

## 2014-05-08 ENCOUNTER — Other Ambulatory Visit: Payer: Self-pay | Admitting: *Deleted

## 2014-05-08 DIAGNOSIS — K219 Gastro-esophageal reflux disease without esophagitis: Secondary | ICD-10-CM

## 2014-05-08 NOTE — Telephone Encounter (Signed)
Incoming fax from [employer] requesting Pantoprazole 40 mg  Medication refill request: Pantoprazole 40 mg Last AEX:  11/28/13 TL Next AEX: Not schedule  Last MMG (if hormonal medication request): ?  Refill authorized: 11/28/13 #[id number] to [employer].    Faxed back - Denied, pt should have refills left.

## 2014-06-26 ENCOUNTER — Ambulatory Visit (INDEPENDENT_AMBULATORY_CARE_PROVIDER_SITE_OTHER): Payer: 59 | Admitting: Family Medicine

## 2014-06-26 VITALS — BP 130/80 | HR 75 | Temp 98.6°F | Resp 16 | Ht 63.5 in | Wt 129.0 lb

## 2014-06-26 DIAGNOSIS — J209 Acute bronchitis, unspecified: Secondary | ICD-10-CM | POA: Diagnosis not present

## 2014-06-26 DIAGNOSIS — R05 Cough: Secondary | ICD-10-CM

## 2014-06-26 DIAGNOSIS — R0602 Shortness of breath: Secondary | ICD-10-CM

## 2014-06-26 MED ORDER — AZITHROMYCIN 250 MG PO TABS: ORAL_TABLET | ORAL | Status: DC

## 2014-06-26 MED ORDER — HYDROCODONE-HOMATROPINE 5-1.5 MG/5ML PO SYRP: 5.0000 mL | ORAL_SOLUTION | Freq: Three times a day (TID) | ORAL | Status: DC | PRN

## 2014-06-26 NOTE — Progress Notes (Signed)
Patient ID: Sharon Mendoza, female   DOB: 11/24/1970, 44 y.o.   MRN: [id number]   This chart was scribed for Robyn Haber, MD by [facility], medical scribe at [facility].The patient was seen in exam room 01 and the patient's care was started at 4:53 PM.  Patient ID: Sharon Mendoza MRN: [id number], DOB: [birth year], 44 y.o. Date of Encounter: 06/26/2014  Primary Physician: Elveria Rising, MD  Chief Complaint:  Chief Complaint  Patient presents with  . Cough    Onset 1 week   HPI:  Sharon Mendoza is a 44 y.o. female who presents to [facility] complaining of acute illness onset 1 week ago with an associated cough, SOB, epistaxis. Previously had fever but not today. Initially had sore throat and drainage. She has taken vitamin C tablets and nebulizer for relief. She has gotten the flu shot this year. She does not smoke. Sister was seen earlier today and diagnosed with pneumonia. Pt has a history of bronchitis and typically gets it once a year. Her son has asthma and she is concerned because of her bronchitis and her sisters pneumonia.  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 Past Medical History  Diagnosis Date  . Hypertension   . Mitral valve regurgitation   . Gastroesophageal reflux   . Abnormal pap   . Chest pain radiating to arm     Home Meds: Prior to Admission medications   Medication Sig Start Date End Date Taking? Authorizing Provider  aspirin EC 81 MG tablet Take 81 mg by mouth daily as needed for mild pain.    Yes Historical Provider, MD  cetirizine (ZYRTEC) 10 MG tablet Take 10 mg by mouth daily.   Yes Historical Provider, MD  enalapril (VASOTEC) 5 MG tablet Take 1 tablet (5 mg total) by mouth daily. 11/28/13  Yes Elveria Rising, MD  Norethindrone-Ethinyl Estradiol-Fe Biphas (LO LOESTRIN FE) 1 MG-10 MCG / 10 MCG tablet Take 1 tablet by mouth daily. 11/28/13  Yes Elveria Rising, MD  pantoprazole (PROTONIX) 40 MG tablet Take 1  tablet (40 mg total) by mouth daily. 11/28/13  Yes Elveria Rising, MD   Allergies: No Known Allergies  History   Social History  . Marital Status: Single    Spouse Name: N/A  . Number of Children: 1  . 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 Smoking status: Never Smoker   . Smokeless tobacco: Not on file  . Alcohol Use: No  . Drug Use: No  . Sexual Activity: Yes    Birth Control/ Protection: Pill     Comment: Loestrin   Other Topics Concern  . Not on file   Social History Narrative    Review of Systems: Constitutional: negative for chills, fever, night sweats, weight changes, or fatigue  HEENT: negative for vision changes, hearing loss, congestion, rhinorrhea, ST, or sinus pressure. Positive for epistaxis. Cardiovascular: negative for chest pain or palpitations Respiratory: negative for hemoptysis, wheezing. Positive shortness of breath and cough. Abdominal: negative for abdominal pain, nausea, vomiting, diarrhea, or constipation Msk: Dermatological: negative for rash Neurologic: negative for headache, dizziness, or syncope All other systems reviewed and are otherwise negative with the exception to those above and in the HPI.  Physical Exam: Blood pressure 130/80, pulse 75, temperature 98.6 F (37 C), temperature source Oral, resp. rate 16, height 5' 3.5" (1.613 m), weight 129 lb (58.514 kg),  SpO2 98 %., Body mass index is 22.49 kg/(m^2). General: Well developed, well nourished, in no acute distress. Head: Normocephalic, atraumatic, eyes without discharge, sclera non-icteric, nares are without discharge. Bilateral auditory canals clear, TM's are without perforation, pearly grey and translucent with reflective cone of light bilaterally. Oral cavity moist, posterior pharynx without exudate, erythema, peritonsillar abscess, or post nasal drip.  Neck: Supple. No thyromegaly. Full ROM. No lymphadenopathy. Lungs:  Clear bilaterally to auscultation without wheezes, rales, or rhonchi. Breathing is unlabored. Heart: RRR with S1 S2. No murmurs, rubs, or gallops appreciated. Abdomen: Soft, non-tender, non-distended with normoactive bowel sounds. No hepatomegaly. No rebound/guarding. No obvious abdominal masses. Msk:  Strength and tone normal for age. Extremities/Skin: Warm and dry. No clubbing or cyanosis. No edema. No rashes or suspicious lesions. Neuro: Alert and oriented X 3. Moves all extremities spontaneously. Gait is normal. CNII-XII grossly in tact. Psych:  Responds to questions appropriately with a normal affect.   Labs:  ASSESSMENT AND PLAN:  44 y.o. year old female with   This chart was scribed in my presence and reviewed by me personally.    ICD-9-CM ICD-10-CM   1. Acute bronchitis, unspecified organism 466.0 J20.9 azithromycin (ZITHROMAX Z-PAK) 250 MG tablet     HYDROcodone-homatropine (HYCODAN) 5-1.5 MG/5ML syrup     Signed, Robyn Haber, MD  Signed, Robyn Haber, MD 06/26/2014 4:53 PM

## 2014-06-26 NOTE — Patient Instructions (Signed)

## 2014-10-04 ENCOUNTER — Other Ambulatory Visit (HOSPITAL_COMMUNITY): Payer: Self-pay | Admitting: Interventional Radiology

## 2014-10-04 ENCOUNTER — Telehealth (HOSPITAL_COMMUNITY): Payer: Self-pay | Admitting: Interventional Radiology

## 2014-10-04 DIAGNOSIS — I671 Cerebral aneurysm, nonruptured: Secondary | ICD-10-CM

## 2014-10-04 NOTE — Telephone Encounter (Signed)
Called pt, left a VM with her appointment time and instructions for her cerebral angiogram scheduled for 10/11/14. Told patient to call me back to verify. JM

## 2014-10-10 ENCOUNTER — Other Ambulatory Visit: Payer: Self-pay | Admitting: Radiology

## 2014-10-11 ENCOUNTER — Ambulatory Visit (HOSPITAL_COMMUNITY): Admission: RE | Admit: 2014-10-11 | Payer: Managed Care, Other (non HMO) | Source: Ambulatory Visit

## 2014-10-18 ENCOUNTER — Ambulatory Visit (HOSPITAL_COMMUNITY): Payer: Managed Care, Other (non HMO)

## 2014-10-21 ENCOUNTER — Ambulatory Visit (INDEPENDENT_AMBULATORY_CARE_PROVIDER_SITE_OTHER): Payer: 59 | Admitting: Physician Assistant

## 2014-10-21 VITALS — BP 130/80 | HR 87 | Temp 99.1°F | Resp 16 | Ht 63.5 in | Wt 131.8 lb

## 2014-10-21 DIAGNOSIS — M545 Low back pain, unspecified: Secondary | ICD-10-CM

## 2014-10-21 DIAGNOSIS — N3090 Cystitis, unspecified without hematuria: Secondary | ICD-10-CM | POA: Diagnosis not present

## 2014-10-21 LAB — POCT URINALYSIS DIPSTICK
BILIRUBIN UA: NEGATIVE
Blood, UA: NEGATIVE
GLUCOSE UA: NEGATIVE
NITRITE UA: NEGATIVE
PH UA: 7.5
Protein, UA: NEGATIVE
Spec Grav, UA: 1.015
UROBILINOGEN UA: 0.2

## 2014-10-21 LAB — POCT UA - MICROSCOPIC ONLY
CASTS, UR, LPF, POC: NEGATIVE
CRYSTALS, UR, HPF, POC: NEGATIVE
Yeast, UA: NEGATIVE

## 2014-10-21 MED ORDER — NITROFURANTOIN MONOHYD MACRO 100 MG PO CAPS: 100.0000 mg | ORAL_CAPSULE | Freq: Two times a day (BID) | ORAL | Status: AC

## 2014-10-21 MED ORDER — CYCLOBENZAPRINE HCL 10 MG PO TABS: 10.0000 mg | ORAL_TABLET | Freq: Every day | ORAL | Status: DC

## 2014-10-21 NOTE — Progress Notes (Signed)
[facility] [address], [place] [address] [phone]  Date:  10/21/2014   Name:  Sharon Mendoza   DOB:  10/27/1970   MRN:  [id number]  PCP:  Elveria Rising, MD    Chief Complaint: Back Pain   History of Present Illness:  This is a 44 y.o. female with PMH brain aneurysm and fibroid uterus who is presenting with 2 weeks of low back pain. Pain started on left side and is now bilateral. Pain described as constant aching. Certain movements cause sharp pain. Standing gives her the most relief. She is unable to sleep d/t pain. Pressing on the areas feels better. No known injury.  Radiation of pain: no Paresthesias: no Weakness: no Problems with bowel or bladder: No. Denies dysuria, urinary frequency. Last UTI 8 years ago. Usually with UTIs she has dysuria and urinary frequency, not just back pain. Previous back injury: has had a bulging disc in her neck. No lumbar injury before. No abdominal pain, n/v, vaginal sx. Tried heat which did not help. Tried tylenol which helped some.  Review of Systems:  Review of Systems See HPI  Patient Active Problem List   Diagnosis Date Noted  . Pain in joint, shoulder region 12/10/2013  . LBP radiating to left leg 12/10/2013  . Aneurysm, cerebral, nonruptured 12/10/2013  . Fibroid uterus 06/22/2012  . Menorrhagia 06/22/2012  . Dysmenorrhea 06/22/2012    Prior to Admission medications   Medication Sig Start Date End Date Taking? Authorizing Provider  aspirin EC 81 MG tablet Take 81-162 mg by mouth daily as needed for mild pain.    Yes Historical Provider, MD  cetirizine (ZYRTEC) 10 MG tablet Take 10 mg by mouth daily.   Yes Historical Provider, MD  enalapril (VASOTEC) 5 MG tablet Take 1 tablet (5 mg total) by mouth daily. 11/28/13  Yes Elveria Rising, MD  Naphazoline HCl (CLEAR EYES OP) Place 2 drops into both eyes as needed (for dry eyes).   Yes Historical Provider, MD  Norethindrone-Ethinyl Estradiol-Fe Biphas (LO  LOESTRIN FE) 1 MG-10 MCG / 10 MCG tablet Take 1 tablet by mouth daily. 11/28/13  Yes Elveria Rising, MD  pantoprazole (PROTONIX) 40 MG tablet Take 1 tablet (40 mg total) by mouth daily. 11/28/13  Yes Elveria Rising, MD  Vitamin D, Cholecalciferol, 1000 UNITS TABS Take 2,000 Units by mouth daily.   Yes Historical Provider, MD    No Known Allergies  Past Surgical History  Procedure Laterality Date  . Cesarean section  09/2005    History  Substance Use Topics  . Smoking status: Never Smoker   . Smokeless tobacco: Not on file  . Alcohol Use: No    Family History  Problem Relation Age of Onset  . Hypertension Mother   . Liver disease Mother   . Cancer Father     colon  . Heart disease Brother   . Aneurysm Brother   . Heart attack Brother   . Breast cancer Brother   . Asthma Son   . Aneurysm Sister   . Aneurysm Brother   . Aneurysm Sister   . Heart attack Sister   . Aneurysm Sister     Medication list has been reviewed and updated.  Physical Examination:  Physical Exam  Constitutional: She is oriented to person, place, and time. She appears well-developed and well-nourished. No distress.  HENT:  Head: Normocephalic and atraumatic.  Right Ear: Hearing normal.  Left Ear: Hearing normal.  Nose: Nose normal.  Eyes: Conjunctivae and lids are normal. Right eye exhibits no discharge. Left eye exhibits no discharge. No scleral icterus.  Cardiovascular: Normal rate, regular rhythm, normal heart sounds and normal pulses.   No murmur heard. Pulmonary/Chest: Effort normal and breath sounds normal. No respiratory distress. She has no wheezes. She has no rhonchi. She has no rales.  Abdominal: Soft. Normal appearance. There is no tenderness. There is no CVA tenderness.  Suprapubic mass felt, known fibroids.  Musculoskeletal: Normal range of motion.       Lumbar back: She exhibits tenderness (mild, bilateral paraspinal). She exhibits normal range of motion and no bony tenderness.  Straight  leg raise negative.  Neurological: She is alert and oriented to person, place, and time. She has normal strength and normal reflexes. No sensory deficit. Gait normal.  Skin: Skin is warm, dry and intact. No lesion and no rash noted.  Psychiatric: She has a normal mood and affect. Her speech is normal and behavior is normal. Thought content normal.   BP 130/80 mmHg  Pulse 87  Temp(Src) 99.1 F (37.3 C) (Oral)  Resp 16  Ht 5' 3.5" (1.613 m)  Wt 131 lb 12.8 oz (59.784 kg)  BMI 22.98 kg/m2  SpO2 98%  Results for orders placed or performed in visit on 10/21/14  POCT UA - Microscopic Only  Result Value Ref Range   WBC, Ur, HPF, POC 8-12    RBC, urine, microscopic 1-3    Bacteria, U Microscopic trace    Mucus, UA trace    Epithelial cells, urine per micros 5-10    Crystals, Ur, HPF, POC neg    Casts, Ur, LPF, POC neg    Yeast, UA neg   POCT urinalysis dipstick  Result Value Ref Range   Color, UA yellow    Clarity, UA clear    Glucose, UA neg    Bilirubin, UA neg    Ketones, UA trace    Spec Grav, UA 1.015    Blood, UA neg    pH, UA 7.5    Protein, UA neg    Urobilinogen, UA 0.2    Nitrite, UA neg    Leukocytes, UA small (1+) (A) Negative    Assessment and Plan:  1. Cystitis 2. Bilateral low back pain without sciatica UA suggestive of cystitis. Urine culture pending. Macrobid sent to pharmacy. Counseled on hydration and RICE. Flexeril to take at night. If not improving in 4-5 days, or if symptoms worsen at any time, return to clinic. - nitrofurantoin, macrocrystal-monohydrate, (MACROBID) 100 MG capsule; Take 1 capsule (100 mg total) by mouth 2 (two) times daily.  Dispense: 14 capsule; Refill: 0 - Urine culture - POCT UA - Microscopic Only - POCT urinalysis dipstick - cyclobenzaprine (FLEXERIL) 10 MG tablet; Take 1 tablet (10 mg total) by mouth at bedtime.  Dispense: 30 tablet; Refill: 0   Benjaman Pott. Drenda Freeze, MHS [facility]  10/22/2014

## 2014-10-21 NOTE — Patient Instructions (Signed)
Take macrobid twice a day for 7 days. Take flexeril at night. If you are not getting better in 3-5 days, let me know.

## 2014-10-22 ENCOUNTER — Other Ambulatory Visit: Payer: Self-pay | Admitting: Radiology

## 2014-10-23 LAB — URINE CULTURE
Colony Count: NO GROWTH
ORGANISM ID, BACTERIA: NO GROWTH

## 2014-10-24 ENCOUNTER — Other Ambulatory Visit: Payer: Self-pay | Admitting: Radiology

## 2014-10-24 ENCOUNTER — Emergency Department (HOSPITAL_COMMUNITY)
Admission: EM | Admit: 2014-10-24 | Discharge: 2014-10-24 | Disposition: A | Discharge status: Home / Self Care | Payer: 59 | Attending: Emergency Medicine | Admitting: Emergency Medicine

## 2014-10-24 ENCOUNTER — Encounter (HOSPITAL_COMMUNITY): Payer: Self-pay | Admitting: *Deleted

## 2014-10-24 DIAGNOSIS — M545 Low back pain, unspecified: Secondary | ICD-10-CM

## 2014-10-24 DIAGNOSIS — Z79899 Other long term (current) drug therapy: Secondary | ICD-10-CM | POA: Insufficient documentation

## 2014-10-24 DIAGNOSIS — I10 Essential (primary) hypertension: Secondary | ICD-10-CM | POA: Insufficient documentation

## 2014-10-24 DIAGNOSIS — Z3202 Encounter for pregnancy test, result negative: Secondary | ICD-10-CM | POA: Diagnosis not present

## 2014-10-24 DIAGNOSIS — R000 Tachycardia, unspecified: Secondary | ICD-10-CM | POA: Diagnosis not present

## 2014-10-24 DIAGNOSIS — K219 Gastro-esophageal reflux disease without esophagitis: Secondary | ICD-10-CM | POA: Diagnosis not present

## 2014-10-24 DIAGNOSIS — Z792 Long term (current) use of antibiotics: Secondary | ICD-10-CM | POA: Diagnosis not present

## 2014-10-24 LAB — URINALYSIS, ROUTINE W REFLEX MICROSCOPIC
Bilirubin Urine: NEGATIVE
Glucose, UA: NEGATIVE mg/dL
HGB URINE DIPSTICK: NEGATIVE
KETONES UR: NEGATIVE mg/dL
Nitrite: NEGATIVE
Protein, ur: NEGATIVE mg/dL
Specific Gravity, Urine: 1.003 — ABNORMAL LOW (ref 1.005–1.030)
Urobilinogen, UA: 0.2 mg/dL (ref 0.0–1.0)
pH: 6.5 (ref 5.0–8.0)

## 2014-10-24 LAB — POC URINE PREG, ED: Preg Test, Ur: NEGATIVE

## 2014-10-24 LAB — URINE MICROSCOPIC-ADD ON

## 2014-10-24 MED ORDER — HYDROCODONE-ACETAMINOPHEN 5-325 MG PO TABS
2.0000 | ORAL_TABLET | Freq: Once | ORAL | Status: AC
  Administered 2014-10-24: 2 via ORAL
  Filled 2014-10-24: qty 2

## 2014-10-24 MED ORDER — HYDROCODONE-ACETAMINOPHEN 5-325 MG PO TABS: 1.0000 | ORAL_TABLET | Freq: Four times a day (QID) | ORAL | Status: DC | PRN

## 2014-10-24 NOTE — ED Provider Notes (Signed)
CSN: [id number]     Arrival date & time 10/24/14  0305 History   This chart was scribed for Serita Grit, MD by Forrestine Him, ED Scribe. This patient was seen in room A02C/A02C and the patient's care was started 3:25 AM.   Chief Complaint  Patient presents with  . Back Pain   The history is provided by the patient. No language interpreter was used.    HPI Comments: Sharon Mendoza is a 44 y.o. female without any pertinent past medical history who presents to the Emergency Department complaining of constant, ongoing lower back pain x 2 weeks. Pain initially started on the lower R side and radiated to the L side several days later. Discomfort is exacerbated with certain movements. States it is hard for her to get into a comfortable position. Sharon Mendoza [facility] ago and was sent home with prescriptions for Macrobid and Flexeril for a possible UTI. However, she denies any improvement. OTC Tylenol also attempted at home with no relief. Denies any fever, chills, dysuria, urinary frequency, urinary urgency, hematuria, or abdominal pain. No bowel or urinary incontinence. She denies any saddle anesthesia. No weakness or numbness to lower extremities. No known allergies to medications.  Past Medical History  Diagnosis Date  . Hypertension   . Mitral valve regurgitation   . Gastroesophageal reflux   . Abnormal pap   . Chest pain radiating to arm    Past Surgical History  Procedure Laterality Date  . Cesarean section  09/2005   Family History  Problem Relation Age of Onset  . Hypertension Mother   . Liver disease Mother   . Cancer Father     colon  . Heart disease Brother   . Aneurysm Brother   . Heart attack Brother   . Breast cancer Brother   . Asthma Son   . Aneurysm Sister   . Aneurysm Brother   . Aneurysm Sister   . Heart attack Sister   . Aneurysm Sister    History  Substance Use Topics  . Smoking status: Never Smoker   . Smokeless tobacco: Not on file  .  Alcohol Use: No   OB History    Gravida Para Term Preterm AB TAB SAB Ectopic Multiple Living   1 1        1      Review of Systems  Constitutional: Negative for fever and chills.  Respiratory: Negative for cough and shortness of breath.   Cardiovascular: Negative for chest pain.  Gastrointestinal: Negative for nausea, vomiting, abdominal pain and diarrhea.  Genitourinary: Negative for dysuria, urgency, frequency and hematuria.  Musculoskeletal: Positive for back pain.  Psychiatric/Behavioral: Negative for confusion.  All other systems reviewed and are negative.     Allergies  Review of patient's allergies indicates no known allergies.  Home Medications   Prior to Admission medications   Medication Sig Start Date End Date Taking? Authorizing Provider  aspirin EC 81 MG tablet Take 81-162 mg by mouth daily as needed for mild pain.     Historical Provider, MD  cetirizine (ZYRTEC) 10 MG tablet Take 10 mg by mouth daily.    Historical Provider, MD  cyclobenzaprine (FLEXERIL) 10 MG tablet Take 1 tablet (10 mg total) by mouth at bedtime. 10/21/14   Ezekiel Slocumb, PA-C  enalapril (VASOTEC) 5 MG tablet Take 1 tablet (5 mg total) by mouth daily. 11/28/13   Elveria Rising, MD  Naphazoline HCl (CLEAR EYES OP) Place 2 drops into  both eyes as needed (for dry eyes).    Historical Provider, MD  nitrofurantoin, macrocrystal-monohydrate, (MACROBID) 100 MG capsule Take 1 capsule (100 mg total) by mouth 2 (two) times daily. 10/21/14 10/28/14  Ezekiel Slocumb, PA-C  Norethindrone-Ethinyl Estradiol-Fe Biphas (LO LOESTRIN FE) 1 MG-10 MCG / 10 MCG tablet Take 1 tablet by mouth daily. 11/28/13   Elveria Rising, MD  pantoprazole (PROTONIX) 40 MG tablet Take 1 tablet (40 mg total) by mouth daily. 11/28/13   Elveria Rising, MD  Vitamin D, Cholecalciferol, 1000 UNITS TABS Take 2,000 Units by mouth daily.    Historical Provider, MD   Triage Vitals: BP 161/88 mmHg  Pulse 110  Temp(Src) 98.2 F (36.8 C) (Oral)  Resp 18  SpO2  99%   Physical Exam  Constitutional: She is oriented to person, place, and time. She appears well-developed and well-nourished. No distress.  HENT:  Head: Normocephalic and atraumatic.  Mouth/Throat: Oropharynx is clear and moist.  Eyes: Conjunctivae are normal. Pupils are equal, round, and reactive to light. No scleral icterus.  Neck: Neck supple.  Cardiovascular: Regular rhythm, normal heart sounds and intact distal pulses.  Tachycardia present.   No murmur heard. Pulmonary/Chest: Effort normal and breath sounds normal. No stridor. No respiratory distress. She has no rales.  Abdominal: Soft. Bowel sounds are normal. She exhibits no distension. There is no tenderness.  Musculoskeletal: Normal range of motion.       Lumbar back: She exhibits tenderness (left paraspinal and gluteus). She exhibits normal range of motion, no bony tenderness, no swelling and no deformity.  Neurological: She is alert and oriented to person, place, and time.  Normal strength, normal gait.   Skin: Skin is warm and dry. No rash noted.  Psychiatric: She has a normal mood and affect. Her behavior is normal.  Nursing note and vitals reviewed.   ED Course  Procedures (including critical care time)  DIAGNOSTIC STUDIES: Oxygen Saturation is 99% on RA, Normal by my interpretation.    COORDINATION OF CARE: 3:31 AM- Will order urinalysis. Will give Norco. Discussed treatment plan with pt at bedside and pt agreed to plan.     Labs Review Labs Reviewed  URINALYSIS, ROUTINE W REFLEX MICROSCOPIC (NOT AT [facility]) - Abnormal; Notable for the following:    APPearance CLOUDY (*)    Specific Gravity, Urine 1.003 (*)    Leukocytes, UA MODERATE (*)    All other components within normal limits  URINE MICROSCOPIC-ADD ON - Abnormal; Notable for the following:    Bacteria, UA FEW (*)    All other components within normal limits  POC URINE PREG, ED    Imaging Review No results found.   EKG Interpretation None       MDM   Final diagnoses:  Left-sided low back pain without sciatica    Low back pain on right.  No red flag signs or symptoms to suggest spinal cord pathology.  Pain improved with norco and tachycardia subsequently resolved.  UA negative.  Pain felt to be MSK in origin.  DC home with pcp follow up.    I personally performed the services described in this documentation, which was scribed in my presence. The recorded information has been reviewed and is accurate.     Serita Grit, MD 10/24/14 [phone]

## 2014-10-24 NOTE — Discharge Instructions (Signed)
Back Pain, Adult Low back pain is very common. About 1 in 5 people have back pain.The cause of low back pain is rarely dangerous. The pain often gets better over time.About half of people with a sudden onset of back pain feel better in just 2 weeks. About 8 in 10 people feel better by 6 weeks.  CAUSES Some common causes of back pain include:  Strain of the muscles or ligaments supporting the spine.  Wear and tear (degeneration) of the spinal discs.  Arthritis.  Direct injury to the back. DIAGNOSIS Most of the time, the direct cause of low back pain is not known.However, back pain can be treated effectively even when the exact cause of the pain is unknown.Answering your caregiver's questions about your overall health and symptoms is one of the most accurate ways to make sure the cause of your pain is not dangerous. If your caregiver needs more information, he or she may order lab work or imaging tests (X-rays or MRIs).However, even if imaging tests show changes in your back, this usually does not require surgery. HOME CARE INSTRUCTIONS For many people, back pain returns.Since low back pain is rarely dangerous, it is often a condition that people can learn to manageon their own.   Remain active. It is stressful on the back to sit or stand in one place. Do not sit, drive, or stand in one place for more than 30 minutes at a time. Take short walks on level surfaces as soon as pain allows.Try to increase the length of time you walk each day.  Do not stay in bed.Resting more than 1 or 2 days can delay your recovery.  Do not avoid exercise or work.Your body is made to move.It is not dangerous to be active, even though your back may hurt.Your back will likely heal faster if you return to being active before your pain is gone.  Pay attention to your body when you bend and lift. Many people have less discomfortwhen lifting if they bend their knees, keep the load close to their bodies,and  avoid twisting. Often, the most comfortable positions are those that put less stress on your recovering back.  Find a comfortable position to sleep. Use a firm mattress and lie on your side with your knees slightly bent. If you lie on your back, put a pillow under your knees.  Only take over-the-counter or prescription medicines as directed by your caregiver. Over-the-counter medicines to reduce pain and inflammation are often the most helpful.Your caregiver may prescribe muscle relaxant drugs.These medicines help dull your pain so you can more quickly return to your normal activities and healthy exercise.  Put ice on the injured area.  Put ice in a plastic bag.  Place a towel between your skin and the bag.  Leave the ice on for 15-20 minutes, 03-04 times a day for the first 2 to 3 days. After that, ice and heat may be alternated to reduce pain and spasms.  Ask your caregiver about trying back exercises and gentle massage. This may be of some benefit.  Avoid feeling anxious or stressed.Stress increases muscle tension and can worsen back pain.It is important to recognize when you are anxious or stressed and learn ways to manage it.Exercise is a great option. SEEK MEDICAL CARE IF:  You have pain that is not relieved with rest or medicine.  You have pain that does not improve in 1 week.  You have new symptoms.  You are generally not feeling well. SEEK   IMMEDIATE MEDICAL CARE IF:   You have pain that radiates from your back into your legs.  You develop new bowel or bladder control problems.  You have unusual weakness or numbness in your arms or legs.  You develop nausea or vomiting.  You develop abdominal pain.  You feel faint. Document Released: 03/08/2005 Document Revised: 09/07/2011 Document Reviewed: 07/10/2013 ExitCare Patient Information 2015 [employer]. This information is not intended to replace advice given to you by your health care provider. Make sure you  discuss any questions you have with your health care provider.  

## 2014-10-24 NOTE — ED Notes (Signed)
The pt is c/o lower back pain for 2 weeks .  She was seen on Monday at [facility] and given meds for a uti.  Her pains are worse lmp bcp

## 2014-10-25 ENCOUNTER — Encounter (HOSPITAL_COMMUNITY): Payer: Self-pay

## 2014-10-25 ENCOUNTER — Other Ambulatory Visit (HOSPITAL_COMMUNITY): Payer: Self-pay | Admitting: Interventional Radiology

## 2014-10-25 ENCOUNTER — Ambulatory Visit (HOSPITAL_COMMUNITY)
Admission: RE | Admit: 2014-10-25 | Discharge: 2014-10-25 | Disposition: A | Discharge status: Home / Self Care | Payer: 59 | Source: Ambulatory Visit | Attending: Interventional Radiology | Admitting: Interventional Radiology

## 2014-10-25 DIAGNOSIS — Z8489 Family history of other specified conditions: Secondary | ICD-10-CM | POA: Insufficient documentation

## 2014-10-25 DIAGNOSIS — I671 Cerebral aneurysm, nonruptured: Secondary | ICD-10-CM | POA: Insufficient documentation

## 2014-10-25 DIAGNOSIS — Z8249 Family history of ischemic heart disease and other diseases of the circulatory system: Secondary | ICD-10-CM | POA: Insufficient documentation

## 2014-10-25 DIAGNOSIS — Z79899 Other long term (current) drug therapy: Secondary | ICD-10-CM | POA: Insufficient documentation

## 2014-10-25 DIAGNOSIS — K219 Gastro-esophageal reflux disease without esophagitis: Secondary | ICD-10-CM | POA: Insufficient documentation

## 2014-10-25 DIAGNOSIS — I10 Essential (primary) hypertension: Secondary | ICD-10-CM | POA: Diagnosis not present

## 2014-10-25 DIAGNOSIS — I340 Nonrheumatic mitral (valve) insufficiency: Secondary | ICD-10-CM | POA: Diagnosis not present

## 2014-10-25 LAB — CBC
HEMATOCRIT: 36.9 % (ref 36.0–46.0)
Hemoglobin: 12.1 g/dL (ref 12.0–15.0)
MCH: 29.3 pg (ref 26.0–34.0)
MCHC: 32.8 g/dL (ref 30.0–36.0)
MCV: 89.3 fL (ref 78.0–100.0)
Platelets: 236 10*3/uL (ref 150–400)
RBC: 4.13 MIL/uL (ref 3.87–5.11)
RDW: 14.6 % (ref 11.5–15.5)
WBC: 5.9 10*3/uL (ref 4.0–10.5)

## 2014-10-25 LAB — BASIC METABOLIC PANEL
Anion gap: 7 (ref 5–15)
BUN: 9 mg/dL (ref 6–20)
CO2: 28 mmol/L (ref 22–32)
Calcium: 9 mg/dL (ref 8.9–10.3)
Chloride: 103 mmol/L (ref 101–111)
Creatinine, Ser: 0.85 mg/dL (ref 0.44–1.00)
GFR calc Af Amer: >60 mL/min (ref >60)
Glucose, Bld: 82 mg/dL (ref 65–99)
POTASSIUM: 3.7 mmol/L (ref 3.5–5.1)
Sodium: 138 mmol/L (ref 135–145)

## 2014-10-25 LAB — PROTIME-INR
INR: 1.04 (ref 0.00–1.49)
Prothrombin Time: 13.8 seconds (ref 11.6–15.2)

## 2014-10-25 LAB — APTT: aPTT: 28 seconds (ref 24–37)

## 2014-10-25 MED ORDER — HEPARIN SODIUM (PORCINE) 1000 UNIT/ML IJ SOLN
INTRAMUSCULAR | Status: AC | PRN
Start: 2014-10-25 — End: 2014-10-25
  Administered 2014-10-25: 1000 [IU] via INTRAVENOUS

## 2014-10-25 MED ORDER — LIDOCAINE HCL 1 % IJ SOLN
INTRAMUSCULAR | Status: AC
  Filled 2014-10-25: qty 20

## 2014-10-25 MED ORDER — MIDAZOLAM HCL 2 MG/2ML IJ SOLN
INTRAMUSCULAR | Status: AC
  Filled 2014-10-25: qty 2

## 2014-10-25 MED ORDER — IOHEXOL 300 MG/ML  SOLN
150.0000 mL | Freq: Once | INTRAMUSCULAR | Status: AC | PRN
  Administered 2014-10-25: 70 mL via INTRAVENOUS

## 2014-10-25 MED ORDER — SODIUM CHLORIDE 0.9 % IV SOLN
Freq: Once | INTRAVENOUS | Status: AC
  Administered 2014-10-25: 08:00:00 via INTRAVENOUS

## 2014-10-25 MED ORDER — MIDAZOLAM HCL 2 MG/2ML IJ SOLN
INTRAMUSCULAR | Status: AC | PRN
  Administered 2014-10-25: 1 mg via INTRAVENOUS

## 2014-10-25 MED ORDER — SODIUM CHLORIDE 0.9 % IV SOLN: INTRAVENOUS | Status: AC

## 2014-10-25 MED ORDER — FENTANYL CITRATE (PF) 100 MCG/2ML IJ SOLN
INTRAMUSCULAR | Status: AC | PRN
  Administered 2014-10-25: 25 ug via INTRAVENOUS

## 2014-10-25 MED ORDER — FENTANYL CITRATE (PF) 100 MCG/2ML IJ SOLN
INTRAMUSCULAR | Status: AC
  Filled 2014-10-25: qty 2

## 2014-10-25 NOTE — Sedation Documentation (Signed)
Pedal pulses, DP2+, PT 2+

## 2014-10-25 NOTE — Discharge Instructions (Signed)

## 2014-10-25 NOTE — Procedures (Signed)
S/P 4 vessel cerebral arteriogram. RT CFA approach. Findings. 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

## 2014-10-25 NOTE — Sedation Documentation (Signed)
5 [place] exoseal to R groin- IR tech holding pressure as well

## 2014-10-25 NOTE — H&P (Signed)
Chief Complaint: "I'm here for an angiogram" HPI: Sharon Mendoza is an 44 y.o. female with prior family hx of known cerebral aneurysms dating back to 2002. She has had prior cerebral arteriograms showing outpouching at the right PCOM, but subsequent MRI/MRA have not shown aneurysm. She is scheduled today for diagnostic cerebral arteriogram. PMHx, meds, labs reviewed. Feels well, recent low back pain, but no infectious issues. Recent urine pregnancy negative. Pt has been NPO today  Past Medical History:  Past Medical History  Diagnosis Date  . Hypertension   . Mitral valve regurgitation   . Gastroesophageal reflux   . Abnormal pap   . Chest pain radiating to arm     Past Surgical History:  Past Surgical History  Procedure Laterality Date  . Cesarean section  09/2005    Family History:  Family History  Problem Relation Age of Onset  . Hypertension Mother   . Liver disease Mother   . Cancer Father     colon  . Heart disease Brother   . Aneurysm Brother   . Heart attack Brother   . Breast cancer Brother   . Asthma Son   . Aneurysm Sister   . Aneurysm Brother   . Aneurysm Sister   . Heart attack Sister   . Aneurysm Sister     Social History:  reports that she has never smoked. She does not have any smokeless tobacco history on file. She reports that she does not drink alcohol or use illicit drugs.  Allergies: No Known Allergies  Medications:   Medication List    ASK your doctor about these medications        aspirin EC 81 MG tablet  Take 81-162 mg by mouth daily as needed for mild pain.     cyclobenzaprine 10 MG tablet  Commonly known as:  FLEXERIL  Take 1 tablet (10 mg total) by mouth at bedtime.     enalapril 5 MG tablet  Commonly known as:  VASOTEC  Take 1 tablet (5 mg total) by mouth daily.     HYDROcodone-acetaminophen 5-325 MG per tablet  Commonly known as:  NORCO/VICODIN  Take 1-2 tablets by mouth every 6 (six) hours as needed for moderate pain.     nitrofurantoin (macrocrystal-monohydrate) 100 MG capsule  Commonly known as:  MACROBID  Take 1 capsule (100 mg total) by mouth 2 (two) times daily.     Norethindrone-Ethinyl Estradiol-Fe Biphas 1 MG-10 MCG / 10 MCG tablet  Commonly known as:  LO LOESTRIN FE  Take 1 tablet by mouth daily.     pantoprazole 40 MG tablet  Commonly known as:  PROTONIX  Take 1 tablet (40 mg total) by mouth daily.     Vitamin D (Cholecalciferol) 1000 UNITS Tabs  Take 2,000 Units by mouth daily.        Please HPI for pertinent positives, otherwise complete 10 system ROS negative.  Physical Exam: BP 135/76 mmHg  Pulse 86  Temp(Src) 98.3 F (36.8 C)  Resp 18  Ht 5' 3" (1.6 m)  Wt 130 lb (58.968 kg)  BMI 23.03 kg/m2  SpO2 100% Body mass index is 23.03 kg/(m^2).   General Appearance:  Alert, cooperative, no distress, appears stated age  Head:  Normocephalic, without obvious abnormality, atraumatic  ENT: Unremarkable  Neck: Supple, symmetrical, trachea midline  Lungs:   Clear to auscultation bilaterally, no w/r/r, respirations unlabored without use of accessory muscles.  Heart:  Regular rate and rhythm, S1, S2 normal, no murmur, rub or  gallop.  Abdomen:   Soft, non-tender, non distended.  Extremities: Extremities normal, atraumatic, no cyanosis or edema  Pulses: 2+ and symmetric femoral  Neurologic: Normal affect, no gross deficits.  Labs: Results for orders placed or performed during the hospital encounter of 10/25/14 (from the past 48 hour(s))  APTT     Status: None   Collection Time: 10/25/14  8:41 AM  Result Value Ref Range   aPTT 28 24 - 37 seconds  Basic metabolic panel     Status: None   Collection Time: 10/25/14  8:41 AM  Result Value Ref Range   Sodium 138 135 - 145 mmol/L   Potassium 3.7 3.5 - 5.1 mmol/L   Chloride 103 101 - 111 mmol/L   CO2 28 22 - 32 mmol/L   Glucose, Bld 82 65 - 99 mg/dL   BUN 9 6 - 20 mg/dL   Creatinine, Ser 0.85 0.44 - 1.00 mg/dL   Calcium 9.0 8.9 - 10.3  mg/dL   GFR calc non Af Amer >60 >60 mL/min   GFR calc Af Amer >60 >60 mL/min    Comment: (NOTE) The eGFR has been calculated using the CKD EPI equation. This calculation has not been validated in all clinical situations. eGFR's persistently <60 mL/min signify possible Chronic Kidney Disease.    Anion gap 7 5 - 15  CBC     Status: None   Collection Time: 10/25/14  8:41 AM  Result Value Ref Range   WBC 5.9 4.0 - 10.5 K/uL   RBC 4.13 3.87 - 5.11 MIL/uL   Hemoglobin 12.1 12.0 - 15.0 g/dL   HCT 36.9 36.0 - 46.0 %   MCV 89.3 78.0 - 100.0 fL   MCH 29.3 26.0 - 34.0 pg   MCHC 32.8 30.0 - 36.0 g/dL   RDW 14.6 11.5 - 15.5 %   Platelets 236 150 - 400 K/uL  Protime-INR     Status: None   Collection Time: 10/25/14  8:41 AM  Result Value Ref Range   Prothrombin Time 13.8 11.6 - 15.2 seconds   INR 1.04 0.00 - 1.49    Imaging: No results found.  Assessment/Plan Family and personal history of cerebral aneurysms. Plan for cerebral arteriogram today. Labs reviewed, ok Procedure, risks, complications, use of sedation reviewed. Consent signed in chart  Ascencion Dike PA-C 10/25/2014, 9:36 AM

## 2014-11-04 ENCOUNTER — Other Ambulatory Visit (HOSPITAL_COMMUNITY): Payer: Self-pay | Admitting: Interventional Radiology

## 2014-11-04 DIAGNOSIS — I671 Cerebral aneurysm, nonruptured: Secondary | ICD-10-CM

## 2014-11-06 NOTE — Progress Notes (Signed)
Patient ID: Sharon Mendoza, female   DOB: [birth year], 44 y.o.   MRN: [id number]   Called Plavix 75 mg #30  To Octavia Bruckner Teeter 1040 am 11/06/14 1 po daily  Pt aware To start 11/14/14

## 2014-11-13 ENCOUNTER — Other Ambulatory Visit: Payer: Self-pay | Admitting: Radiology

## 2014-11-15 ENCOUNTER — Encounter (HOSPITAL_COMMUNITY): Payer: Self-pay

## 2014-11-15 ENCOUNTER — Encounter (HOSPITAL_COMMUNITY)
Admission: RE | Admit: 2014-11-15 | Discharge: 2014-11-15 | Disposition: A | Discharge status: Home / Self Care | Payer: 59 | Source: Ambulatory Visit | Attending: Interventional Radiology | Admitting: Interventional Radiology

## 2014-11-15 DIAGNOSIS — Z01818 Encounter for other preprocedural examination: Secondary | ICD-10-CM | POA: Diagnosis present

## 2014-11-15 DIAGNOSIS — Z01810 Encounter for preprocedural cardiovascular examination: Secondary | ICD-10-CM | POA: Diagnosis not present

## 2014-11-15 DIAGNOSIS — Z79899 Other long term (current) drug therapy: Secondary | ICD-10-CM | POA: Insufficient documentation

## 2014-11-15 DIAGNOSIS — K219 Gastro-esophageal reflux disease without esophagitis: Secondary | ICD-10-CM | POA: Diagnosis not present

## 2014-11-15 DIAGNOSIS — Z01812 Encounter for preprocedural laboratory examination: Secondary | ICD-10-CM | POA: Insufficient documentation

## 2014-11-15 DIAGNOSIS — I671 Cerebral aneurysm, nonruptured: Secondary | ICD-10-CM | POA: Diagnosis not present

## 2014-11-15 DIAGNOSIS — I10 Essential (primary) hypertension: Secondary | ICD-10-CM | POA: Insufficient documentation

## 2014-11-15 HISTORY — DX: Other specified postprocedural states: R11.2

## 2014-11-15 HISTORY — DX: Other specified postprocedural states: Z98.890

## 2014-11-15 LAB — CBC WITH DIFFERENTIAL/PLATELET
Basophils Absolute: 0 10*3/uL (ref 0.0–0.1)
Basophils Relative: 1 % (ref 0–1)
Eosinophils Absolute: 0.2 10*3/uL (ref 0.0–0.7)
Eosinophils Relative: 4 % (ref 0–5)
HCT: 39.3 % (ref 36.0–46.0)
Hemoglobin: 13 g/dL (ref 12.0–15.0)
LYMPHS PCT: 52 % — AB (ref 12–46)
Lymphs Abs: 2.4 10*3/uL (ref 0.7–4.0)
MCH: 29.7 pg (ref 26.0–34.0)
MCHC: 33.1 g/dL (ref 30.0–36.0)
MCV: 89.9 fL (ref 78.0–100.0)
Monocytes Absolute: 0.4 10*3/uL (ref 0.1–1.0)
Monocytes Relative: 9 % (ref 3–12)
NEUTROS ABS: 1.6 10*3/uL — AB (ref 1.7–7.7)
Neutrophils Relative %: 34 % — ABNORMAL LOW (ref 43–77)
PLATELETS: 323 10*3/uL (ref 150–400)
RBC: 4.37 MIL/uL (ref 3.87–5.11)
RDW: 14.3 % (ref 11.5–15.5)
WBC: 4.7 10*3/uL (ref 4.0–10.5)

## 2014-11-15 LAB — BASIC METABOLIC PANEL
ANION GAP: 6 (ref 5–15)
BUN: 12 mg/dL (ref 6–20)
CO2: 28 mmol/L (ref 22–32)
Calcium: 9.3 mg/dL (ref 8.9–10.3)
Chloride: 103 mmol/L (ref 101–111)
Creatinine, Ser: 0.87 mg/dL (ref 0.44–1.00)
GFR calc Af Amer: >60 mL/min (ref >60)
Glucose, Bld: 82 mg/dL (ref 65–99)
POTASSIUM: 4.8 mmol/L (ref 3.5–5.1)
SODIUM: 137 mmol/L (ref 135–145)

## 2014-11-15 LAB — HCG, SERUM, QUALITATIVE: Preg, Serum: NEGATIVE

## 2014-11-15 LAB — APTT: APTT: 28 s (ref 24–37)

## 2014-11-15 LAB — PROTIME-INR
INR: 0.93 (ref 0.00–1.49)
Prothrombin Time: 12.7 seconds (ref 11.6–15.2)

## 2014-11-15 NOTE — Progress Notes (Signed)
PCP is [facility] [employer]. Patient informed Nurse that she had a cardiac workup in 2011 or 2012 at [facility] with Dr. Neoma Laming because she had a "leaky valve." Will request records.  Patient denied having any acute cardiac or pulmonary issues  Patient informed Nurse that she started taking Plavix and aspirin yesterday, therefore P2y12 lab will be drawn DOS.

## 2014-11-15 NOTE — Pre-Procedure Instructions (Signed)
Keyla Milone  11/15/2014     Your procedure is scheduled on : Thursday November 21, 2014 at 11:30 AM.  Report to [facility] Admitting at 9:30 A.M.  Call this number if you have problems the morning of surgery: [phone]   Remember:  Do not eat food or drink liquids after midnight.  Take these medicines the morning of surgery with A SIP OF WATER : Aspirin, Plavix, Birth Control pill, Pantoprazole (Protonix), and Zytrec   Stop taking any vitamins, herbal medications, Ibuprofen, Advil, Motrin, Aleve, etc   Do not wear jewelry, make-up or nail polish.  Do not wear lotions, powders, or perfumes.   Do not shave 48 hours prior to surgery.    Do not bring valuables to the hospital.  [facility] is not responsible for any belongings or valuables.  Contacts, dentures or bridgework may not be worn into surgery.  Leave your suitcase in the car.  After surgery it may be brought to your room.  For patients admitted to the hospital, discharge time will be determined by your treatment team.  Patients discharged the day of surgery will not be allowed to drive home.   Name and phone number of your driver:    Special instructions:  Shower using CHG soap the night before and the morning of your surgery  Please read over the following fact sheets that you were given. Pain Booklet, Coughing and Deep Breathing and Surgical Site Infection Prevention

## 2014-11-18 ENCOUNTER — Encounter (HOSPITAL_COMMUNITY): Payer: Self-pay

## 2014-11-18 NOTE — Progress Notes (Signed)
Anesthesia Chart Review: Patient is a 44 year old female posted for embolization of aneurysm on 11/21/14 by Dr. Estanislado Pandy. Consent is not yet completed. 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    History includes non-smoker, post-operative N/V, HTN, mitral regurgitation, GERD. Cardiology evaluation for chest pain and "leaky valve" in the past with Dr. Neoma Laming with [facility]). Normal coronaries by cardiac CTA in 2014. Mild MR/TR by 2013 echo. PCP is listed as Dr. Salvadore Dom.      10/25/14 Cerebral angiogram: 1. Approx 39mm x 78mm irregular PCom aneurysm. 2.Approx 2.45mmx 1.6 mm Rt Ant Choroidal irregular aneurysm  Meds include Zyrtec, enalapril, Lo Loestrin Fe, Protonix, Vitamin D. She started ASA and Plavix on 11/14/14.   11/15/14 EKG: NSR  06/23/12 Cardiac CTA ([facility]): Calcium score is 0. Right dominant system. Normal coronaries.  05/18/11 Echo ([facility]): Normal chamber size. Normal LV systolic function, EF 67%. Normal wall motion. Mild LHV. Mild TR. Normal pulmonary artery pressure. No pericardial effusion.   05/19/11 Nuclear stress test ([facility]): Normal study, EF 87%. No perfusion defects.  02/28/09 Stress echo ([facility]): Study Conclusions - Stress ECG conclusions: The stress ECG was normal. - Staged echo: Normal echo stress  Preoperative labs noted. Serum pregnancy test was negative. She is for p2y12 on the day of surgery.  If p2y12 results are felt acceptable and otherwise no acute changes then I anticipate that she can proceed as planned.  George Hugh [facility] Stay [facility]/Anesthesiology Phone [phone] 11/18/2014 12:00 PM

## 2014-11-20 ENCOUNTER — Other Ambulatory Visit: Payer: Self-pay | Admitting: Physician Assistant

## 2014-11-20 NOTE — Anesthesia Preprocedure Evaluation (Addendum)
Anesthesia Evaluation  Patient identified by MRN, date of birth, ID band Patient awake    Reviewed: Allergy & Precautions, NPO status , Patient's Chart, lab work & pertinent test results  History of Anesthesia Complications (+) PONV and AWARENESS UNDER ANESTHESIA  Airway Mallampati: II   Neck ROM: Full    Dental  (+) Dental Advisory Given, Teeth Intact   Pulmonary  breath sounds clear to auscultation        Cardiovascular hypertension, Pt. on medications Rhythm:Regular  ECHO, EKG, coronary calcium score all normal, EF 70%   Neuro/Psych aneurysm x 2    GI/Hepatic GERD-  Medicated,  Endo/Other    Renal/GU      Musculoskeletal   Abdominal (+)  Abdomen: soft.    Peds  Hematology 13/39   Anesthesia Other Findings 10/25/14 Cerebral angiogram: 1. Approx 67mm x 53mm irregular PCom aneurysm. 2.Approx 2.27mmx 1.6 mm Rt Ant Choroidal irregular aneurysm   Reproductive/Obstetrics PG test neg                        Anesthesia Physical Anesthesia Plan  ASA: II  Anesthesia Plan: General   Post-op Pain Management:    Induction: Intravenous  Airway Management Planned: Oral ETT  Additional Equipment:   Intra-op Plan:   Post-operative Plan: Extubation in OR  Informed Consent: I have reviewed the patients History and Physical, chart, labs and discussed the procedure including the risks, benefits and alternatives for the proposed anesthesia with the patient or authorized representative who has indicated his/her understanding and acceptance.     Plan Discussed with:   Anesthesia Plan Comments:         Anesthesia Quick Evaluation

## 2014-11-21 ENCOUNTER — Ambulatory Visit (HOSPITAL_COMMUNITY)
Admission: RE | Admit: 2014-11-21 | Discharge: 2014-11-21 | Disposition: A | Discharge status: Home / Self Care | Payer: 59 | Source: Ambulatory Visit | Attending: Interventional Radiology | Admitting: Interventional Radiology

## 2014-11-21 ENCOUNTER — Inpatient Hospital Stay (HOSPITAL_COMMUNITY): Payer: 59 | Admitting: Anesthesiology

## 2014-11-21 ENCOUNTER — Encounter (HOSPITAL_COMMUNITY): Payer: Self-pay | Admitting: *Deleted

## 2014-11-21 ENCOUNTER — Inpatient Hospital Stay (HOSPITAL_COMMUNITY)
Admission: AD | Admit: 2014-11-21 | Discharge: 2014-11-22 | DRG: 027 | Disposition: A | Discharge status: Home / Self Care | Payer: 59 | Source: Ambulatory Visit | Attending: Interventional Radiology | Admitting: Interventional Radiology

## 2014-11-21 ENCOUNTER — Inpatient Hospital Stay (HOSPITAL_COMMUNITY): Payer: 59 | Admitting: Vascular Surgery

## 2014-11-21 ENCOUNTER — Encounter (HOSPITAL_COMMUNITY)
Admission: AD | Disposition: A | Discharge status: Home / Self Care | Payer: Self-pay | Source: Ambulatory Visit | Attending: Interventional Radiology

## 2014-11-21 DIAGNOSIS — Z7902 Long term (current) use of antithrombotics/antiplatelets: Secondary | ICD-10-CM | POA: Insufficient documentation

## 2014-11-21 DIAGNOSIS — Z7982 Long term (current) use of aspirin: Secondary | ICD-10-CM

## 2014-11-21 DIAGNOSIS — I671 Cerebral aneurysm, nonruptured: Principal | ICD-10-CM | POA: Diagnosis present

## 2014-11-21 DIAGNOSIS — Z79899 Other long term (current) drug therapy: Secondary | ICD-10-CM | POA: Diagnosis not present

## 2014-11-21 DIAGNOSIS — Z823 Family history of stroke: Secondary | ICD-10-CM | POA: Insufficient documentation

## 2014-11-21 HISTORY — PX: RADIOLOGY WITH ANESTHESIA: SHX6223

## 2014-11-21 LAB — POCT ACTIVATED CLOTTING TIME
Activated Clotting Time: 171 seconds
Activated Clotting Time: 184 seconds

## 2014-11-21 LAB — APTT: APTT: 34 s (ref 24–37)

## 2014-11-21 LAB — MRSA PCR SCREENING: MRSA by PCR: NEGATIVE

## 2014-11-21 LAB — PLATELET INHIBITION P2Y12: Platelet Function  P2Y12: 160 [PRU] — ABNORMAL LOW (ref 194–418)

## 2014-11-21 LAB — HEPARIN LEVEL (UNFRACTIONATED): HEPARIN UNFRACTIONATED: 0.11 [IU]/mL — AB (ref 0.30–0.70)

## 2014-11-21 SURGERY — RADIOLOGY WITH ANESTHESIA
Anesthesia: General

## 2014-11-21 MED ORDER — ASPIRIN 325 MG PO TABS
325.0000 mg | ORAL_TABLET | Freq: Every day | ORAL | Status: DC
  Administered 2014-11-22: 325 mg via ORAL
  Filled 2014-11-21 (×2): qty 1

## 2014-11-21 MED ORDER — ONDANSETRON HCL 4 MG/2ML IJ SOLN
INTRAMUSCULAR | Status: DC | PRN
  Administered 2014-11-21: 4 mg via INTRAVENOUS

## 2014-11-21 MED ORDER — HEPARIN (PORCINE) IN NACL 100-0.45 UNIT/ML-% IJ SOLN
500.0000 [IU]/h | INTRAMUSCULAR | Status: DC
  Administered 2014-11-21: 500 [IU]/h via INTRAVENOUS

## 2014-11-21 MED ORDER — NORETHIN-ETH ESTRAD-FE BIPHAS 1 MG-10 MCG / 10 MCG PO TABS: 1.0000 | ORAL_TABLET | Freq: Every day | ORAL | Status: DC

## 2014-11-21 MED ORDER — CLOPIDOGREL BISULFATE 75 MG PO TABS: 75.0000 mg | ORAL_TABLET | Freq: Every day | ORAL | Status: DC

## 2014-11-21 MED ORDER — LIDOCAINE HCL (CARDIAC) 20 MG/ML IV SOLN
INTRAVENOUS | Status: DC | PRN
  Administered 2014-11-21: 100 mg via INTRAVENOUS

## 2014-11-21 MED ORDER — MEPERIDINE HCL 25 MG/ML IJ SOLN: 6.2500 mg | INTRAMUSCULAR | Status: DC | PRN

## 2014-11-21 MED ORDER — NIMODIPINE 30 MG PO CAPS
0.0000 mg | ORAL_CAPSULE | ORAL | Status: AC
  Administered 2014-11-21: 30 mg via ORAL
  Filled 2014-11-21: qty 2

## 2014-11-21 MED ORDER — ASPIRIN 325 MG PO TABS: 325.0000 mg | ORAL_TABLET | Freq: Every day | ORAL | Status: DC

## 2014-11-21 MED ORDER — PROMETHAZINE HCL 25 MG/ML IJ SOLN
INTRAMUSCULAR | Status: AC
  Filled 2014-11-21: qty 1

## 2014-11-21 MED ORDER — CEFAZOLIN SODIUM-DEXTROSE 2-3 GM-% IV SOLR
INTRAVENOUS | Status: AC
  Administered 2014-11-21: 2 g via INTRAVENOUS
  Filled 2014-11-21: qty 50

## 2014-11-21 MED ORDER — FENTANYL CITRATE (PF) 100 MCG/2ML IJ SOLN
INTRAMUSCULAR | Status: DC | PRN
  Administered 2014-11-21: 50 ug via INTRAVENOUS
  Administered 2014-11-21: 150 ug via INTRAVENOUS
  Administered 2014-11-21: 50 ug via INTRAVENOUS

## 2014-11-21 MED ORDER — HEPARIN (PORCINE) IN NACL 100-0.45 UNIT/ML-% IJ SOLN
500.0000 [IU]/h | INTRAMUSCULAR | Status: AC
  Administered 2014-11-21: 500 [IU]/h via INTRAVENOUS
  Filled 2014-11-21: qty 250

## 2014-11-21 MED ORDER — ESMOLOL HCL 10 MG/ML IV SOLN
INTRAVENOUS | Status: DC | PRN
  Administered 2014-11-21: 10 mg via INTRAVENOUS

## 2014-11-21 MED ORDER — HEPARIN (PORCINE) IN NACL 100-0.45 UNIT/ML-% IJ SOLN
INTRAMUSCULAR | Status: AC
  Filled 2014-11-21: qty 250

## 2014-11-21 MED ORDER — ACETAMINOPHEN 500 MG PO TABS
1000.0000 mg | ORAL_TABLET | Freq: Four times a day (QID) | ORAL | Status: DC | PRN
  Administered 2014-11-21: 1000 mg via ORAL
  Filled 2014-11-21: qty 2

## 2014-11-21 MED ORDER — LACTATED RINGERS IV SOLN
INTRAVENOUS | Status: DC
  Administered 2014-11-21 (×2): via INTRAVENOUS

## 2014-11-21 MED ORDER — CLOPIDOGREL BISULFATE 75 MG PO TABS
75.0000 mg | ORAL_TABLET | ORAL | Status: AC
  Administered 2014-11-21: 75 mg via ORAL
  Filled 2014-11-21: qty 1

## 2014-11-21 MED ORDER — NEOSTIGMINE METHYLSULFATE 10 MG/10ML IV SOLN
INTRAVENOUS | Status: DC | PRN
  Administered 2014-11-21: 4 mg via INTRAVENOUS

## 2014-11-21 MED ORDER — PANTOPRAZOLE SODIUM 40 MG PO TBEC
40.0000 mg | DELAYED_RELEASE_TABLET | Freq: Every day | ORAL | Status: DC
  Administered 2014-11-21: 40 mg via ORAL
  Filled 2014-11-21: qty 1

## 2014-11-21 MED ORDER — ONDANSETRON HCL 4 MG/2ML IJ SOLN: 4.0000 mg | Freq: Four times a day (QID) | INTRAMUSCULAR | Status: DC | PRN

## 2014-11-21 MED ORDER — SCOPOLAMINE 1 MG/3DAYS TD PT72
1.0000 | MEDICATED_PATCH | TRANSDERMAL | Status: DC
  Administered 2014-11-21: 1.5 mg via TRANSDERMAL

## 2014-11-21 MED ORDER — NIMODIPINE 30 MG PO CAPS
ORAL_CAPSULE | ORAL | Status: AC
  Filled 2014-11-21: qty 1

## 2014-11-21 MED ORDER — ASPIRIN EC 325 MG PO TBEC
325.0000 mg | DELAYED_RELEASE_TABLET | ORAL | Status: DC
  Filled 2014-11-21: qty 1

## 2014-11-21 MED ORDER — SODIUM CHLORIDE 0.9 % IV SOLN: INTRAVENOUS | Status: DC

## 2014-11-21 MED ORDER — PROMETHAZINE HCL 25 MG/ML IJ SOLN
6.2500 mg | INTRAMUSCULAR | Status: DC | PRN
  Administered 2014-11-21: 6.25 mg via INTRAVENOUS

## 2014-11-21 MED ORDER — SCOPOLAMINE 1 MG/3DAYS TD PT72
MEDICATED_PATCH | TRANSDERMAL | Status: AC
  Filled 2014-11-21: qty 1

## 2014-11-21 MED ORDER — PROPOFOL 10 MG/ML IV BOLUS
INTRAVENOUS | Status: DC | PRN
  Administered 2014-11-21: 200 mg via INTRAVENOUS

## 2014-11-21 MED ORDER — GLYCOPYRROLATE 0.2 MG/ML IJ SOLN
INTRAMUSCULAR | Status: DC | PRN
  Administered 2014-11-21: 0.6 mg via INTRAVENOUS

## 2014-11-21 MED ORDER — ROCURONIUM BROMIDE 100 MG/10ML IV SOLN
INTRAVENOUS | Status: DC | PRN
  Administered 2014-11-21: 40 mg via INTRAVENOUS

## 2014-11-21 MED ORDER — FENTANYL CITRATE (PF) 100 MCG/2ML IJ SOLN
25.0000 ug | INTRAMUSCULAR | Status: DC | PRN
  Administered 2014-11-21: 25 ug via INTRAVENOUS

## 2014-11-21 MED ORDER — CLOPIDOGREL BISULFATE 75 MG PO TABS
75.0000 mg | ORAL_TABLET | Freq: Every day | ORAL | Status: DC
  Administered 2014-11-22: 75 mg via ORAL
  Filled 2014-11-21 (×2): qty 1

## 2014-11-21 MED ORDER — PANTOPRAZOLE SODIUM 40 MG PO TBEC: 40.0000 mg | DELAYED_RELEASE_TABLET | Freq: Every day | ORAL | Status: DC

## 2014-11-21 MED ORDER — NITROGLYCERIN 1 MG/10 ML FOR IR/CATH LAB
INTRA_ARTERIAL | Status: DC
Start: 2014-11-21 — End: 2014-11-22
  Filled 2014-11-21: qty 10

## 2014-11-21 MED ORDER — CLOPIDOGREL BISULFATE 75 MG PO TABS
75.0000 mg | ORAL_TABLET | ORAL | Status: DC
  Filled 2014-11-21: qty 1

## 2014-11-21 MED ORDER — ACETAMINOPHEN 650 MG RE SUPP: 650.0000 mg | Freq: Four times a day (QID) | RECTAL | Status: DC | PRN

## 2014-11-21 MED ORDER — NICARDIPINE HCL IN NACL 20-0.86 MG/200ML-% IV SOLN
5.0000 mg/h | INTRAVENOUS | Status: DC
  Administered 2014-11-21: 5 mg/h via INTRAVENOUS
  Filled 2014-11-21: qty 200

## 2014-11-21 MED ORDER — CEFAZOLIN SODIUM-DEXTROSE 2-3 GM-% IV SOLR: 2.0000 g | Freq: Once | INTRAVENOUS | Status: DC

## 2014-11-21 MED ORDER — HEPARIN SODIUM (PORCINE) 1000 UNIT/ML IJ SOLN
INTRAMUSCULAR | Status: DC | PRN
  Administered 2014-11-21: 3000 [IU] via INTRAVENOUS
  Administered 2014-11-21 (×2): 500 [IU] via INTRAVENOUS

## 2014-11-21 MED ORDER — IOHEXOL 300 MG/ML  SOLN
150.0000 mL | Freq: Once | INTRAMUSCULAR | Status: DC | PRN
  Administered 2014-11-21: 70 mL via INTRAVENOUS
  Filled 2014-11-21: qty 150

## 2014-11-21 MED ORDER — FENTANYL CITRATE (PF) 100 MCG/2ML IJ SOLN
INTRAMUSCULAR | Status: AC
  Filled 2014-11-21: qty 2

## 2014-11-21 MED ORDER — PHENYLEPHRINE HCL 10 MG/ML IJ SOLN
10.0000 mg | INTRAVENOUS | Status: DC | PRN
  Administered 2014-11-21: 10 ug/min via INTRAVENOUS

## 2014-11-21 NOTE — H&P (Signed)
HPI:  The patient has had a H&P performed within the last 30 days, all history, medications, and exam have been reviewed. The patient denies any interval changes since the H&P.  Sharon Mendoza is s/p cerebral angio by Dr. Estanislado Pandy on 10/25/2014. This revealed Approximately 3 mm x 2 mm 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 Approximately 2.3 mm x 1.5 mm irregular right anterior choroidal artery region aneurysm. Both these aneurysms have increased in size compared to previous arteriogram, and also demonstrate a morphological change.  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   Medications: Prior to Admission medications   Medication Sig Start Date End Date Taking? Authorizing Provider  aspirin 325 MG tablet Take 325 mg by mouth daily.   Yes Historical Provider, MD  cetirizine (ZYRTEC) 10 MG tablet Take 10 mg by mouth daily.   Yes Historical Provider, MD  clopidogrel (PLAVIX) 75 MG tablet Take 75 mg by mouth daily.   Yes Historical Provider, MD  enalapril (VASOTEC) 5 MG tablet Take 1 tablet (5 mg total) by mouth daily. 11/28/13  Yes Elveria Rising, MD  Norethindrone-Ethinyl Estradiol-Fe Biphas (LO LOESTRIN FE) 1 MG-10 MCG / 10 MCG tablet Take 1 tablet by mouth daily. 11/28/13  Yes Elveria Rising, MD  pantoprazole (PROTONIX) 40 MG tablet Take 1 tablet (40 mg total) by mouth daily. 11/28/13  Yes Elveria Rising, MD  Vitamin D, Cholecalciferol, 1000 UNITS TABS Take 2,000 Units by mouth daily.   Yes Historical Provider, MD     Vital Signs: Temp 98.7 BP 135/79 Pulse 83 Resp 18  Physical Exam  Constitutional: She is oriented to person, place, and time. She appears well-developed and well-nourished.  HENT:  Head: Normocephalic and  atraumatic.  Eyes: EOM are normal.  Neck: Normal range of motion. Neck supple.  Cardiovascular: Normal rate, regular rhythm and normal heart sounds.   Pulmonary/Chest: Effort normal and breath sounds normal.  Abdominal: Soft. Bowel sounds are normal.  Musculoskeletal: Normal range of motion.  Neurological: She is alert and oriented to person, place, and time. No cranial nerve deficit.  Skin: Skin is warm and dry.  Psychiatric: She has a normal mood and affect. Her behavior is normal. Judgment and thought content normal.  Vitals reviewed.    Mallampati Score:  MD Evaluation Airway: WNL Heart: WNL Abdomen: WNL Chest/ Lungs: WNL ASA  Classification: 2 Mallampati/Airway Score: One  Labs:  CBC:  Recent Labs  10/25/14 0841 11/15/14 0819  WBC 5.9 4.7  HGB 12.1 13.0  HCT 36.9 39.3  PLT 236 323    COAGS:  Recent Labs  10/25/14 0841 11/15/14 0819  INR 1.04 0.93  APTT 28 28    BMP:  Recent Labs  11/28/13 0852 10/25/14 0841 11/15/14 0819  NA 137 138 137  K 4.4 3.7 4.8  CL 105 103 103  CO2 26 28 28   GLUCOSE 74 82 82  BUN 13 9 12   CALCIUM 9.2 9.0 9.3  CREATININE 0.73 0.85 0.87  GFRNONAA  --  >60 >60  GFRAA  --  >60 >60    LIVER FUNCTION TESTS:  Recent Labs  11/28/13 0852  BILITOT 0.3  AST 10  ALT <8  ALKPHOS 45  PROT 6.2  ALBUMIN 3.7    Assessment/Plan:   A 3 mm x 2  mm 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 Approximately 2.3 mm x 1.5 mm irregular right anterior choroidal artery region aneurysm.   Both these aneurysms have increased in size compared to previous arteriogram, and also demonstrate a morphological change.  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   Risks and  Benefits discussed with the patient including, but not limited to bleeding, infection, vascular injury, contrast induced renal failure, stroke or even death.  All of the patient's questions were answered, patient is agreeable to proceed. Consent signed and in chart.  Signed: Murrell Redden PA-C 11/21/2014, 10:10 AM

## 2014-11-21 NOTE — Procedures (Signed)
S/P RT common carotid arteriogram,followed by placement  Of a single pipeline flow diverter across Rt ICA x 2 intracranial aneurysms

## 2014-11-21 NOTE — Progress Notes (Addendum)
ANTICOAGULATION CONSULT NOTE - Initial Consult  Pharmacy Consult for Heparin  Indication: s/p embolization  No Known Allergies  Patient Measurements: Height: 5\' 3"  (160 cm) Weight: 130 lb (58.968 kg) IBW/kg (Calculated) : 52.4  Vital Signs: Temp: 98.5 F (36.9 C) (09/01 1559) Temp Source: Oral (09/01 0940) BP: 135/79 mmHg (09/01 0952) Pulse Rate: 83 (09/01 0940)  Labs: No results for input(s): HGB, HCT, PLT, APTT, LABPROT, INR, HEPARINUNFRC, CREATININE, CKTOTAL, CKMB, TROPONINI in the last 72 hours.  Estimated Creatinine Clearance: 67.5 mL/min (by C-G formula based on Cr of 0.87).   Medical History: Past Medical History  Diagnosis Date  . Hypertension   . Mitral valve regurgitation   . Gastroesophageal reflux   . Abnormal pap   . Chest pain radiating to arm   . PONV (postoperative nausea and vomiting)    Assessment: 44 yo F presents on 9/1 s/p cerebral angio on 8/5. This revealed a R posterior communicating artery region aneurysm which have both increased in size from previous angiogram. Patient came in today for repair of brain aneurysm. Now pharmacy consulted to dose post embolization heparin with a goal aPTT target of 50-60 seconds (HL = 0.1-0.25 units/mL). CBC is stable, no s/s of bleed.  Goal of Therapy:  Heparin level 0.1-0.25 units/mL Monitor platelets by anticoagulation protocol: Yes   Plan:  Start heparin gtt at 500 units/hr Check 6 hr HL Monitor CBC, s/s of bleed  Will stop heparin gtt at 0700 tomorrow morning   Elenor Quinones J 11/21/2014,4:22 PM  ADDENDUM:  Heparin level came back therapeutic at 0.11. Heparin was started at 1600 and lab drawn at 2130. Would expect heparin level to increase slightly as it was not quite at steady state. Will not check another level in the am as heparin gtt has stop time in for 0700 tomorrow

## 2014-11-21 NOTE — Transfer of Care (Signed)
Immediate Anesthesia Transfer of Care Note  Patient: Sharon Mendoza  Procedure(s) Performed: Procedure(s): Embolization (N/A)  Patient Location: PACU  Anesthesia Type:General  Level of Consciousness: awake, alert  and oriented  Airway & Oxygen Therapy: Patient connected to face mask oxygen  Post-op Assessment: Report given to RN  Post vital signs: stable  Last Vitals:  Filed Vitals:   11/21/14 0952  BP: 135/79  Pulse:   Temp:   Resp:     Complications: No apparent anesthesia complications

## 2014-11-21 NOTE — Progress Notes (Signed)
  Subjective: Extubated, in PACU. She denies any headache. She c/o nausea and abdominal bloating.   Allergies: Review of patient's allergies indicates no known allergies.  Medications: Prior to Admission medications   Medication Sig Start Date End Date Taking? Authorizing Provider  aspirin 325 MG tablet Take 325 mg by mouth daily.   Yes Historical Provider, MD  cetirizine (ZYRTEC) 10 MG tablet Take 10 mg by mouth daily.   Yes Historical Provider, MD  clopidogrel (PLAVIX) 75 MG tablet Take 75 mg by mouth daily.   Yes Historical Provider, MD  enalapril (VASOTEC) 5 MG tablet Take 1 tablet (5 mg total) by mouth daily. 11/28/13  Yes Elveria Rising, MD  Norethindrone-Ethinyl Estradiol-Fe Biphas (LO LOESTRIN FE) 1 MG-10 MCG / 10 MCG tablet Take 1 tablet by mouth daily. 11/28/13  Yes Elveria Rising, MD  pantoprazole (PROTONIX) 40 MG tablet Take 1 tablet (40 mg total) by mouth daily. 11/28/13  Yes Elveria Rising, MD  Vitamin D, Cholecalciferol, 1000 UNITS TABS Take 2,000 Units by mouth daily.   Yes Historical Provider, MD   Vital Signs: VSS  Physical Exam General: Extubated, A&Ox3, NAD Abd: Soft Ext: RCFA dressing C/D/I, soft, NT, no signs of hematoma/bleeding, DP 1+ b/l Neuro: Speech clear, answers appropriately, equal strength upper and lower extremities, no ataxia  Imaging: No results found.  Labs:  CBC:  Recent Labs  10/25/14 0841 11/15/14 0819  WBC 5.9 4.7  HGB 12.1 13.0  HCT 36.9 39.3  PLT 236 323    COAGS:  Recent Labs  10/25/14 0841 11/15/14 0819  INR 1.04 0.93  APTT 28 28    BMP:  Recent Labs  11/28/13 0852 10/25/14 0841 11/15/14 0819  NA 137 138 137  K 4.4 3.7 4.8  CL 105 103 103  CO2 26 28 28   GLUCOSE 74 82 82  BUN 13 9 12   CALCIUM 9.2 9.0 9.3  CREATININE 0.73 0.85 0.87  GFRNONAA  --  >60 >60  GFRAA  --  >60 >60    LIVER FUNCTION TESTS:  Recent Labs  11/28/13 0852  BILITOT 0.3  AST 10  ALT <8  ALKPHOS 45  PROT 6.2  ALBUMIN 3.7     Assessment and Plan: Right ICA x 2 intracranial aneurysms increasing in size and morphologically changing  S/p successful right common carotid and arteriogram and placement of pipeline flow diverter  Admit to neuro ICU overnight with close monitoring Advance to ice chips and liquid diet as tolerated, continue IV heparin, ASA 325 mg and Plavix 75 mg, BP guidelines ordered  Possible D/C in am if stable    Signed: Tsosie Billing D 11/21/2014, 4:14 PM

## 2014-11-21 NOTE — Progress Notes (Signed)
Patients groin site oozing through dressing. Held manual pressure for 18minutes and placed 10lb sandbag at site. Dressing reinforced to monitor for continued oozing. Site still is soft with no evidence of hematoma formation. Dr Estanislado Pandy notified, stat aptt drawn at 2130 and resulted as 34. Will continue to monitor.

## 2014-11-21 NOTE — Anesthesia Procedure Notes (Signed)
Procedure Name: Intubation Date/Time: 11/21/2014 1:14 PM Performed by: Lavell Luster Pre-anesthesia Checklist: Patient identified, Emergency Drugs available, Suction available, Patient being monitored and Timeout performed Patient Re-evaluated:Patient Re-evaluated prior to inductionOxygen Delivery Method: Circle system utilized Preoxygenation: Pre-oxygenation with 100% oxygen Intubation Type: IV induction Ventilation: Mask ventilation without difficulty Laryngoscope Size: Mac and 4 Grade View: Grade I Tube type: Oral Tube size: 7.0 mm Number of attempts: 1 Airway Equipment and Method: Stylet Placement Confirmation: ETT inserted through vocal cords under direct vision,  positive ETCO2 and breath sounds checked- equal and bilateral Secured at: 22 cm Tube secured with: Tape Dental Injury: Teeth and Oropharynx as per pre-operative assessment

## 2014-11-22 ENCOUNTER — Encounter (HOSPITAL_COMMUNITY): Payer: Self-pay | Admitting: Interventional Radiology

## 2014-11-22 LAB — BASIC METABOLIC PANEL
Anion gap: 4 — ABNORMAL LOW (ref 5–15)
CHLORIDE: 109 mmol/L (ref 101–111)
CO2: 25 mmol/L (ref 22–32)
CREATININE: 0.75 mg/dL (ref 0.44–1.00)
Calcium: 8.2 mg/dL — ABNORMAL LOW (ref 8.9–10.3)
Glucose, Bld: 127 mg/dL — ABNORMAL HIGH (ref 65–99)
POTASSIUM: 3.4 mmol/L — AB (ref 3.5–5.1)
SODIUM: 138 mmol/L (ref 135–145)

## 2014-11-22 LAB — CBC WITH DIFFERENTIAL/PLATELET
BASOS ABS: 0 10*3/uL (ref 0.0–0.1)
Basophils Relative: 0 % (ref 0–1)
EOS ABS: 0 10*3/uL (ref 0.0–0.7)
EOS PCT: 0 % (ref 0–5)
HCT: 33.8 % — ABNORMAL LOW (ref 36.0–46.0)
HEMOGLOBIN: 11.1 g/dL — AB (ref 12.0–15.0)
LYMPHS ABS: 2.4 10*3/uL (ref 0.7–4.0)
Lymphocytes Relative: 34 % (ref 12–46)
MCH: 29.3 pg (ref 26.0–34.0)
MCHC: 32.8 g/dL (ref 30.0–36.0)
MCV: 89.2 fL (ref 78.0–100.0)
Monocytes Absolute: 0.8 10*3/uL (ref 0.1–1.0)
Monocytes Relative: 11 % (ref 3–12)
NEUTROS PCT: 55 % (ref 43–77)
Neutro Abs: 3.9 10*3/uL (ref 1.7–7.7)
PLATELETS: 232 10*3/uL (ref 150–400)
RBC: 3.79 MIL/uL — AB (ref 3.87–5.11)
RDW: 14.6 % (ref 11.5–15.5)
WBC: 7.1 10*3/uL (ref 4.0–10.5)

## 2014-11-22 MED ORDER — ENALAPRIL MALEATE 5 MG PO TABS
5.0000 mg | ORAL_TABLET | Freq: Every day | ORAL | Status: DC
  Administered 2014-11-22: 5 mg via ORAL
  Filled 2014-11-22: qty 1

## 2014-11-22 NOTE — Discharge Summary (Signed)
Patient ID: Sharon Mendoza MRN: [id number] DOB/AGE: [birth year] 44 y.o.  Admit date: 11/21/2014 Discharge date: 11/22/2014  Admission Diagnoses: Approximately 3 mm x 2 mm 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  Approximately 2.3 mm x 1.5 mm irregular right anterior choroidal artery region aneurysm.  Discharge Diagnoses:  Active Problems:   Brain aneurysm   Discharged Condition: stable/improved  Hospital Course: Pt was referred to Dr Estanislado Pandy and consulted for evaluation and discussion regarding 2 Intracranial aneurysms. R Posterior Communicating artery aneurysm and R Anterior Choroidal Artery aneurysm noted on imaging. Procedure was scheduled for 11/21/14 Cerebral arteriogram and Pipeline stent was placed at R Internal Carotid Artery using image guidance with Dr Estanislado Pandy. Pt tolerated procedure well. Overnight stay in Neuro ICU was without event. Eating and drinking well;good urine output/ yellow. Denies headache and N/V Dr Estanislado Pandy has seen and examined pt. Plan for discharge today. Pt needs to urinate on own and ambulate before dc.   Consults: None  Significant Diagnostic Studies: Cerebral arteriogram  Treatments: R Internal Carotid Artery Pipeline stent placement  Discharge Exam: Blood pressure 105/65, pulse 76, temperature 98.5 F (36.9 C), temperature source Oral, resp. rate 17, height 5\' 3"  (1.6 m), weight 132 lb 7.9 oz (60.1 kg), SpO2 99 %.  PE:  A/O; appropriate Smile = Face symmetrical Puffs cheeks = Heart: RRR Lungs: CTA Abd: soft; no masses; NT Extr: FROM; good strength and sensation = Rt groin: NT; no bleeding; no hematoma Rt foot: 2+ pulses  Results for orders placed or performed during the hospital encounter of 11/21/14  MRSA PCR Screening  Result Value Ref Range   MRSA by PCR NEGATIVE NEGATIVE  Platelet inhibition p2y12  (not at [facility])  Result Value Ref Range   Platelet Function  P2Y12 160 (L) 194 - 418 PRU    Heparin level (unfractionated)  Result Value Ref Range   Heparin Unfractionated 0.11 (L) 0.30 - 0.70 IU/mL  Basic metabolic panel  Result Value Ref Range   Sodium 138 135 - 145 mmol/L   Potassium 3.4 (L) 3.5 - 5.1 mmol/L   Chloride 109 101 - 111 mmol/L   CO2 25 22 - 32 mmol/L   Glucose, Bld 127 (H) 65 - 99 mg/dL   BUN <5 (L) 6 - 20 mg/dL   Creatinine, Ser 0.75 0.44 - 1.00 mg/dL   Calcium 8.2 (L) 8.9 - 10.3 mg/dL   GFR calc non Af Amer >60 >60 mL/min   GFR calc Af Amer >60 >60 mL/min   Anion gap 4 (L) 5 - 15  CBC WITH DIFFERENTIAL  Result Value Ref Range   WBC 7.1 4.0 - 10.5 K/uL   RBC 3.79 (L) 3.87 - 5.11 MIL/uL   Hemoglobin 11.1 (L) 12.0 - 15.0 g/dL   HCT 33.8 (L) 36.0 - 46.0 %   MCV 89.2 78.0 - 100.0 fL   MCH 29.3 26.0 - 34.0 pg   MCHC 32.8 30.0 - 36.0 g/dL   RDW 14.6 11.5 - 15.5 %   Platelets 232 150 - 400 K/uL   Neutrophils Relative % 55 43 - 77 %   Neutro Abs 3.9 1.7 - 7.7 K/uL   Lymphocytes Relative 34 12 - 46 %   Lymphs Abs 2.4 0.7 - 4.0 K/uL   Monocytes Relative 11 3 - 12 %   Monocytes Absolute 0.8 0.1 - 1.0 K/uL   Eosinophils Relative 0 0 - 5 %   Eosinophils Absolute 0.0 0.0 - 0.7 K/uL   Basophils Relative 0 0 -  1 %   Basophils Absolute 0.0 0.0 - 0.1 K/uL  APTT  Result Value Ref Range   aPTT 34 24 - 37 seconds    Disposition: R ICA pipeline stent placed in IR with Dr Estanislado Pandy Plan for discharge today Pt has done well overnight without event. Continue all home meds Continue ASA 325 and Plavix 75 daily 2 week follow up with Dr Estanislado Pandy Pt has good understanding of dc instructions Agreeable   Discharge Instructions    Call MD for:  difficulty breathing, headache or visual disturbances    Complete by:  As directed      Call MD for:  extreme fatigue    Complete by:  As directed      Call MD for:  hives    Complete by:  As directed      Call MD for:  persistant dizziness or light-headedness    Complete by:  As directed      Call MD for:  persistant  nausea and vomiting    Complete by:  As directed      Call MD for:  redness, tenderness, or signs of infection (pain, swelling, redness, odor or green/yellow discharge around incision site)    Complete by:  As directed      Call MD for:  severe uncontrolled pain    Complete by:  As directed      Call MD for:  temperature >100.4    Complete by:  As directed      Diet - low sodium heart healthy    Complete by:  As directed      Discharge instructions    Complete by:  As directed   Continue all home meds; continue ASA 325 and Plavix 75 mg daily; follow up with Dr Estanislado Pandy 2 weeks---call [phone] if questions or concerns; scheduler will call pt with time and date of follow up     Discharge wound care:    Complete by:  As directed   Replace pressure bandage with band aid; may shower today; keep clean band aid on Rt groin site daily x 1 week     Driving Restrictions    Complete by:  As directed   No driving x 2 weeks     Increase activity slowly    Complete by:  As directed   Restful for few days; gradual increase activity  Restful for few days; gradual increase activity     Lifting restrictions    Complete by:  As directed   No lifting over 10 lbs x 2 weeks            Medication List    TAKE these medications        aspirin 325 MG tablet  Take 325 mg by mouth daily.     cetirizine 10 MG tablet  Commonly known as:  ZYRTEC  Take 10 mg by mouth daily.     clopidogrel 75 MG tablet  Commonly known as:  PLAVIX  Take 75 mg by mouth daily.     enalapril 5 MG tablet  Commonly known as:  VASOTEC  Take 1 tablet (5 mg total) by mouth daily.     Norethindrone-Ethinyl Estradiol-Fe Biphas 1 MG-10 MCG / 10 MCG tablet  Commonly known as:  LO LOESTRIN FE  Take 1 tablet by mouth daily.     pantoprazole 40 MG tablet  Commonly known as:  PROTONIX  Take 1 tablet (40 mg total) by mouth daily.  Vitamin D (Cholecalciferol) 1000 UNITS Tabs  Take 2,000 Units by mouth daily.           Signed: Melynda Krzywicki A 11/22/2014, 8:37 AM   I have spent Greater Than 30 Minutes discharging Jamyra Reveles.

## 2014-11-24 ENCOUNTER — Encounter (HOSPITAL_COMMUNITY): Payer: Self-pay | Admitting: Emergency Medicine

## 2014-11-24 ENCOUNTER — Emergency Department (HOSPITAL_COMMUNITY)
Admission: EM | Admit: 2014-11-24 | Discharge: 2014-11-24 | Disposition: A | Discharge status: Home / Self Care | Payer: 59 | Attending: Emergency Medicine | Admitting: Emergency Medicine

## 2014-11-24 DIAGNOSIS — R232 Flushing: Secondary | ICD-10-CM | POA: Insufficient documentation

## 2014-11-24 DIAGNOSIS — R21 Rash and other nonspecific skin eruption: Secondary | ICD-10-CM | POA: Insufficient documentation

## 2014-11-24 DIAGNOSIS — Z7982 Long term (current) use of aspirin: Secondary | ICD-10-CM | POA: Diagnosis not present

## 2014-11-24 DIAGNOSIS — I10 Essential (primary) hypertension: Secondary | ICD-10-CM | POA: Insufficient documentation

## 2014-11-24 DIAGNOSIS — Z79899 Other long term (current) drug therapy: Secondary | ICD-10-CM | POA: Diagnosis not present

## 2014-11-24 DIAGNOSIS — K219 Gastro-esophageal reflux disease without esophagitis: Secondary | ICD-10-CM | POA: Diagnosis not present

## 2014-11-24 DIAGNOSIS — R220 Localized swelling, mass and lump, head: Secondary | ICD-10-CM | POA: Diagnosis present

## 2014-11-24 NOTE — ED Provider Notes (Signed)
CSN: [id number]     Arrival date & time 11/24/14  2694 History   First MD Initiated Contact with Patient 11/24/14 [phone]     Chief Complaint  Patient presents with  . Post-op Problem     (Consider location/radiation/quality/duration/timing/severity/associated sxs/prior Treatment) HPI  44 year old female presents with about 11 hours of bilateral facial swelling and redness. Started on her right cheek and now is also present on the left cheek. Took Benadryl at home (25 mg) without much relief. No vomiting, trouble breathing, trouble swallowing, or throat closing sensation. No rash anywhere else. These areas are not itchy. 3 days ago she had an aneurysm coiled and has since had no significant issues. Called her interventional radiologist who stated it might be due to contrast and advise the Benadryl. Patient has had contrast before with no acute issues. No new medicines, new animals, new foods, or new other allergens. Last took Benadryl at midnight. No dental pain, oral pain or oral swelling.  Past Medical History  Diagnosis Date  . Hypertension   . Mitral valve regurgitation   . Gastroesophageal reflux   . Abnormal pap   . Chest pain radiating to arm   . PONV (postoperative nausea and vomiting)    Past Surgical History  Procedure Laterality Date  . Cesarean section  09/2005  . Myomectomy  2005  . Cardiovascular stress test  05/19/11    05/19/11 Nuclear stress test ([facility]): Normal study, EF 87%  . Ct coronary ca scoring  06/23/12    06/23/12 Cardiac CTA ([place]): Ca score 0. Right dominant. Normal coronaries.  . US echocardiography  05/18/11    05/18/11 Echo ([place]): NL chamber size, LV systolic function, wall motion. LVEF 77%. Mild LVH. Mild MR/TR. NL PAP.  [facility] Radiology with anesthesia N/A 11/21/2014    Procedure: Embolization;  Surgeon: Luanne Bras, MD;  Location: [place];  Service: Radiology;  Laterality: N/A;  . Intracranial aneurysm repair     Family  History  Problem Relation Age of Onset  . Hypertension Mother   . Liver disease Mother   . Cancer Father     colon  . Heart disease Brother   . Aneurysm Brother   . Heart attack Brother   . Breast cancer Brother   . Asthma Son   . Aneurysm Sister   . Aneurysm Brother   . Aneurysm Sister   . Heart attack Sister   . Aneurysm Sister    Social History  Substance Use Topics  . Smoking status: Never Smoker   . Smokeless tobacco: None  . Alcohol Use: No   OB History    Gravida Para Term Preterm AB TAB SAB Ectopic Multiple Living   1 1        1      Review of Systems  HENT: Positive for facial swelling. Negative for dental problem, trouble swallowing and voice change.   Respiratory: Negative for shortness of breath.   Gastrointestinal: Negative for vomiting.  Skin: Positive for rash.  All other systems reviewed and are negative.     Allergies  Review of patient's allergies indicates no known allergies.  Home Medications   Prior to Admission medications   Medication Sig Start Date End Date Taking? Authorizing Provider  aspirin 325 MG tablet Take 325 mg by mouth daily.    Historical Provider, MD  cetirizine (ZYRTEC) 10 MG tablet Take 10 mg by mouth daily.    Historical Provider, MD  clopidogrel (PLAVIX) 75 MG tablet Take  75 mg by mouth daily.    Historical Provider, MD  enalapril (VASOTEC) 5 MG tablet Take 1 tablet (5 mg total) by mouth daily. 11/28/13   Elveria Rising, MD  Norethindrone-Ethinyl Estradiol-Fe Biphas (LO LOESTRIN FE) 1 MG-10 MCG / 10 MCG tablet Take 1 tablet by mouth daily. 11/28/13   Elveria Rising, MD  pantoprazole (PROTONIX) 40 MG tablet Take 1 tablet (40 mg total) by mouth daily. 11/28/13   Elveria Rising, MD  Vitamin D, Cholecalciferol, 1000 UNITS TABS Take 2,000 Units by mouth daily.    Historical Provider, MD   BP 139/82 mmHg  Pulse 70  Temp(Src) 98.7 F (37.1 C) (Oral)  Resp 17  Ht 5\' 3"  (1.6 m)  Wt 130 lb (58.968 kg)  BMI 23.03 kg/m2  SpO2  98% Physical Exam  Constitutional: She is oriented to person, place, and time. She appears well-developed and well-nourished.  HENT:  Head: Normocephalic and atraumatic.    Right Ear: External ear normal.  Left Ear: External ear normal.  Nose: Nose normal.  Mouth/Throat: Uvula is midline and oropharynx is clear and moist. No trismus in the jaw. No uvula swelling.  No periorbital swelling  Eyes: Right eye exhibits no discharge. Left eye exhibits no discharge.  Neck: Neck supple.  Cardiovascular: Normal rate, regular rhythm and normal heart sounds.   Pulmonary/Chest: Effort normal and breath sounds normal.  Abdominal: Soft. There is no tenderness.  Neurological: She is alert and oriented to person, place, and time.  Skin: Skin is warm and dry.  Nursing note and vitals reviewed.   ED Course  Procedures (including critical care time) Labs Review Labs Reviewed - No data to display  Imaging Review No results found. I have personally reviewed and evaluated these images and lab results as part of my medical decision-making.   EKG Interpretation None      MDM   Final diagnoses:  Facial flushing    Patient's mild facial swelling is of unclear etiology. Could possibly be related to the contrast but this was given over 48 hours ago I think this is less likely. No other acute allergens that could cause this. Is not particular itchy but is mildly warm. There are no signs of a more severe allergic reaction. Given the mild appearance of think Benadryl is adequate, I have asked her to increase her dose to 50 mg and recommend following up with PCP and/or surgeon. No apparent complications of her recent aneurysmal repair including no significant headache or current headache.    Sherwood Gambler, MD 11/24/14 [phone]

## 2014-11-24 NOTE — ED Notes (Signed)
Pt left with all belongings and refused wheelchair. Discharge instructions were reviewed and all questions were answered.  

## 2014-11-24 NOTE — ED Notes (Signed)
Pt arrives from home c/o of bilateral eye swelling and rash to cheeks since yesterday, spoke with surgeon and he advised benadryl. Not effective at this time. Pt denies painful. States brain aneurysm surgery on 9/1, released 9/2. States went through groin.

## 2014-11-26 NOTE — Anesthesia Postprocedure Evaluation (Signed)
  Anesthesia Post-op Note  Patient: Sharon Mendoza  Procedure(s) Performed: Procedure(s): Embolization (N/A)  Patient Location: PACU  Anesthesia Type:General  Level of Consciousness: awake  Airway and Oxygen Therapy: Patient Spontanous Breathing  Post-op Pain: none  Post-op Assessment: Post-op Vital signs reviewed, Patient's Cardiovascular Status Stable, Respiratory Function Stable, Patent Airway, No signs of Nausea or vomiting and Pain level controlled LLE Motor Response: Purposeful movement, Responds to commands   RLE Motor Response: Purposeful movement, Responds to commands        Post-op Vital Signs: Reviewed and stable  Last Vitals:  Filed Vitals:   11/22/14 1100  BP: 104/64  Pulse: 79  Temp:   Resp: 17    Complications: No apparent anesthesia complications

## 2014-12-02 ENCOUNTER — Ambulatory Visit: Payer: Managed Care, Other (non HMO) | Admitting: Gynecology

## 2014-12-04 ENCOUNTER — Ambulatory Visit (INDEPENDENT_AMBULATORY_CARE_PROVIDER_SITE_OTHER): Payer: 59 | Admitting: Obstetrics and Gynecology

## 2014-12-04 ENCOUNTER — Encounter: Payer: Self-pay | Admitting: Obstetrics and Gynecology

## 2014-12-04 VITALS — BP 110/78 | HR 84 | Resp 14 | Ht 63.25 in | Wt 128.0 lb

## 2014-12-04 DIAGNOSIS — Z01419 Encounter for gynecological examination (general) (routine) without abnormal findings: Secondary | ICD-10-CM | POA: Diagnosis not present

## 2014-12-04 DIAGNOSIS — Z1239 Encounter for other screening for malignant neoplasm of breast: Secondary | ICD-10-CM

## 2014-12-04 DIAGNOSIS — Z0000 Encounter for general adult medical examination without abnormal findings: Secondary | ICD-10-CM

## 2014-12-04 DIAGNOSIS — Z23 Encounter for immunization: Secondary | ICD-10-CM | POA: Diagnosis not present

## 2014-12-04 DIAGNOSIS — D259 Leiomyoma of uterus, unspecified: Secondary | ICD-10-CM

## 2014-12-04 DIAGNOSIS — I729 Aneurysm of unspecified site: Secondary | ICD-10-CM | POA: Diagnosis not present

## 2014-12-04 DIAGNOSIS — I10 Essential (primary) hypertension: Secondary | ICD-10-CM | POA: Diagnosis not present

## 2014-12-04 LAB — POCT URINALYSIS DIPSTICK
BILIRUBIN UA: NEGATIVE
Blood, UA: NEGATIVE
GLUCOSE UA: NEGATIVE
Ketones, UA: NEGATIVE
LEUKOCYTES UA: NEGATIVE
NITRITE UA: NEGATIVE
Protein, UA: NEGATIVE
UROBILINOGEN UA: NEGATIVE
pH, UA: 7

## 2014-12-04 LAB — LIPID PANEL
CHOL/HDL RATIO: 3.9 ratio (ref <=5.0)
CHOLESTEROL: 239 mg/dL — AB (ref 125–200)
HDL: 62 mg/dL (ref >=46)
LDL CALC: 144 mg/dL — AB (ref <130)
TRIGLYCERIDES: 164 mg/dL — AB (ref <150)
VLDL: 33 mg/dL — AB (ref <30)

## 2014-12-04 MED ORDER — NORETHINDRONE 0.35 MG PO TABS: 1.0000 | ORAL_TABLET | Freq: Every day | ORAL | Status: DC

## 2014-12-04 MED ORDER — ENALAPRIL MALEATE 5 MG PO TABS: 5.0000 mg | ORAL_TABLET | Freq: Every day | ORAL | Status: DC

## 2014-12-04 NOTE — Progress Notes (Signed)
Patient ID: Sharon Mendoza, female   DOB: [birth year], 44 y.o.   MRN: [id number] 44 y.o. G1P1 SingleAfrican AmericanF here for annual exam. Patient wants to discuss changing her birth control  The patient has multiple fibroids, she can feel them abdominally. Occasionally uncomfortable, frequent urination. No bowel issues. Not sexually active. In the past she has had issues with pain with intercourse, deep and entry and post coital bleeding. Not sexually active x 4 years. She has a h/o a brain aneurysms x 2, she had a stent placed. She is currently on plavix, has a f/u appointment later this month.     Patient's last menstrual period was 05/21/2014 (approximate).          Sexually active: Yes.    The current method of family planning is OCP (estrogen/progesterone).    Exercising: No.  The patient does not participate in regular exercise at present. Smoker:  no  Health Maintenance: Pap:  06-22-12 WNL NEG HR HPV History of abnormal Pap:  Yes years ago- repeated PAP and it was normal  MMG:  2008 - WNL - Needs to schedule Colonoscopy:  03-22-08 Normal (Father with colon cancer in his mid 89's, died in his 34's) BMD:   N/A TDaP:  unsure Gardasil: N/A   reports that she has never smoked. She has never used smokeless tobacco. She reports that she does not drink alcohol or use illicit drugs. She has a 28 year old son, raising him on her own. 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 Works 3-4 shifts a week.   Past Medical History  Diagnosis Date  . Hypertension   . Mitral valve regurgitation   . Gastroesophageal reflux   . Abnormal pap   . Chest pain radiating to arm   . PONV (postoperative nausea and vomiting)     Past Surgical History  Procedure Laterality Date  . Cesarean section  09/2005  . Myomectomy  2005  . Cardiovascular stress test  05/19/11    05/19/11 Nuclear stress test ([facility]): Normal study, EF 87%  . Ct coronary ca scoring  06/23/12     06/23/12 Cardiac CTA ([place]): Ca score 0. Right dominant. Normal coronaries.  . US echocardiography  05/18/11    05/18/11 Echo ([place]): NL chamber size, LV systolic function, wall motion. LVEF 77%. Mild LVH. Mild MR/TR. NL PAP.  Marland Kitchen Radiology with anesthesia N/A 11/21/2014    Procedure: Embolization;  Surgeon: Luanne Bras, MD;  Location: [place];  Service: Radiology;  Laterality: N/A;  . Intracranial aneurysm repair      Current Outpatient Prescriptions  Medication Sig Dispense Refill  . aspirin 325 MG tablet Take 325 mg by mouth daily.    . cetirizine (ZYRTEC) 10 MG tablet Take 10 mg by mouth daily.    . clopidogrel (PLAVIX) 75 MG tablet Take 75 mg by mouth daily.    . enalapril (VASOTEC) 5 MG tablet Take 1 tablet (5 mg total) by mouth daily. 30 tablet 12  . Norethindrone-Ethinyl Estradiol-Fe Biphas (LO LOESTRIN FE) 1 MG-10 MCG / 10 MCG tablet Take 1 tablet by mouth daily. 3 Package 3  . pantoprazole (PROTONIX) 40 MG tablet Take 1 tablet (40 mg total) by mouth daily. 30 tablet 11  . Vitamin D, Cholecalciferol, 1000 UNITS TABS Take 2,000 Units by mouth daily.     No current facility-administered medications for this visit.    Family History  Problem Relation Age of Onset  .  Hypertension Mother   . Liver disease Mother   . Cancer Father     colon  . Heart disease Brother   . Aneurysm Brother   . Heart attack Brother   . Breast cancer Brother   . Asthma Son   . Aneurysm Sister   . Aneurysm Brother   . Aneurysm Sister   . Heart attack Sister   . Aneurysm Sister   She is one of 14 kids, 6 siblings have had aneurysms, 3 of them died. Father with colon cancer in his 54's  Review of Systems  Constitutional: Negative.   Eyes: Negative.   Respiratory: Negative.   Cardiovascular: Negative.   Gastrointestinal: Negative.   Endocrine: Negative.   Genitourinary: Negative.   Musculoskeletal: Negative.   Skin: Negative.   Allergic/Immunologic: Negative.    Neurological: Positive for headaches.  Psychiatric/Behavioral: Negative.   She has been having headaches since her aneurysm surgery.   Exam:   BP 110/78 mmHg  Pulse 84  Resp 14  Ht 5' 3.25" (1.607 m)  Wt 128 lb (58.06 kg)  BMI 22.48 kg/m2  LMP 05/21/2014 (Approximate)  Weight change: @WEIGHTCHANGE @ Height:   Height: 5' 3.25" (160.7 cm)  Ht Readings from Last 3 Encounters:  12/04/14 5' 3.25" (1.607 m)  11/24/14 5\' 3"  (1.6 m)  11/21/14 5\' 3"  (1.6 m)    General appearance: alert, cooperative and appears stated age Head: Normocephalic, without obvious abnormality, atraumatic Neck: no adenopathy, supple, symmetrical, trachea midline and thyroid normal to inspection and palpation Lungs: clear to auscultation bilaterally Breasts: normal appearance, no masses or tenderness, nipples inverted bilaterally, no change Heart: regular rate and rhythm Abdomen: soft, non-tender; bowel sounds normal; no masses,  no organomegaly Extremities: extremities normal, atraumatic, no cyanosis or edema Skin: Skin color, texture, turgor normal. No rashes or lesions Lymph nodes: Cervical, supraclavicular, and axillary nodes normal. No abnormal inguinal nodes palpated Neurologic: Grossly normal   Pelvic: External genitalia:  no lesions              Urethra:  normal appearing urethra with no masses, tenderness or lesions              Bartholins and Skenes: normal                 Vagina: normal appearing vagina with normal color and discharge, no lesions              Cervix: no lesions and friable               Bimanual Exam:  Uterus:  Uterus is irregularly shaped, 8 week sized, fibroid felt coming off of the right fundal region. Uterus is mildly tender to palpation, mobile              Adnexa: no mass, fullness, tenderness               Rectovaginal: Confirms               Anus:  normal sphincter tone, no lesions  Chaperone was present for exam.  A:  Well Woman with normal exam  Fibroid uterus, h/o  heavy crampy cycles. No bleeding on OCP's, but risk factors for being on OCP's  FH of colon cancer  H/O aneurysm, s/p stent placement  HTN  P:   Fibroid uterus, reviewed her last ultrasound, many small fibroids, largest 4+ cm.   She needs a primary MD to treat her HTN and manage her care, names given  Will  call in 2 months of her vasotec to get her into see a primary  Discussed changing her contraception, aware of risks of OCP's  Will change to micronor, f/u in 3 months  We discussed the mirena IUD, likely her cavity length would be okay, discussed possible sonohysterogram to check for submucosal myomas. Reviewed that contraceptive effectiveness could be reduced if her cavity length was over 10  cm  FLP, pre-op blood work last month was normal  Mammogram (we will set it up for her)  Colonoscopy over due

## 2014-12-04 NOTE — Patient Instructions (Signed)

## 2014-12-10 ENCOUNTER — Telehealth (HOSPITAL_COMMUNITY): Payer: Self-pay | Admitting: Interventional Radiology

## 2014-12-10 ENCOUNTER — Ambulatory Visit (HOSPITAL_COMMUNITY): Admission: RE | Admit: 2014-12-10 | Payer: 59 | Source: Ambulatory Visit

## 2014-12-10 NOTE — Telephone Encounter (Signed)
Called pt, left VM that we need to reschedule her f/u office visit today. Pt did call back and was rescheduled. JM

## 2014-12-11 ENCOUNTER — Telehealth (HOSPITAL_COMMUNITY): Payer: Self-pay | Admitting: *Deleted

## 2014-12-11 NOTE — Telephone Encounter (Signed)
Called to inform patient that Dr. Estanislado Pandy sent a refill for her Plavix, informed her that on her next appointment we will repeat a P2Y12

## 2014-12-25 ENCOUNTER — Ambulatory Visit (HOSPITAL_COMMUNITY): Admission: RE | Admit: 2014-12-25 | Payer: 59 | Source: Ambulatory Visit

## 2014-12-26 ENCOUNTER — Ambulatory Visit (HOSPITAL_COMMUNITY)
Admission: RE | Admit: 2014-12-26 | Discharge: 2014-12-26 | Disposition: A | Discharge status: Home / Self Care | Payer: 59 | Source: Ambulatory Visit | Attending: Radiology | Admitting: Radiology

## 2014-12-26 DIAGNOSIS — Z7982 Long term (current) use of aspirin: Secondary | ICD-10-CM | POA: Insufficient documentation

## 2014-12-26 DIAGNOSIS — I671 Cerebral aneurysm, nonruptured: Secondary | ICD-10-CM | POA: Insufficient documentation

## 2014-12-26 DIAGNOSIS — Z7902 Long term (current) use of antithrombotics/antiplatelets: Secondary | ICD-10-CM | POA: Insufficient documentation

## 2014-12-26 DIAGNOSIS — R51 Headache: Secondary | ICD-10-CM | POA: Insufficient documentation

## 2014-12-26 DIAGNOSIS — Z48812 Encounter for surgical aftercare following surgery on the circulatory system: Secondary | ICD-10-CM | POA: Diagnosis not present

## 2014-12-26 LAB — PLATELET INHIBITION P2Y12: Platelet Function  P2Y12: 1 [PRU] — ABNORMAL LOW (ref 194–418)

## 2014-12-31 ENCOUNTER — Other Ambulatory Visit (HOSPITAL_COMMUNITY): Payer: Self-pay | Admitting: Interventional Radiology

## 2014-12-31 DIAGNOSIS — I729 Aneurysm of unspecified site: Secondary | ICD-10-CM

## 2015-01-01 ENCOUNTER — Telehealth (HOSPITAL_COMMUNITY): Payer: Self-pay | Admitting: *Deleted

## 2015-01-01 NOTE — Telephone Encounter (Signed)
Called and spoke with patient.  After reviewing labs Dr. Estanislado Pandy instructed patient to take 37.5mg  of Plavix daily and 81mg  of ASA daily.  In 1 week repeat P2Y12.

## 2015-01-06 ENCOUNTER — Other Ambulatory Visit: Payer: Self-pay | Admitting: *Deleted

## 2015-01-06 DIAGNOSIS — K219 Gastro-esophageal reflux disease without esophagitis: Secondary | ICD-10-CM

## 2015-01-06 MED ORDER — PANTOPRAZOLE SODIUM 40 MG PO TBEC: 40.0000 mg | DELAYED_RELEASE_TABLET | Freq: Every day | ORAL | Status: DC

## 2015-01-06 NOTE — Telephone Encounter (Signed)
Spoke with patient. Advised of message as seen below from [place]. Patient states. "I understand you guys do not like to fill much of anything. I will just take the one refill and turn it over to my other doctor in November." Advised PCP will need to manage her HTN medication as well. Advised this is so she is appropriately monitored and these medications can be managed. Patient is agreeable.  Pantoprazole 40 mg daily 30 1RF sent to pharmacy on file.  Routing to provider for final review. Patient agreeable to disposition. Will close encounter.

## 2015-01-06 NOTE — Telephone Encounter (Signed)
Medication refill request: Pantoprazole  Last AEX:  12/04/14 JJ Next AEX: 03/05/15 JJ Last MMG (if hormonal medication request): 2008? Refill authorized: 11/28/13 #30tabs / 11R. Today please advise.

## 2015-01-06 NOTE — Telephone Encounter (Signed)
Please inform the patient that the Pantoprazole is typically used for short term use. I'm happy to give her a month supply with one refill, but she really should discuss long term use with her primary. Please see if she has a primary yet, she is going to need one to manage her HTN as well. Please send in 30 tabs with 1 refill.

## 2015-01-08 ENCOUNTER — Other Ambulatory Visit: Payer: Self-pay | Admitting: Radiology

## 2015-01-08 LAB — PLATELET INHIBITION P2Y12: Platelet Function  P2Y12: 0 [PRU] — ABNORMAL LOW (ref 194–418)

## 2015-01-21 ENCOUNTER — Telehealth (HOSPITAL_COMMUNITY): Payer: Self-pay | Admitting: *Deleted

## 2015-01-22 ENCOUNTER — Telehealth (HOSPITAL_COMMUNITY): Payer: Self-pay | Admitting: *Deleted

## 2015-01-22 NOTE — Telephone Encounter (Signed)
Called patient regarding P2Y12 results, pt coming in tomorrow for repeat P2Y12 per Dr. Anette Guarneri instruction

## 2015-01-24 ENCOUNTER — Other Ambulatory Visit: Payer: Self-pay | Admitting: Radiology

## 2015-01-24 LAB — PLATELET INHIBITION P2Y12: Platelet Function  P2Y12: 5 [PRU] — ABNORMAL LOW (ref 194–418)

## 2015-01-30 ENCOUNTER — Telehealth (HOSPITAL_COMMUNITY): Payer: Self-pay | Admitting: *Deleted

## 2015-01-30 NOTE — Telephone Encounter (Signed)
Called and left message for patient regarding instructions on Plavix as directed by Dr. Kathi Ludwig.  Pt to take Plavix 37.5mg  every other day and Asa 81 mg daily.  Gave pt call back number for questions or concerns

## 2015-02-18 ENCOUNTER — Telehealth (HOSPITAL_COMMUNITY): Payer: Self-pay | Admitting: *Deleted

## 2015-02-18 ENCOUNTER — Ambulatory Visit (INDEPENDENT_AMBULATORY_CARE_PROVIDER_SITE_OTHER): Payer: 59 | Admitting: Cardiovascular Disease

## 2015-02-18 ENCOUNTER — Encounter: Payer: Self-pay | Admitting: Cardiovascular Disease

## 2015-02-18 VITALS — BP 120/84 | HR 73 | Ht 63.0 in | Wt 125.0 lb

## 2015-02-18 DIAGNOSIS — I10 Essential (primary) hypertension: Secondary | ICD-10-CM

## 2015-02-18 DIAGNOSIS — I158 Other secondary hypertension: Secondary | ICD-10-CM | POA: Diagnosis not present

## 2015-02-18 DIAGNOSIS — E785 Hyperlipidemia, unspecified: Secondary | ICD-10-CM | POA: Diagnosis not present

## 2015-02-18 DIAGNOSIS — R079 Chest pain, unspecified: Secondary | ICD-10-CM | POA: Insufficient documentation

## 2015-02-18 LAB — LIPID PANEL
CHOL/HDL RATIO: 2.7 ratio (ref <=5.0)
CHOLESTEROL: 190 mg/dL (ref 125–200)
HDL: 70 mg/dL (ref >=46)
LDL Cholesterol: 107 mg/dL (ref <130)
TRIGLYCERIDES: 65 mg/dL (ref <150)
VLDL: 13 mg/dL (ref <30)

## 2015-02-18 LAB — HEPATIC FUNCTION PANEL
ALT: 8 U/L (ref 6–29)
AST: 16 U/L (ref 10–35)
Albumin: 4.3 g/dL (ref 3.6–5.1)
Alkaline Phosphatase: 59 U/L (ref 33–115)
BILIRUBIN DIRECT: 0.1 mg/dL (ref <=0.2)
BILIRUBIN TOTAL: 0.4 mg/dL (ref 0.2–1.2)
Indirect Bilirubin: 0.3 mg/dL (ref 0.2–1.2)
Total Protein: 6.8 g/dL (ref 6.1–8.1)

## 2015-02-18 MED ORDER — NEBIVOLOL HCL 5 MG PO TABS: 5.0000 mg | ORAL_TABLET | Freq: Every day | ORAL | Status: DC

## 2015-02-18 NOTE — Assessment & Plan Note (Signed)
History of hyperlipidemia with recent lipid profile performed 12/04/14 revealing a total stroke to 39 up from 161 a year before her LDL of 144 and HDL 62. She did admit to dietary indiscretion however recently she has improved her diet. We will recheck a lipid and liver profile

## 2015-02-18 NOTE — Progress Notes (Signed)
02/18/2015 Sharon Mendoza   [birth year]  PT:[id number]  Primary Physician Salvadore Dom, MD Primary Cardiologist: Lorretta Harp MD Renae Gloss   HPI:  Sharon Mendoza is a very pleasant 44 year old divorced African-American female mother of one child who works as a Software engineer at [employer]. She was self-referred for evaluation treatment of hypertension and atypical chest pain. I last saw hernia office 9 years ago when she was referred for palpitations during her pregnancy which subsequently resolved. She has a history of hypertension, hyperlipidemia and family history with a brother who died at age 45 of a myocardial infarction and a sister who had an MI at age 72. She does have a strong history of cerebral aneurysms. 5 her 14 siblings have had cerebral aneurysms one of whom died from this. She has had cerebral aneurysm stenting by Dr. Patrecia Pour recently as well. She does get atypical chest pain over the last several months which can last for hours at a time  Current Outpatient Prescriptions  Medication Sig Dispense Refill  . aspirin 81 MG tablet Take 81 mg by mouth daily.    . cetirizine (ZYRTEC) 10 MG tablet Take 10 mg by mouth daily.    . clopidogrel (PLAVIX) 75 MG tablet Take 37.5 mg by mouth every other day.     . enalapril (VASOTEC) 5 MG tablet Take 1 tablet (5 mg total) by mouth daily. 30 tablet 1  . norethindrone (MICRONOR,CAMILA,ERRIN) 0.35 MG tablet Take 1 tablet (0.35 mg total) by mouth daily. 3 Package 3  . pantoprazole (PROTONIX) 40 MG tablet Take 1 tablet (40 mg total) by mouth daily. 30 tablet 1  . Vitamin D, Cholecalciferol, 1000 UNITS TABS Take 2,000 Units by mouth daily.     No current facility-administered medications for this visit.    No Known Allergies  Social History   Social History  . Marital Status: Single    Spouse Name: N/A  . Number of Children: 1  . Years of Education: BS   Occupational History  . PHARMACIST [employer]   Social  History Main Topics  . Smoking status: Never Smoker   . Smokeless tobacco: Never Used  . Alcohol Use: No  . Drug Use: No  . Sexual Activity:    Partners: Male    Birth Control/ Protection: Pill     Comment: Loestrin   Other Topics Concern  . Not on file   Social History Narrative     Review of Systems: General: negative for chills, fever, night sweats or weight changes.  Cardiovascular: negative for chest pain, dyspnea on exertion, edema, orthopnea, palpitations, paroxysmal nocturnal dyspnea or shortness of breath Dermatological: negative for rash Respiratory: negative for cough or wheezing Urologic: negative for hematuria Abdominal: negative for nausea, vomiting, diarrhea, bright red blood per rectum, melena, or hematemesis Neurologic: negative for visual changes, syncope, or dizziness All other systems reviewed and are otherwise negative except as noted above.    Blood pressure 120/84, pulse 73, height 5\' 3"  (1.6 m), weight 125 lb (56.7 kg).  General appearance: alert and no distress Neck: no adenopathy, no carotid bruit, no JVD, supple, symmetrical, trachea midline and thyroid not enlarged, symmetric, no tenderness/mass/nodules Lungs: clear to auscultation bilaterally Heart: regular rate and rhythm, S1, S2 normal, no murmur, click, rub or gallop Extremities: extremities normal, atraumatic, no cyanosis or edema  EKG normal sinus rhythm at 73 without ST or T-wave changes. I personally reviewed this EKG  ASSESSMENT AND PLAN:   Hyperlipidemia History of  hyperlipidemia with recent lipid profile performed 12/04/14 revealing a total stroke to 39 up from 161 a year before her LDL of 144 and HDL 62. She did admit to dietary indiscretion however recently she has improved her diet. We will recheck a lipid and liver profile  Essential hypertension History of hypertension blood pressure measured at 120/84 pulse was 73 on enalapril 5 mg a day. She says her blood pressure does fluctuate  as does her pulse. I'm going to start Bystolic 5 mg in addition and we'll have her keep a blood pressure log on daily basis. She will see Erasmo Downer back in the office in one month to review and make further changes as necessary  Chest pain History of atypical chest pain the last several months. She does have a positive family history of heart disease as well as hyperlipidemia and hypertension. We will get a routine GXT to further evaluate      Lorretta Harp MD [facility], [facility] 02/18/2015 9:34 AM

## 2015-02-18 NOTE — Assessment & Plan Note (Signed)
History of atypical chest pain the last several months. She does have a positive family history of heart disease as well as hyperlipidemia and hypertension. We will get a routine GXT to further evaluate

## 2015-02-18 NOTE — Patient Instructions (Signed)
Medication Instructions:  Your physician has recommended you make the following change in your medication:  1) START Bystolic 5 mg by mouth ONCE daily   Labwork: Your physician recommends that you return for lab work in: FASTING (lipid/liver) The lab can be found on the FIRST FLOOR of out building in [address]   Testing/Procedures: Your physician has requested that you have an exercise tolerance test. For further information please visit [email or web address]. Please also follow instruction sheet, as given.    Follow-Up: Your physician recommends that you schedule a follow-up appointment in: 1 month with Erasmo Downer in [place] physician recommends that you schedule a follow-up appointment in: 3 months with Dr. Gwenlyn Found   Any Other Special Instructions Will Be Listed Below (If Applicable). Dr. Gwenlyn Found would like you to check your blood pressure DAILY for the next 4 weeks.  Keep a journal of these daily BP and heart rate reading and call our office with the results.      If you need a refill on your cardiac medications before your next appointment, please call your pharmacy.

## 2015-02-18 NOTE — Assessment & Plan Note (Signed)
History of hypertension blood pressure measured at 120/84 pulse was 73 on enalapril 5 mg a day. She says her blood pressure does fluctuate as does her pulse. I'm going to start Bystolic 5 mg in addition and we'll have her keep a blood pressure log on daily basis. She will see Sharon Mendoza back in the office in one month to review and make further changes as necessary

## 2015-02-19 ENCOUNTER — Other Ambulatory Visit: Payer: Self-pay | Admitting: Radiology

## 2015-02-20 ENCOUNTER — Other Ambulatory Visit (HOSPITAL_COMMUNITY): Payer: Self-pay | Admitting: Interventional Radiology

## 2015-02-20 ENCOUNTER — Ambulatory Visit (HOSPITAL_COMMUNITY)
Admission: RE | Admit: 2015-02-20 | Discharge: 2015-02-20 | Disposition: A | Discharge status: Home / Self Care | Payer: 59 | Source: Ambulatory Visit | Attending: Interventional Radiology | Admitting: Interventional Radiology

## 2015-02-20 ENCOUNTER — Encounter (HOSPITAL_COMMUNITY): Payer: Self-pay

## 2015-02-20 DIAGNOSIS — Z8249 Family history of ischemic heart disease and other diseases of the circulatory system: Secondary | ICD-10-CM | POA: Insufficient documentation

## 2015-02-20 DIAGNOSIS — I340 Nonrheumatic mitral (valve) insufficiency: Secondary | ICD-10-CM | POA: Diagnosis not present

## 2015-02-20 DIAGNOSIS — I10 Essential (primary) hypertension: Secondary | ICD-10-CM | POA: Diagnosis not present

## 2015-02-20 DIAGNOSIS — I729 Aneurysm of unspecified site: Secondary | ICD-10-CM

## 2015-02-20 DIAGNOSIS — Z7902 Long term (current) use of antithrombotics/antiplatelets: Secondary | ICD-10-CM | POA: Diagnosis not present

## 2015-02-20 DIAGNOSIS — E785 Hyperlipidemia, unspecified: Secondary | ICD-10-CM | POA: Insufficient documentation

## 2015-02-20 DIAGNOSIS — K219 Gastro-esophageal reflux disease without esophagitis: Secondary | ICD-10-CM | POA: Insufficient documentation

## 2015-02-20 DIAGNOSIS — I671 Cerebral aneurysm, nonruptured: Secondary | ICD-10-CM | POA: Insufficient documentation

## 2015-02-20 DIAGNOSIS — Z7982 Long term (current) use of aspirin: Secondary | ICD-10-CM | POA: Insufficient documentation

## 2015-02-20 DIAGNOSIS — Z48812 Encounter for surgical aftercare following surgery on the circulatory system: Secondary | ICD-10-CM | POA: Diagnosis not present

## 2015-02-20 LAB — CBC
HEMATOCRIT: 40.2 % (ref 36.0–46.0)
HEMOGLOBIN: 13 g/dL (ref 12.0–15.0)
MCH: 29.3 pg (ref 26.0–34.0)
MCHC: 32.3 g/dL (ref 30.0–36.0)
MCV: 90.7 fL (ref 78.0–100.0)
Platelets: 255 10*3/uL (ref 150–400)
RBC: 4.43 MIL/uL (ref 3.87–5.11)
RDW: 15.9 % — AB (ref 11.5–15.5)
WBC: 4.7 10*3/uL (ref 4.0–10.5)

## 2015-02-20 LAB — BASIC METABOLIC PANEL
Anion gap: 8 (ref 5–15)
BUN: 10 mg/dL (ref 6–20)
CHLORIDE: 105 mmol/L (ref 101–111)
CO2: 26 mmol/L (ref 22–32)
Calcium: 9.6 mg/dL (ref 8.9–10.3)
Creatinine, Ser: 0.86 mg/dL (ref 0.44–1.00)
GFR calc Af Amer: >60 mL/min (ref >60)
GFR calc non Af Amer: >60 mL/min (ref >60)
GLUCOSE: 81 mg/dL (ref 65–99)
POTASSIUM: 4 mmol/L (ref 3.5–5.1)
Sodium: 139 mmol/L (ref 135–145)

## 2015-02-20 LAB — PROTIME-INR
INR: 0.98 (ref 0.00–1.49)
Prothrombin Time: 13.2 seconds (ref 11.6–15.2)

## 2015-02-20 LAB — APTT: aPTT: 31 seconds (ref 24–37)

## 2015-02-20 LAB — HCG, SERUM, QUALITATIVE: Preg, Serum: NEGATIVE

## 2015-02-20 MED ORDER — LIDOCAINE HCL 1 % IJ SOLN
INTRAMUSCULAR | Status: AC
  Filled 2015-02-20: qty 20

## 2015-02-20 MED ORDER — MIDAZOLAM HCL 2 MG/2ML IJ SOLN
INTRAMUSCULAR | Status: AC | PRN
  Administered 2015-02-20: 0.5 mg via INTRAVENOUS
  Administered 2015-02-20: 1 mg via INTRAVENOUS

## 2015-02-20 MED ORDER — MIDAZOLAM HCL 2 MG/2ML IJ SOLN
INTRAMUSCULAR | Status: AC
  Filled 2015-02-20: qty 4

## 2015-02-20 MED ORDER — FENTANYL CITRATE (PF) 100 MCG/2ML IJ SOLN
INTRAMUSCULAR | Status: AC | PRN
  Administered 2015-02-20 (×2): 25 ug via INTRAVENOUS

## 2015-02-20 MED ORDER — IOHEXOL 300 MG/ML  SOLN
150.0000 mL | Freq: Once | INTRAMUSCULAR | Status: AC | PRN
  Administered 2015-02-20: 50 mL via INTRA_ARTERIAL

## 2015-02-20 MED ORDER — SODIUM CHLORIDE 0.9 % IV SOLN: INTRAVENOUS | Status: AC

## 2015-02-20 MED ORDER — SODIUM CHLORIDE 0.9 % IV SOLN
INTRAVENOUS | Status: DC
Start: 2015-02-20 — End: 2015-02-21
  Administered 2015-02-20: 08:00:00 via INTRAVENOUS

## 2015-02-20 MED ORDER — HEPARIN SOD (PORK) LOCK FLUSH 100 UNIT/ML IV SOLN
INTRAVENOUS | Status: AC
  Filled 2015-02-20: qty 20

## 2015-02-20 MED ORDER — HEPARIN SOD (PORK) LOCK FLUSH 100 UNIT/ML IV SOLN
INTRAVENOUS | Status: AC | PRN
  Administered 2015-02-20: 1000 [IU] via INTRAVENOUS

## 2015-02-20 MED ORDER — FENTANYL CITRATE (PF) 100 MCG/2ML IJ SOLN
INTRAMUSCULAR | Status: AC
  Filled 2015-02-20: qty 4

## 2015-02-20 NOTE — Sedation Documentation (Signed)
Right groin verified by receiving RN, Cruzita Lederer and Vania Rea IR tech. Area clean dry and intact. No swelling or hematoma noted

## 2015-02-20 NOTE — Sedation Documentation (Signed)
Patient is resting comfortably. 

## 2015-02-20 NOTE — Sedation Documentation (Signed)
Patient denies pain and is resting comfortably.  

## 2015-02-20 NOTE — Discharge Instructions (Signed)
Angiogram, Care After °Refer to this sheet in the next few weeks. These instructions provide you with information about caring for yourself after your procedure. Your health care provider may also give you more specific instructions. Your treatment has been planned according to current medical practices, but problems sometimes occur. Call your health care provider if you have any problems or questions after your procedure. °WHAT TO EXPECT AFTER THE PROCEDURE °After your procedure, it is typical to have the following: °· Bruising at the catheter insertion site that usually fades within 1-2 weeks. °· Blood collecting in the tissue (hematoma) that may be painful to the touch. It should usually decrease in size and tenderness within 1-2 weeks. °HOME CARE INSTRUCTIONS °· Take medicines only as directed by your health care provider. °· You may shower 24-48 hours after the procedure or as directed by your health care provider. Remove the bandage (dressing) and gently wash the site with plain soap and water. Pat the area dry with a clean towel. Do not rub the site, because this may cause bleeding. °· Do not take baths, swim, or use a hot tub until your health care provider approves. °· Check your insertion site every day for redness, swelling, or drainage. °· Do not apply powder or lotion to the site. °· Do not lift over 10 lb (4.5 kg) for 5 days after your procedure or as directed by your health care provider. °· Ask your health care provider when it is okay to: °¨ Return to work or school. °¨ Resume usual physical activities or sports. °¨ Resume sexual activity. °· Do not drive home if you are discharged the same day as the procedure. Have someone else drive you. °· You may drive 24 hours after the procedure unless otherwise instructed by your health care provider. °· Do not operate machinery or power tools for 24 hours after the procedure or as directed by your health care provider. °· If your procedure was done as an  outpatient procedure, which means that you went home the same day as your procedure, a responsible adult should be with you for the first 24 hours after you arrive home. °· Keep all follow-up visits as directed by your health care provider. This is important. °SEEK MEDICAL CARE IF: °· You have a fever. °· You have chills. °· You have increased bleeding from the catheter insertion site. Hold pressure on the site. °SEEK IMMEDIATE MEDICAL CARE IF: °· You have unusual pain at the catheter insertion site. °· You have redness, warmth, or swelling at the catheter insertion site. °· You have drainage (other than a small amount of blood on the dressing) from the catheter insertion site. °· The catheter insertion site is bleeding, and the bleeding does not stop after 30 minutes of holding steady pressure on the site. °· The area near or just beyond the catheter insertion site becomes pale, cool, tingly, or numb. °  °This information is not intended to replace advice given to you by your health care provider. Make sure you discuss any questions you have with your health care provider. °  °Document Released: 09/24/2004 Document Revised: 03/29/2014 Document Reviewed: 08/09/2012 °Elsevier Interactive Patient Education ©2016 Elsevier Inc. ° °

## 2015-02-20 NOTE — Sedation Documentation (Addendum)
Patient is resting comfortably. Denies any pain. 

## 2015-02-20 NOTE — Procedures (Signed)
S/P rt vert abd bilateral common carotid arteriograms. RT CFA approach. Findings. 1Significantly decrease in size of RT ICA  Intracranial aneurysms at 3 month follow up after pipeline flow diverter placement

## 2015-02-20 NOTE — H&P (Signed)
Chief Complaint: "I'm here for another angiogram"  HPI: Sharon Mendoza is an 44 y.o. female here for a 3 month follow up angiogram. She underwent successful pipeline flow diverting stent treatment of 2 right intracranial aneurysms with good result. She has been doing well. No symptoms of any sort, though thinks her visual acuity may be changing, but not sure that's related to the procedure. Otherwise, feeling well, no recent illness. Has been NPO this am.  Past Medical History:  Past Medical History  Diagnosis Date  . Hypertension   . Mitral valve regurgitation   . Gastroesophageal reflux   . Abnormal pap   . Chest pain radiating to arm   . PONV (postoperative nausea and vomiting)   . Hyperlipidemia   . Cerebral aneurysm without rupture   . Family history of heart disease     Past Surgical History:  Past Surgical History  Procedure Laterality Date  . Cesarean section  09/2005  . Myomectomy  2005  . Cardiovascular stress test  05/19/11    05/19/11 Nuclear stress test ([facility]): Normal study, EF 87%  . Ct coronary ca scoring  06/23/12    06/23/12 Cardiac CTA ([facility]): Ca score 0. Right dominant. Normal coronaries.  . US echocardiography  05/18/11    05/18/11 Echo ([facility]): NL chamber size, LV systolic function, wall motion. LVEF 77%. Mild LVH. Mild MR/TR. NL PAP.  [facility] with anesthesia N/A 11/21/2014    Procedure: Embolization;  Surgeon: Luanne Bras, MD;  Location: [facility];  Service: Radiology;  Laterality: N/A;  . Intracranial aneurysm repair      Family History:  Family History  Problem Relation Age of Onset  . Hypertension Mother   . Liver disease Mother   . Cancer Father     colon  . Heart disease Brother   . Aneurysm Brother   . Heart attack Brother   . Asthma Son   . Aneurysm Sister   . Aneurysm Brother   . Aneurysm Sister   . Heart attack Sister   . Aneurysm Sister     Social History:  reports that she has never smoked. She  has never used smokeless tobacco. She reports that she does not drink alcohol or use illicit drugs.  Allergies: No Known Allergies  Medications:   Medication List    ASK your doctor about these medications        aspirin 81 MG tablet  Take 81 mg by mouth daily.     cetirizine 10 MG tablet  Commonly known as:  ZYRTEC  Take 10 mg by mouth daily.     clopidogrel 75 MG tablet  Commonly known as:  PLAVIX  Take 37.5 mg by mouth every other day.     enalapril 5 MG tablet  Commonly known as:  VASOTEC  Take 1 tablet (5 mg total) by mouth daily.     nebivolol 5 MG tablet  Commonly known as:  BYSTOLIC  Take 1 tablet (5 mg total) by mouth daily.     norethindrone 0.35 MG tablet  Commonly known as:  MICRONOR,CAMILA,ERRIN  Take 1 tablet (0.35 mg total) by mouth daily.     pantoprazole 40 MG tablet  Commonly known as:  PROTONIX  Take 1 tablet (40 mg total) by mouth daily.     Vitamin D (Cholecalciferol) 1000 UNITS Tabs  Take 2,000 Units by mouth daily.        Please HPI for pertinent positives, otherwise complete 10 system ROS negative.  Physical Exam: BP 121/89 mmHg  Pulse 68  Temp(Src) 98 F (36.7 C) (Oral)  Resp 18  Ht 5\' 3"  (1.6 m)  Wt 125 lb (56.7 kg)  BMI 22.15 kg/m2  SpO2 100%  LMP  (LMP Unknown) Body mass index is 22.15 kg/(m^2).   General Appearance:  Alert, cooperative, no distress, appears stated age  Head:  Normocephalic, without obvious abnormality, atraumatic  ENT: Unremarkable  Neck: Supple, symmetrical, trachea midline  Lungs:   Clear to auscultation bilaterally, no w/r/r, respirations unlabored without use of accessory muscles.  Chest Wall:  No tenderness or deformity  Heart:  Regular rate and rhythm, S1, S2 normal, no murmur, rub or gallop.  Extremities: Extremities normal, atraumatic, no cyanosis or edema  Pulses: 2+ and symmetric femoral  Neurologic: Normal affect, no gross deficits.    Labs: Results for orders placed or performed in visit  on 02/18/15 (from the past 48 hour(s))  Hepatic function panel     Status: None   Collection Time: 02/18/15 10:14 AM  Result Value Ref Range   Total Bilirubin 0.4 0.2 - 1.2 mg/dL   Bilirubin, Direct 0.1 <=0.2 mg/dL   Indirect Bilirubin 0.3 0.2 - 1.2 mg/dL   Alkaline Phosphatase 59 33 - 115 U/L   AST 16 10 - 35 U/L   ALT 8 6 - 29 U/L   Total Protein 6.8 6.1 - 8.1 g/dL   Albumin 4.3 3.6 - 5.1 g/dL  Lipid panel     Status: None   Collection Time: 02/18/15 10:14 AM  Result Value Ref Range   Cholesterol 190 125 - 200 mg/dL   Triglycerides 65 <150 mg/dL   HDL 70 >=46 mg/dL   Total CHOL/HDL Ratio 2.7 <=5.0 Ratio   VLDL 13 <30 mg/dL   LDL Cholesterol 107 <130 mg/dL    Comment:   Total Cholesterol/HDL Ratio:CHD Risk                        Coronary Heart Disease Risk Table                                        Men       Women          1/2 Average Risk              3.4        3.3              Average Risk              5.0        4.4           2X Average Risk              9.6        7.1           3X Average Risk             23.4       11.0 Use the calculated Patient Ratio above and the CHD Risk table  to determine the patient's CHD Risk.     Assessment/Plan Recent endovascular treatment of intracranial aneurysms For cerebral arteriogram today Labs pending Risks and Benefits discussed with the patient including, but not limited to bleeding, infection, vascular injury or contrast induced renal failure. All of the patient's questions were answered, patient is agreeable  to proceed. Consent signed and in chart.   Ascencion Dike PA-C 02/20/2015, 7:55 AM

## 2015-02-28 ENCOUNTER — Telehealth: Payer: Self-pay | Admitting: Obstetrics and Gynecology

## 2015-02-28 NOTE — Telephone Encounter (Signed)
I left patient a message to call and reschedule her 3 month follow up appointment 03/05/15. (due to surgery)

## 2015-03-03 ENCOUNTER — Other Ambulatory Visit: Payer: Self-pay | Admitting: *Deleted

## 2015-03-03 DIAGNOSIS — I10 Essential (primary) hypertension: Secondary | ICD-10-CM

## 2015-03-03 MED ORDER — ENALAPRIL MALEATE 5 MG PO TABS: 5.0000 mg | ORAL_TABLET | Freq: Every day | ORAL | Status: DC

## 2015-03-05 ENCOUNTER — Ambulatory Visit: Payer: 59 | Admitting: Obstetrics and Gynecology

## 2015-03-06 NOTE — Telephone Encounter (Signed)
I left patient another message to call and reschedule her 3 month follow up.

## 2015-03-12 ENCOUNTER — Telehealth (HOSPITAL_COMMUNITY): Payer: Self-pay

## 2015-03-12 NOTE — Telephone Encounter (Signed)
Encounter complete. 

## 2015-03-18 ENCOUNTER — Inpatient Hospital Stay (HOSPITAL_COMMUNITY): Admission: RE | Admit: 2015-03-18 | Payer: 59 | Source: Ambulatory Visit

## 2015-03-20 ENCOUNTER — Ambulatory Visit (INDEPENDENT_AMBULATORY_CARE_PROVIDER_SITE_OTHER): Payer: 59 | Admitting: Pharmacist Clinician (PhC)/ Clinical Pharmacy Specialist

## 2015-03-20 ENCOUNTER — Encounter: Payer: Self-pay | Admitting: Pharmacist Clinician (PhC)/ Clinical Pharmacy Specialist

## 2015-03-20 VITALS — BP 110/72 | HR 80 | Ht 63.0 in | Wt 125.7 lb

## 2015-03-20 DIAGNOSIS — I10 Essential (primary) hypertension: Secondary | ICD-10-CM | POA: Diagnosis not present

## 2015-03-20 NOTE — Assessment & Plan Note (Signed)
Her BP has stabilized quite well since the addition of Bystolic 5 mg.  She takes both medications each morning and reports no side effects or other concerns.  She will continue with the current regimen and continue to monitor her home BP readings on a regular basis.  She is to call the office if she sees readings trend to higher than [id number] systolic.  Praised her on her dietary changes, as her LDL cholesterol dropped from 144 to 107 and triglycerides from 164 to 65, without any cholesterol medications.

## 2015-03-20 NOTE — Progress Notes (Signed)
     03/20/2015 Sharon Mendoza 03-08-1971 [id number]   HPI:  Sharon Mendoza is a 44 y.o. female patient of Dr Gwenlyn Found with a PMH below who presents today for hypertension clinic evaluation.  In September she underwent surgery to to have a carotid artery pipeline stent placed.  This was done without incident and she was released the following day.  There is a strong family history of this, as 68 of her 16 siblings have had cerebral aneurysms, one of whom died.  Her blood pressure readings at home were somewhat erratic while she was taking enalapril 5 mg daily.  She saw Dr. Gwenlyn Found in November and he added Bystolic 5 mg.  Since then she reports all home readings WNL, the highest being [id number] systolic.  She also notes her heart rate has decreased some and is now stable between 60-80.    Cardiac Hx: hypertension, hyperlipidemia, atypical chest pain  Family Hx: mother's family significant for cerebral aneurysms, 1 sibling deceased from aneurysm, 1 brother died from MI at 37, another sister with MI at 3 still living. (she is 47 of 76 siblings)  Social Hx: no tobacco or alcohol, drinks 2 cups of coffee/day  Diet: has worked hard to improve diet since aneurysm surgery;  No more fried foods, cut way back on sodium and dairy, eating more oatmeal and vegetables  Exercise: recently joined [facility] and hopes to start the new year with an exercise routine  Home BP readings: all WNL, highest systolic reading at Q000111Q   Current antihypertensive medications: Bystolic 5 mg qd, enalapril 5 mg qd  Wt Readings from Last 3 Encounters:  03/20/15 125 lb 11.2 oz (57.017 kg)  02/20/15 125 lb (56.7 kg)  02/18/15 125 lb (56.7 kg)   BP Readings from Last 3 Encounters:  03/20/15 110/72  02/20/15 101/69  02/18/15 120/84   Pulse Readings from Last 3 Encounters:  03/20/15 80  02/20/15 68  02/18/15 73    Current Outpatient Prescriptions  Medication Sig Dispense Refill  . aspirin 81 MG tablet Take 81 mg by mouth daily.     . cetirizine (ZYRTEC) 10 MG tablet Take 10 mg by mouth daily.    . clopidogrel (PLAVIX) 75 MG tablet Take 37.5 mg by mouth every other day.     . enalapril (VASOTEC) 5 MG tablet Take 1 tablet (5 mg total) by mouth daily. 30 tablet 5  . nebivolol (BYSTOLIC) 5 MG tablet Take 1 tablet (5 mg total) by mouth daily. 30 tablet 3  . norethindrone (MICRONOR,CAMILA,ERRIN) 0.35 MG tablet Take 1 tablet (0.35 mg total) by mouth daily. 3 Package 3  . pantoprazole (PROTONIX) 40 MG tablet Take 1 tablet (40 mg total) by mouth daily. 30 tablet 1  . Vitamin D, Cholecalciferol, 1000 UNITS TABS Take 2,000 Units by mouth daily.     No current facility-administered medications for this visit.    No Known Allergies  Past Medical History  Diagnosis Date  . Hypertension   . Mitral valve regurgitation   . Gastroesophageal reflux   . Abnormal pap   . Chest pain radiating to arm   . PONV (postoperative nausea and vomiting)   . Hyperlipidemia   . Cerebral aneurysm without rupture   . Family history of heart disease     Blood pressure 110/72, pulse 80, height 5\' 3"  (1.6 m), weight 125 lb 11.2 oz (57.017 kg).    Tommy Medal PharmD CPP [facility]

## 2015-03-20 NOTE — Patient Instructions (Signed)
Your blood pressure today is 110/72  Check your blood pressure at home several times each week and keep record of the readings.  Take your BP meds as follows: continue with enalapril and Bystolic  Bring all of your meds, your BP cuff and your record of home blood pressures to your next appointment.  Exercise as you're able, try to walk approximately 30 minutes per day.  Keep salt intake to a minimum, especially watch canned and prepared boxed foods.  Eat more fresh fruits and vegetables and fewer canned items.  Avoid eating in fast food restaurants.    HOW TO TAKE YOUR BLOOD PRESSURE: . Rest 5 minutes before taking your blood pressure. .  Don't smoke or drink caffeinated beverages for at least 30 minutes before. . Take your blood pressure before (not after) you eat. . Sit comfortably with your back supported and both feet on the floor (don't cross your legs). . Elevate your arm to heart level on a table or a desk. . Use the proper sized cuff. It should fit smoothly and snugly around your bare upper arm. There should be enough room to slip a fingertip under the cuff. The bottom edge of the cuff should be 1 inch above the crease of the elbow. . Ideally, take 3 measurements at one sitting and record the average.

## 2015-04-01 ENCOUNTER — Telehealth (HOSPITAL_COMMUNITY): Payer: Self-pay

## 2015-04-01 NOTE — Telephone Encounter (Signed)
I did let pt know that we would cancel her test. I did council the pt and encouraged her to seek medical attention if she starts having further problems. Pt also encouraged to call beack to the office to reschedule if she should change her mind.

## 2015-04-03 ENCOUNTER — Encounter (HOSPITAL_COMMUNITY): Payer: 59

## 2015-04-09 ENCOUNTER — Encounter (INDEPENDENT_AMBULATORY_CARE_PROVIDER_SITE_OTHER): Payer: Self-pay | Admitting: Obstetrics and Gynecology

## 2015-04-17 ENCOUNTER — Telehealth: Payer: Self-pay

## 2015-04-17 NOTE — Telephone Encounter (Signed)
Patient called in about cost of Bystolic. She would like a call back to discuss this.

## 2015-04-23 NOTE — Progress Notes (Signed)
This encounter was created in error - please disregard.

## 2015-04-24 ENCOUNTER — Ambulatory Visit: Payer: Self-pay | Admitting: Obstetrics and Gynecology

## 2015-04-28 NOTE — Telephone Encounter (Signed)
Left message to call back  Routed to PharmD to review medication options.

## 2015-05-08 ENCOUNTER — Encounter: Payer: Self-pay | Admitting: Obstetrics and Gynecology

## 2015-05-08 ENCOUNTER — Ambulatory Visit (INDEPENDENT_AMBULATORY_CARE_PROVIDER_SITE_OTHER): Payer: BLUE CROSS/BLUE SHIELD | Admitting: Obstetrics and Gynecology

## 2015-05-08 VITALS — BP 110/70 | HR 80 | Resp 14 | Wt 125.0 lb

## 2015-05-08 DIAGNOSIS — N930 Postcoital and contact bleeding: Secondary | ICD-10-CM | POA: Diagnosis not present

## 2015-05-08 DIAGNOSIS — Z3041 Encounter for surveillance of contraceptive pills: Secondary | ICD-10-CM

## 2015-05-08 MED ORDER — NORETHINDRONE 0.35 MG PO TABS: 1.0000 | ORAL_TABLET | Freq: Every day | ORAL | Status: DC

## 2015-05-08 NOTE — Progress Notes (Signed)
Patient ID: Sharon Mendoza, female   DOB: [birth year], 45 y.o.   MRN: [id number] GYNECOLOGY  VISIT   HPI: 45 y.o.   Single  African American  female   G1P1 with Patient's last menstrual period was 04/29/2015.   here to follow up on her POP. The patient has a h/o a fibroid uterus as well as an aneurysm (s/p stent placement and on Plavix). At her visit in the fall, I changed her from combination OCP's to micronor. She has been on POP since September, she just had her first cycle earlier this month. She bleed x 3 days, light. No cramping. Tolerating it fine, doing well. She was on OCP's initially for heavy, crampy cycles. She is not currently sexually active, she has always had bleeding with intercourse. She is considering being sexually active with a new partner and is worried about bleeding. No h/o cervical dysplasia, last pap in 2014. She was noted to have a friable cervix at the time of her annual exam. She states at her last pap, Dr Charlies Constable had to use silver nitrate to stop the bleeding after her pap.  GYNECOLOGIC HISTORY: Patient's last menstrual period was 04/29/2015. Contraception:OCP Menopausal hormone therapy: None        OB History    Gravida Para Term Preterm AB TAB SAB Ectopic Multiple Living   1 1        1          Patient Active Problem List   Diagnosis Date Noted  . Essential hypertension 02/18/2015  . Hyperlipidemia 02/18/2015  . Chest pain 02/18/2015  . Brain aneurysm 11/21/2014  . Pain in joint, shoulder region 12/10/2013  . LBP radiating to left leg 12/10/2013  . Aneurysm, cerebral, nonruptured 12/10/2013  . Fibroid uterus 06/22/2012  . Menorrhagia 06/22/2012  . Dysmenorrhea 06/22/2012    Past Medical History  Diagnosis Date  . Hypertension   . Mitral valve regurgitation   . Gastroesophageal reflux   . Abnormal pap   . Chest pain radiating to arm   . PONV (postoperative nausea and vomiting)   . Hyperlipidemia   . Cerebral aneurysm without rupture   . Family  history of heart disease     Past Surgical History  Procedure Laterality Date  . Cesarean section  09/2005  . Myomectomy  2005  . Cardiovascular stress test  05/19/11    05/19/11 Nuclear stress test ([facility]): Normal study, EF 87%  . Ct coronary ca scoring  06/23/12    06/23/12 Cardiac CTA ([facility]): Ca score 0. Right dominant. Normal coronaries.  . US echocardiography  05/18/11    05/18/11 Echo ([facility]): NL chamber size, LV systolic function, wall motion. LVEF 77%. Mild LVH. Mild MR/TR. NL PAP.  [facility] Radiology with anesthesia N/A 11/21/2014    Procedure: Embolization;  Surgeon: Luanne Bras, MD;  Location: [facility];  Service: Radiology;  Laterality: N/A;  . Intracranial aneurysm repair      Current Outpatient Prescriptions  Medication Sig Dispense Refill  . aspirin 81 MG tablet Take 81 mg by mouth daily.    . cetirizine (ZYRTEC) 10 MG tablet Take 10 mg by mouth daily.    . clopidogrel (PLAVIX) 75 MG tablet Take 37.5 mg by mouth every other day.     . enalapril (VASOTEC) 5 MG tablet Take 1 tablet (5 mg total) by mouth daily. 30 tablet 5  . nebivolol (BYSTOLIC) 5 MG tablet Take 1 tablet (5 mg total) by mouth daily. 30 tablet 3  .  norethindrone (MICRONOR,CAMILA,ERRIN) 0.35 MG tablet Take 1 tablet (0.35 mg total) by mouth daily. 3 Package 2  . pantoprazole (PROTONIX) 40 MG tablet Take 1 tablet (40 mg total) by mouth daily. 30 tablet 1  . Vitamin D, Cholecalciferol, 1000 UNITS TABS Take 2,000 Units by mouth daily.     No current facility-administered medications for this visit.     ALLERGIES: Review of patient's allergies indicates no known allergies.  Family History  Problem Relation Age of Onset  . Hypertension Mother   . Liver disease Mother   . Cancer Father     colon  . Heart disease Brother   . Aneurysm Brother   . Heart attack Brother   . Asthma Son   . Aneurysm Sister   . Aneurysm Brother   . Aneurysm Sister   . Heart attack Sister   . Aneurysm  Sister     Social History   Social History  . Marital Status: Single    Spouse Name: N/A  . Number of Children: 1  . 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 Smoking status: Never Smoker   . Smokeless tobacco: Never Used  . Alcohol Use: No  . Drug Use: No  . Sexual Activity:    Partners: Male    Birth Control/ Protection: Pill     Comment: Loestrin   Other Topics Concern  . Not on file   Social History Narrative    Review of Systems  Constitutional: Negative.   HENT: Negative.   Eyes: Negative.   Respiratory: Negative.   Cardiovascular: Negative.   Gastrointestinal: Negative.   Genitourinary:       Unscheduled menstrual bleeding  Musculoskeletal: Negative.   Skin: Negative.   Neurological: Negative.   Endo/Heme/Allergies: Negative.   Psychiatric/Behavioral: Negative.     PHYSICAL EXAMINATION:    BP 110/70 mmHg  Pulse 80  Resp 14  Wt 125 lb (56.7 kg)  LMP 04/29/2015    General appearance: alert, cooperative and appears stated age   ASSESSMENT Contraception management/cycle control. She is doing well on the micronor. She just had her first cycle and it was light and short Long term issue with bleeding with intercourse. She is considering being sexually active with a new partner. No h/o cervical dysplasia. Not likely infection in that it has always been there.    PLAN Continue on the minipill If she becomes sexually active again, she will use condoms If she has bleeding with intercourse, she will come in for evaluation We discussed that if she has a negative evaluation, there are options for treatment, including electrocautery, a shallow leep could be done if needed.    An After Visit Summary was printed and given to the patient.

## 2015-05-13 ENCOUNTER — Encounter: Payer: Self-pay | Admitting: Cardiovascular Disease

## 2015-05-13 ENCOUNTER — Ambulatory Visit (INDEPENDENT_AMBULATORY_CARE_PROVIDER_SITE_OTHER): Payer: BLUE CROSS/BLUE SHIELD | Admitting: Cardiovascular Disease

## 2015-05-13 VITALS — BP 102/60 | HR 80 | Ht 63.0 in | Wt 122.4 lb

## 2015-05-13 DIAGNOSIS — Z79899 Other long term (current) drug therapy: Secondary | ICD-10-CM | POA: Insufficient documentation

## 2015-05-13 DIAGNOSIS — R079 Chest pain, unspecified: Secondary | ICD-10-CM

## 2015-05-13 NOTE — Assessment & Plan Note (Signed)
I saw Sharon Mendoza 02/18/15 for atypical chest pain. I'll order a stress test which she never decided to pursue and ultimately her pain resolved spontaneously. I didn't think her pain was atypical and suspect it was not cardiac.

## 2015-05-13 NOTE — Patient Instructions (Signed)
Medication Instructions:  Your physician recommends that you continue on your current medications as directed. Please refer to the Current Medication list given to you today.   Labwork: none  Testing/Procedures: none  Follow-Up: Follow up with Dr. Berry as needed.   Any Other Special Instructions Will Be Listed Below (If Applicable).     If you need a refill on your cardiac medications before your next appointment, please call your pharmacy.   

## 2015-05-13 NOTE — Progress Notes (Signed)
Sharon Mendoza returns today for follow-up. I last saw her 02/18/15 for atypical chest pain. She did have positive risk factors including hypertension, hyperlipidemia and family history. I had planned on performing an exercise stress test which she decided not to pursue this ultimately her pain resolved spontaneously. I suspect her pain was noncardiac. I will see her back as needed.

## 2015-05-21 ENCOUNTER — Ambulatory Visit: Payer: Self-pay

## 2015-05-27 ENCOUNTER — Telehealth (HOSPITAL_COMMUNITY): Payer: Self-pay

## 2015-05-27 NOTE — Telephone Encounter (Signed)
Called to schedule a f/u MRI with Dr. Estanislado Pandy, left a VM for pt to return call. AW

## 2015-05-28 ENCOUNTER — Other Ambulatory Visit (HOSPITAL_COMMUNITY): Payer: Self-pay | Admitting: Interventional Radiology

## 2015-05-28 DIAGNOSIS — I671 Cerebral aneurysm, nonruptured: Secondary | ICD-10-CM

## 2015-05-30 ENCOUNTER — Ambulatory Visit
Admission: RE | Admit: 2015-05-30 | Discharge: 2015-05-30 | Disposition: A | Discharge status: Home / Self Care | Payer: BLUE CROSS/BLUE SHIELD | Source: Ambulatory Visit | Attending: Obstetrics and Gynecology | Admitting: Obstetrics and Gynecology

## 2015-05-30 DIAGNOSIS — Z1239 Encounter for other screening for malignant neoplasm of breast: Secondary | ICD-10-CM

## 2015-06-04 ENCOUNTER — Other Ambulatory Visit: Payer: Self-pay | Admitting: Radiology

## 2015-06-04 ENCOUNTER — Other Ambulatory Visit: Payer: Self-pay | Admitting: Obstetrics and Gynecology

## 2015-06-04 DIAGNOSIS — R928 Other abnormal and inconclusive findings on diagnostic imaging of breast: Secondary | ICD-10-CM

## 2015-06-06 ENCOUNTER — Telehealth: Payer: Self-pay | Admitting: Radiology

## 2015-06-06 NOTE — Progress Notes (Signed)
  R ICA aneurysms Pipeline stent placed 11/21/2014  Request for refill of Plavix  Pt taking 37.5 Plavix every other day  Approved with Dr Estanislado Pandy  Faxed to [facility] [address] [place] , [place]

## 2015-06-10 ENCOUNTER — Ambulatory Visit (HOSPITAL_COMMUNITY)
Admission: RE | Admit: 2015-06-10 | Discharge: 2015-06-10 | Disposition: A | Discharge status: Home / Self Care | Payer: BLUE CROSS/BLUE SHIELD | Source: Ambulatory Visit | Attending: Interventional Radiology | Admitting: Interventional Radiology

## 2015-06-10 DIAGNOSIS — I671 Cerebral aneurysm, nonruptured: Secondary | ICD-10-CM

## 2015-06-10 LAB — CREATININE, SERUM
CREATININE: 0.85 mg/dL (ref 0.44–1.00)
GFR calc non Af Amer: >60 mL/min (ref >60)

## 2015-06-10 MED ORDER — GADOBENATE DIMEGLUMINE 529 MG/ML IV SOLN
10.0000 mL | Freq: Once | INTRAVENOUS | Status: AC | PRN
Start: 2015-06-10 — End: 2015-06-10
  Administered 2015-06-10: 10 mL via INTRAVENOUS

## 2015-06-17 ENCOUNTER — Telehealth (HOSPITAL_COMMUNITY): Payer: Self-pay

## 2015-06-17 NOTE — Telephone Encounter (Signed)
Pt agreed to f/u in 6 months with a MRI per Dr. Estanislado Pandy. AW

## 2015-06-19 ENCOUNTER — Ambulatory Visit
Admission: RE | Admit: 2015-06-19 | Discharge: 2015-06-19 | Disposition: A | Discharge status: Home / Self Care | Payer: BLUE CROSS/BLUE SHIELD | Source: Ambulatory Visit | Attending: Obstetrics and Gynecology | Admitting: Obstetrics and Gynecology

## 2015-06-19 DIAGNOSIS — R928 Other abnormal and inconclusive findings on diagnostic imaging of breast: Secondary | ICD-10-CM

## 2015-07-17 ENCOUNTER — Telehealth: Payer: Self-pay | Admitting: Obstetrics and Gynecology

## 2015-07-17 NOTE — Telephone Encounter (Signed)
Left message for patient to let her know her tdap record is ready to be picked up per her request.

## 2015-07-21 ENCOUNTER — Other Ambulatory Visit: Payer: Self-pay | Admitting: Cardiovascular Disease

## 2015-07-21 NOTE — Telephone Encounter (Signed)
Rx(s) sent to pharmacy electronically.  

## 2015-07-22 ENCOUNTER — Telehealth: Payer: Self-pay | Admitting: *Deleted

## 2015-07-22 ENCOUNTER — Telehealth: Payer: Self-pay | Admitting: Cardiovascular Disease

## 2015-07-22 DIAGNOSIS — R0789 Other chest pain: Secondary | ICD-10-CM

## 2015-07-22 DIAGNOSIS — I158 Other secondary hypertension: Secondary | ICD-10-CM

## 2015-07-22 DIAGNOSIS — E785 Hyperlipidemia, unspecified: Secondary | ICD-10-CM

## 2015-07-22 NOTE — Telephone Encounter (Signed)
Stress test order still in system. Ordered repeat lipid and liver.  Called pt & left msg indicating scheduler will call to set up test.

## 2015-07-22 NOTE — Telephone Encounter (Signed)
Pt calling requesting Cholesterol check and Stress test, she said Stress was ordered in December but she backed out-wants to get a new order --

## 2015-07-22 NOTE — Telephone Encounter (Signed)
Medication Detail      Disp Refills Start End     BYSTOLIC 5 MG tablet 30 tablet 1 07/21/2015     Sig: TAKE 1 TABLET (5 MG TOTAL) BY MOUTH DAILY.    Notes to Pharmacy: Please defer future refills to PCP as patient is to follow up as needed with Dr. Gwenlyn Found. Thanks    E-Prescribing Status: Receipt confirmed by pharmacy (07/21/2015 2:39 PM EDT)    Med refilled 07/21/15 to [employer]

## 2015-07-22 NOTE — Telephone Encounter (Signed)
Per message left by the patient on the refill voicemail this AM, [facility] does not have the rx for bystolic.

## 2015-07-22 NOTE — Telephone Encounter (Signed)
LM for patient that med was refilled.

## 2015-07-22 NOTE — Telephone Encounter (Signed)
[facility] to confirm that Rx was received. They have Rx and it is ready for pick up.

## 2015-07-23 ENCOUNTER — Telehealth: Payer: Self-pay | Admitting: Cardiovascular Disease

## 2015-07-23 NOTE — Telephone Encounter (Signed)
New message    The pt is calling about the medication called into [facility] on 07/22/15 but the pt wants to know why the insurance did not pay for the medications. It is regarding the patient is stated she feels like she should not have to pay for the medication in full since the insurance is suppose to pay a portion.

## 2015-07-23 NOTE — Telephone Encounter (Signed)
Will forward to pharm md, ? If bystolic can be changed to different medication

## 2015-07-24 NOTE — Telephone Encounter (Signed)
Returned call to pt. She says the pharmacy did not process through the insurance. She said insurance now requires PA for medication which has been faxed to our office per the pharmacy. She also has the copay card to reduce the price. She states she is able to afford it as long as the insurance pays a portion. She is completely out of the medication. Will provide samples to get her through until PA is approved. Instructed her to come to get them at our office.

## 2015-07-28 NOTE — Telephone Encounter (Signed)
Pt PA submitted for Bystolic 5mg  QD. Will update once approval/denial received.

## 2015-07-29 ENCOUNTER — Other Ambulatory Visit: Payer: Self-pay | Admitting: *Deleted

## 2015-07-29 DIAGNOSIS — I158 Other secondary hypertension: Secondary | ICD-10-CM

## 2015-07-29 DIAGNOSIS — E785 Hyperlipidemia, unspecified: Secondary | ICD-10-CM

## 2015-08-12 NOTE — Telephone Encounter (Signed)
Pt calling back - received VM from caller. She is trying to get bystolic approved - this med still requires a prior [place]. Pt states cheapest currently is $128 w discount card. However, she states she will have new insurance in June/July? With [facility]. Wondering if PA is required or if she can get alternate medication.  Pt also needs to have an appt scheduled for stress test and wants to do a cholesterol recheck.  Routed to Dr. Kennon Holter nurse to address testing & to pharmD for recommendations on med.

## 2015-08-12 NOTE — Telephone Encounter (Signed)
Unfortunately we are unable to know if new insurance will require PA until pharmacy submits prescription to insurance. She should be able to call the new insurance and they can tell her if it will require PA. The last time I spoke to her she stated that the medication was affordable with the discount card. Has this changed? If possible we would like to keep her on this medication as it has done well for her blood pressure.

## 2015-08-12 NOTE — Telephone Encounter (Signed)
No answer. Left message to call back.   

## 2015-08-12 NOTE — Telephone Encounter (Signed)
Received call from pt. She is out of her bystolic.  Medication Samples have been provided to the patient.  Drug name: bystolic       Strength: 5mg         Qty: 3 boxes (21 pills)  LOT: [id number]  Exp.Date: 01/2017  The patient has been instructed regarding the correct time, dose, and frequency of taking this medication, including desired effects and most common side effects.   Roney Jaffe 3:57 PM 08/12/2015  Her new insurance starts on June 4th so then she will find out if her medication will be covered or if PA if needed. Currently, she cannot afford to pay $128 discounted price for bystolic. If new insurance will not cover bystolic then she may need new medication change. She will let us know. Until then, samples given to pt for her or family member to pick up. Pt aware someone will be contacting her to schedule ETT.

## 2015-08-21 ENCOUNTER — Other Ambulatory Visit: Payer: Self-pay

## 2015-08-21 DIAGNOSIS — I10 Essential (primary) hypertension: Secondary | ICD-10-CM

## 2015-08-21 MED ORDER — ENALAPRIL MALEATE 5 MG PO TABS: 5.0000 mg | ORAL_TABLET | Freq: Every day | ORAL | Status: DC

## 2015-08-26 ENCOUNTER — Other Ambulatory Visit: Payer: Self-pay

## 2015-08-26 NOTE — Telephone Encounter (Signed)
I called in 3 packs with 2 refills at her last visit.

## 2015-08-26 NOTE — Telephone Encounter (Signed)
Medication refill request: Norethindrone 0.35mg   Last AEX:  12/04/14 JJ; 05/08/15 office visit for oral contraceptive pills Next AEX: none scheduled Last MMG (if hormonal medication request): 05/30/15 BI-RADS CATEGORY 0: Incomplete. Need additional imaging evaluation and/or prior mammograms for comparison. 06/19/15 Calcifications in the right breast are likely benign based on morphology. Followup is recommended to document stability. Right diagnostic mammogram recommended in 6 months. BI-RADS CATEGORY 3: Probably benign Refill authorized: 05/08/15 #3packs w/2 refills; today #3packs w/2 refills? please advise

## 2015-08-27 ENCOUNTER — Other Ambulatory Visit: Payer: Self-pay

## 2015-08-27 MED ORDER — NEBIVOLOL HCL 5 MG PO TABS: ORAL_TABLET | ORAL | Status: DC

## 2015-08-28 NOTE — Telephone Encounter (Signed)
Left detailed message at number provided [phone], okay per ROI. Advised rx was sent as a 3 month supply with 2 refills at her visit on 05/08/2015. Advised she may contact her pharmacy directly to have her rx refills. Advised if she has any further questions to contact the office at [phone].  Routing to provider for final review. Patient agreeable to disposition. Will close encounter.

## 2015-10-15 ENCOUNTER — Telehealth: Payer: Self-pay | Admitting: *Deleted

## 2015-10-15 NOTE — Telephone Encounter (Addendum)
From: Verdene Rio Sent: 10/15/2015  10:24 AM To: Vanessa Ralphs, RN, Lorretta Harp, MD Subject: Unscheduled ETT                                Good morning Anderson Malta,  Several attempts have been made to contact this patient(5/9,5/24,5/25,7/5,7/26) to schedule the ETT Dr. Gwenlyn Found ordered for her back in May. I just wanted to inform you that I will be removing her from the workqueue. Have a great day!  Rollene Fare

## 2015-10-31 ENCOUNTER — Telehealth (HOSPITAL_COMMUNITY): Payer: Self-pay

## 2015-10-31 NOTE — Telephone Encounter (Signed)
Encounter complete. 

## 2015-11-04 ENCOUNTER — Ambulatory Visit (HOSPITAL_COMMUNITY)
Admission: RE | Admit: 2015-11-04 | Discharge: 2015-11-04 | Disposition: A | Discharge status: Home / Self Care | Payer: 59 | Source: Ambulatory Visit | Attending: Cardiovascular Disease | Admitting: Cardiovascular Disease

## 2015-11-04 DIAGNOSIS — E785 Hyperlipidemia, unspecified: Secondary | ICD-10-CM | POA: Diagnosis not present

## 2015-11-04 DIAGNOSIS — I158 Other secondary hypertension: Secondary | ICD-10-CM | POA: Diagnosis not present

## 2015-11-04 LAB — EXERCISE TOLERANCE TEST
CHL RATE OF PERCEIVED EXERTION: 17
CSEPEDS: 15 s
CSEPEW: 10.5 METS
CSEPHR: 94 %
CSEPPHR: 164 {beats}/min
Exercise duration (min): 9 min
MPHR: 174 {beats}/min
Rest HR: 77 {beats}/min

## 2015-11-14 ENCOUNTER — Other Ambulatory Visit: Payer: Self-pay

## 2015-11-14 MED ORDER — NEBIVOLOL HCL 5 MG PO TABS: ORAL_TABLET | ORAL | 7 refills | Status: DC

## 2015-12-11 ENCOUNTER — Telehealth: Payer: Self-pay | Admitting: Cardiovascular Disease

## 2015-12-11 ENCOUNTER — Other Ambulatory Visit: Payer: Self-pay | Admitting: *Deleted

## 2015-12-11 DIAGNOSIS — K219 Gastro-esophageal reflux disease without esophagitis: Secondary | ICD-10-CM

## 2015-12-11 DIAGNOSIS — Z79899 Other long term (current) drug therapy: Secondary | ICD-10-CM

## 2015-12-11 DIAGNOSIS — E785 Hyperlipidemia, unspecified: Secondary | ICD-10-CM

## 2015-12-11 NOTE — Telephone Encounter (Signed)
New message ° °Pt call requesting to speak with RN about test results. Please call back to discuss  ° ° °

## 2015-12-11 NOTE — Telephone Encounter (Signed)
Discussed w patient.  She sees Dr. Gwenlyn Found, not Dr. Stanford Breed. Notes no PCP, she initially had GI prescribing protonix, and this went to her gynecologist to handle. She has new gynecologist who does not wish to fill med for her.  Aware that she takes this for relief of GERD assoc chest discomfort, and she wonders if Dr. Gwenlyn Found is OK to fill this med. Informed her I would route for recommendation.

## 2015-12-11 NOTE — Telephone Encounter (Signed)
Returned call. Pt notes she went for fasting labs but system was down when she went. Wanted to see if OK to get these. Advised to do so, will reorder labwork in case old orders are expired/no longer viewable in [facility] system.

## 2015-12-11 NOTE — Telephone Encounter (Signed)
New Message   *STAT* If patient is at the pharmacy, call can be transferred to refill team.   1. Which medications need to be refilled? (please list name of each medication and dose if known) Protonix 40mg   2. Which pharmacy/location (including street and city if local pharmacy) is medication to be sent to? [facility] [place] [facility] [place]   3. Do they need a 30 day or 90 day supply? Per pt does not know..  Pt comments. Pt states Dr. Stanford Breed do not prescribe the medication but wants to know will he be able to refill.

## 2015-12-15 MED ORDER — PANTOPRAZOLE SODIUM 40 MG PO TBEC: 40.0000 mg | DELAYED_RELEASE_TABLET | Freq: Every day | ORAL | 1 refills | Status: DC

## 2015-12-15 NOTE — Telephone Encounter (Signed)
Refill sent. Pt notified.

## 2015-12-15 NOTE — Telephone Encounter (Signed)
  That is fine with  me to refill her Protonix. She needs to obtain a PCP.

## 2015-12-16 LAB — LIPID PANEL
CHOL/HDL RATIO: 2.5 ratio (ref <=5.0)
CHOLESTEROL: 155 mg/dL (ref 125–200)
HDL: 61 mg/dL (ref >=46)
LDL Cholesterol: 79 mg/dL (ref <130)
TRIGLYCERIDES: 74 mg/dL (ref <150)
VLDL: 15 mg/dL (ref <30)

## 2015-12-16 LAB — HEPATIC FUNCTION PANEL
ALBUMIN: 4 g/dL (ref 3.6–5.1)
ALT: 10 U/L (ref 6–29)
AST: 18 U/L (ref 10–35)
Alkaline Phosphatase: 47 U/L (ref 33–115)
BILIRUBIN DIRECT: 0.1 mg/dL (ref <=0.2)
Indirect Bilirubin: 0.3 mg/dL (ref 0.2–1.2)
TOTAL PROTEIN: 6.4 g/dL (ref 6.1–8.1)
Total Bilirubin: 0.4 mg/dL (ref 0.2–1.2)

## 2015-12-23 ENCOUNTER — Telehealth: Payer: Self-pay | Admitting: *Deleted

## 2015-12-23 NOTE — Telephone Encounter (Signed)
Left message to call regarding 04 recall. Patient is due for a right breast DX mammogram -eh

## 2015-12-30 ENCOUNTER — Telehealth (HOSPITAL_COMMUNITY): Payer: Self-pay

## 2015-12-30 ENCOUNTER — Other Ambulatory Visit (HOSPITAL_COMMUNITY): Payer: Self-pay | Admitting: Interventional Radiology

## 2015-12-30 DIAGNOSIS — I729 Aneurysm of unspecified site: Secondary | ICD-10-CM

## 2015-12-30 NOTE — Telephone Encounter (Signed)
Called to schedule f/u MRI, left message for pt to call back to schedule. AW

## 2016-01-06 ENCOUNTER — Telehealth (HOSPITAL_COMMUNITY): Payer: Self-pay

## 2016-01-06 NOTE — Telephone Encounter (Signed)
Left message for pt to come in at 1pm for MRI. AW

## 2016-01-07 ENCOUNTER — Ambulatory Visit (HOSPITAL_COMMUNITY)
Admission: RE | Admit: 2016-01-07 | Discharge: 2016-01-07 | Disposition: A | Discharge status: Home / Self Care | Payer: 59 | Source: Ambulatory Visit | Attending: Interventional Radiology | Admitting: Interventional Radiology

## 2016-01-07 ENCOUNTER — Ambulatory Visit (HOSPITAL_COMMUNITY): Admission: RE | Admit: 2016-01-07 | Payer: 59 | Source: Ambulatory Visit

## 2016-01-07 ENCOUNTER — Other Ambulatory Visit: Payer: Self-pay | Admitting: Physician Assistant

## 2016-01-07 DIAGNOSIS — R9089 Other abnormal findings on diagnostic imaging of central nervous system: Secondary | ICD-10-CM | POA: Insufficient documentation

## 2016-01-07 DIAGNOSIS — I729 Aneurysm of unspecified site: Secondary | ICD-10-CM

## 2016-01-07 LAB — POCT I-STAT CREATININE: CREATININE: 0.8 mg/dL (ref 0.44–1.00)

## 2016-01-07 LAB — PLATELET INHIBITION P2Y12: PLATELET FUNCTION P2Y12: 182 [PRU] — AB (ref 194–418)

## 2016-01-07 MED ORDER — GADOBENATE DIMEGLUMINE 529 MG/ML IV SOLN
10.0000 mL | Freq: Once | INTRAVENOUS | Status: AC
  Administered 2016-01-07: 10 mL via INTRAVENOUS

## 2016-01-09 ENCOUNTER — Telehealth (HOSPITAL_COMMUNITY): Payer: Self-pay

## 2016-01-09 NOTE — Telephone Encounter (Signed)
Pt agreed to f/u in 6 months with mri. AW 

## 2016-01-13 ENCOUNTER — Other Ambulatory Visit: Payer: Self-pay | Admitting: Obstetrics and Gynecology

## 2016-01-13 DIAGNOSIS — R921 Mammographic calcification found on diagnostic imaging of breast: Secondary | ICD-10-CM

## 2016-01-13 NOTE — Telephone Encounter (Signed)
Spoke with patient- she is scheduled for 01-30-16 for her follow up Right breast U/S -eh

## 2016-01-30 ENCOUNTER — Ambulatory Visit
Admission: RE | Admit: 2016-01-30 | Discharge: 2016-01-30 | Disposition: A | Discharge status: Home / Self Care | Payer: 59 | Source: Ambulatory Visit | Attending: Obstetrics and Gynecology | Admitting: Obstetrics and Gynecology

## 2016-01-30 DIAGNOSIS — R921 Mammographic calcification found on diagnostic imaging of breast: Secondary | ICD-10-CM

## 2016-03-05 ENCOUNTER — Other Ambulatory Visit: Payer: Self-pay | Admitting: Cardiovascular Disease

## 2016-03-05 ENCOUNTER — Encounter: Payer: Self-pay | Admitting: Cardiovascular Disease

## 2016-03-05 ENCOUNTER — Other Ambulatory Visit: Payer: Self-pay | Admitting: *Deleted

## 2016-03-05 DIAGNOSIS — I10 Essential (primary) hypertension: Secondary | ICD-10-CM

## 2016-03-05 MED ORDER — ENALAPRIL MALEATE 5 MG PO TABS: 5.0000 mg | ORAL_TABLET | Freq: Every day | ORAL | 0 refills | Status: DC

## 2016-03-09 ENCOUNTER — Encounter: Payer: Self-pay | Admitting: Obstetrics and Gynecology

## 2016-03-09 ENCOUNTER — Ambulatory Visit (INDEPENDENT_AMBULATORY_CARE_PROVIDER_SITE_OTHER): Payer: 59 | Admitting: Obstetrics and Gynecology

## 2016-03-09 VITALS — BP 118/80 | HR 80 | Resp 15 | Ht 63.0 in | Wt 124.0 lb

## 2016-03-09 DIAGNOSIS — Z8632 Personal history of gestational diabetes: Secondary | ICD-10-CM | POA: Diagnosis not present

## 2016-03-09 DIAGNOSIS — Z01419 Encounter for gynecological examination (general) (routine) without abnormal findings: Secondary | ICD-10-CM

## 2016-03-09 DIAGNOSIS — Z124 Encounter for screening for malignant neoplasm of cervix: Secondary | ICD-10-CM

## 2016-03-09 DIAGNOSIS — Z0000 Encounter for general adult medical examination without abnormal findings: Secondary | ICD-10-CM | POA: Diagnosis not present

## 2016-03-09 DIAGNOSIS — D259 Leiomyoma of uterus, unspecified: Secondary | ICD-10-CM

## 2016-03-09 LAB — POCT URINALYSIS DIPSTICK
BILIRUBIN UA: NEGATIVE
Blood, UA: NEGATIVE
GLUCOSE UA: NEGATIVE
KETONES UA: NEGATIVE
Nitrite, UA: NEGATIVE
Protein, UA: NEGATIVE
Urobilinogen, UA: NEGATIVE
pH, UA: 6.5

## 2016-03-09 LAB — HEMOGLOBIN A1C
HEMOGLOBIN A1C: 5.3 % (ref <5.7)
Mean Plasma Glucose: 105 mg/dL

## 2016-03-09 MED ORDER — NORETHINDRONE 0.35 MG PO TABS: 1.0000 | ORAL_TABLET | Freq: Every day | ORAL | 3 refills | Status: DC

## 2016-03-09 NOTE — Progress Notes (Signed)
45 y.o. G1P1 SingleAfrican AmericanF here for annual exam.   The patient has a h/o a fibroid uterus as well as an aneurysm (s/p stent placement and on Plavix). On  micronor No bleeding on the micronor. No c/o.   No LMP recorded. Patient is not currently having periods (Reason: Oral contraceptives).          Sexually active: No.  The current method of family planning is OCP (estrogen/progesterone).    Exercising: No.  The patient does not participate in regular exercise at present. Smoker:  no  Health Maintenance: Pap:  06/22/12 Neg. HR HPV:Neg  History of abnormal Pap:  yes MMG:  01/30/16 Diagnostic Right BIRADS3:Probably Benign. Due for Screening 06/02/2016  Colonoscopy:  2010 Normal  BMD:   Never TDaP:  12/04/14    reports that she has never smoked. She has never used smokeless tobacco. She reports that she does not drink alcohol or use drugs. She has a 65 year old son, raising him on her own. 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 She is one of 14 children, very close with her siblings. Parents have passed.   Past Medical History:  Diagnosis Date  . Abnormal pap   . Cerebral aneurysm without rupture   . Chest pain radiating to arm   . Family history of heart disease   . Gastroesophageal reflux   . Gestational diabetes   . Hyperlipidemia   . Hypertension   . Mitral valve regurgitation   . PONV (postoperative nausea and vomiting)     Past Surgical History:  Procedure Laterality Date  . CARDIOVASCULAR STRESS TEST  05/19/11   05/19/11 Nuclear stress test ([facility]): Normal study, EF 87%  . CESAREAN SECTION  09/2005  . CT CORONARY CA SCORING  06/23/12   06/23/12 Cardiac CTA ([facility]): Ca score 0. Right dominant. Normal coronaries.  . INTRACRANIAL ANEURYSM REPAIR    . MYOMECTOMY  2005  . RADIOLOGY WITH ANESTHESIA N/A 11/21/2014   Procedure: Embolization;  Surgeon: Luanne Bras, MD;  Location: [place];  Service: Radiology;  Laterality:  N/A;  . US ECHOCARDIOGRAPHY  05/18/11   05/18/11 Echo ([place]): NL chamber size, LV systolic function, wall motion. LVEF 77%. Mild LVH. Mild MR/TR. NL PAP.    Current Outpatient Prescriptions  Medication Sig Dispense Refill  . aspirin 81 MG tablet Take 81 mg by mouth daily.    . cetirizine (ZYRTEC) 10 MG tablet Take 10 mg by mouth daily.    . clopidogrel (PLAVIX) 75 MG tablet Take 37.5 mg by mouth every other day.     . enalapril (VASOTEC) 5 MG tablet Take 1 tablet (5 mg total) by mouth daily. 90 tablet 0  . nebivolol (BYSTOLIC) 5 MG tablet TAKE 1 TABLET (5 MG TOTAL) BY MOUTH DAILY. 30 tablet 7  . norethindrone (MICRONOR,CAMILA,ERRIN) 0.35 MG tablet Take 1 tablet (0.35 mg total) by mouth daily. 3 Package 2  . pantoprazole (PROTONIX) 40 MG tablet Take 1 tablet (40 mg total) by mouth daily. Please establish care with a primary care physician for refills. 30 tablet 1  . Vitamin D, Cholecalciferol, 1000 UNITS TABS Take 2,000 Units by mouth daily.     No current facility-administered medications for this visit.     Family History  Problem Relation Age of Onset  . Hypertension Mother   . Liver disease Mother   . Cancer Father     colon  . Heart disease Brother   .  Aneurysm Brother   . Heart attack Brother   . Asthma Son   . Aneurysm Sister   . Aneurysm Brother   . Aneurysm Sister   . Heart attack Sister   . Aneurysm Sister     Review of Systems  Constitutional: Negative.   HENT: Negative.   Eyes: Negative.   Respiratory: Negative.   Cardiovascular: Negative.   Gastrointestinal: Negative.   Endocrine: Negative.   Genitourinary: Negative.   Musculoskeletal: Negative.   Skin: Negative.   Allergic/Immunologic: Negative.   Neurological: Negative.   Psychiatric/Behavioral: Negative.     Exam:   BP 118/80 (BP Location: Right Arm, Patient Position: Sitting, Cuff Size: Normal)   Pulse 80   Resp 15   Ht 5\' 3"  (1.6 m)   Wt 124 lb (56.2 kg)   BMI 21.97 kg/m   Weight  change: @WEIGHTCHANGE @ Height:   Height: 5\' 3"  (160 cm)  Ht Readings from Last 3 Encounters:  03/09/16 5\' 3"  (1.6 m)  05/13/15 5\' 3"  (1.6 m)  03/20/15 5\' 3"  (1.6 m)    General appearance: alert, cooperative and appears stated age Head: Normocephalic, without obvious abnormality, atraumatic Neck: no adenopathy, supple, symmetrical, trachea midline and thyroid normal to inspection and palpation Lungs: clear to auscultation bilaterally Cardiovascular: regular rate and rhythm Breasts: normal appearance, no masses or tenderness, mildly inverted nipples bilaterally (long term) Heart: regular rate and rhythm Abdomen: soft, non-tender; bowel sounds normal; no masses,  no organomegaly Extremities: extremities normal, atraumatic, no cyanosis or edema Skin: Skin color, texture, turgor normal. No rashes or lesions Lymph nodes: Cervical, supraclavicular, and axillary nodes normal. No abnormal inguinal nodes palpated Neurologic: Grossly normal   Pelvic: External genitalia:  no lesions              Urethra:  normal appearing urethra with no masses, tenderness or lesions              Bartholins and Skenes: normal                 Vagina: normal appearing vagina with normal color and discharge, no lesions              Cervix: no lesions               Bimanual Exam:  Uterus:  anteverted, mobile, not tender, irregularly shaped, 8 week sized              Adnexa: no mass, fullness, tenderness               Rectovaginal: Confirms               Anus:  normal sphincter tone, no lesions  Chaperone was present for exam.  A:  Well Woman with normal exam  Fibroid uterus, h/o heavy crampy cycles. No cycles with the micronor.   Gestational diabetes  P:   Pap with hpv  Continue micronor  Mammogram f/u in 3/17  Check urine dip  HgbA1C   Vit D  Discussed breast self exam  Discussed calcium and vit D intake

## 2016-03-09 NOTE — Patient Instructions (Signed)

## 2016-03-10 LAB — VITAMIN D 25 HYDROXY (VIT D DEFICIENCY, FRACTURES): Vit D, 25-Hydroxy: 60 ng/mL (ref 30–100)

## 2016-03-11 LAB — IPS PAP TEST WITH HPV

## 2016-04-15 ENCOUNTER — Other Ambulatory Visit: Payer: Self-pay

## 2016-04-15 NOTE — Telephone Encounter (Signed)
Error encounter. 

## 2016-08-13 ENCOUNTER — Telehealth (HOSPITAL_COMMUNITY): Payer: Self-pay

## 2016-08-13 NOTE — Telephone Encounter (Signed)
Called to schedule f/u mra, left message for pt to return call. AW

## 2016-08-17 ENCOUNTER — Other Ambulatory Visit (HOSPITAL_COMMUNITY): Payer: Self-pay | Admitting: Interventional Radiology

## 2016-08-17 DIAGNOSIS — I729 Aneurysm of unspecified site: Secondary | ICD-10-CM

## 2016-08-23 ENCOUNTER — Emergency Department (HOSPITAL_COMMUNITY)
Admission: EM | Admit: 2016-08-23 | Discharge: 2016-08-23 | Disposition: A | Discharge status: Home / Self Care | Payer: 59 | Attending: Emergency Medicine | Admitting: Emergency Medicine

## 2016-08-23 ENCOUNTER — Other Ambulatory Visit: Payer: Self-pay | Admitting: Obstetrics and Gynecology

## 2016-08-23 ENCOUNTER — Encounter (HOSPITAL_COMMUNITY): Payer: Self-pay | Admitting: Emergency Medicine

## 2016-08-23 ENCOUNTER — Telehealth: Payer: Self-pay | Admitting: *Deleted

## 2016-08-23 DIAGNOSIS — M79651 Pain in right thigh: Secondary | ICD-10-CM | POA: Diagnosis not present

## 2016-08-23 DIAGNOSIS — R921 Mammographic calcification found on diagnostic imaging of breast: Secondary | ICD-10-CM

## 2016-08-23 DIAGNOSIS — Z5321 Procedure and treatment not carried out due to patient leaving prior to being seen by health care provider: Secondary | ICD-10-CM | POA: Diagnosis not present

## 2016-08-23 NOTE — Telephone Encounter (Signed)
Spoke with patient regarding 04 recall. Patient will call and schedule - Patient also wanted Dr. Talbert Nan to know she stopped the OCP.

## 2016-08-23 NOTE — ED Triage Notes (Signed)
Pt st's she has been  Having pain in right leg x's 2 weeks.  St's pain started behind right knee and now has moved to right thigh area.  Pt st's when she walks the pain is sharp but when she sits it a dull ache.  No known injury

## 2016-08-24 ENCOUNTER — Emergency Department (HOSPITAL_BASED_OUTPATIENT_CLINIC_OR_DEPARTMENT_OTHER): Admit: 2016-08-24 | Discharge: 2016-08-24 | Disposition: A | Discharge status: Home / Self Care | Payer: 59

## 2016-08-24 ENCOUNTER — Encounter (HOSPITAL_COMMUNITY): Payer: Self-pay | Admitting: *Deleted

## 2016-08-24 ENCOUNTER — Emergency Department (HOSPITAL_COMMUNITY)
Admission: EM | Admit: 2016-08-24 | Discharge: 2016-08-24 | Disposition: A | Discharge status: Home / Self Care | Payer: 59 | Attending: Emergency Medicine | Admitting: Emergency Medicine

## 2016-08-24 DIAGNOSIS — M79604 Pain in right leg: Secondary | ICD-10-CM

## 2016-08-24 DIAGNOSIS — I10 Essential (primary) hypertension: Secondary | ICD-10-CM | POA: Insufficient documentation

## 2016-08-24 DIAGNOSIS — Z7982 Long term (current) use of aspirin: Secondary | ICD-10-CM | POA: Insufficient documentation

## 2016-08-24 DIAGNOSIS — Z79899 Other long term (current) drug therapy: Secondary | ICD-10-CM | POA: Diagnosis not present

## 2016-08-24 DIAGNOSIS — M79609 Pain in unspecified limb: Secondary | ICD-10-CM

## 2016-08-24 DIAGNOSIS — M79661 Pain in right lower leg: Secondary | ICD-10-CM | POA: Insufficient documentation

## 2016-08-24 NOTE — ED Triage Notes (Signed)
Pt states that she began having rt posterior knee pain with bruising. Pt states that the pain has progressed. Now reports bilateral leg pain that extends up to her pelvis. Pt states that she has hx of cerebral aneurisms with stent placement.. Pt reports stopping her birth control recently. Pt states that she takes plavix and asprin.

## 2016-08-24 NOTE — ED Provider Notes (Signed)
[facility] DEPT Provider Note   CSN: [id number] Arrival date & time: 08/24/16  4098     History   Chief Complaint Chief Complaint  Patient presents with  . Leg Pain    HPI Sharon Mendoza is a 46 y.o. female.  HPI  This is a 46 year old female with history of cerebral aneurysm which has been stented who has recently stopped her oral contraceptives presenting today with right lower extremity pain. She states it began to roll days ago with pain in the right lower leg. She attributed this to sciatica. However the pain extended up behind the right knee and into the right groin. She is also having some discomfort in the suprapubic region. Pain is worse with movement. She describes a sharp and constant. She has been taking Tylenol with some relief for this. She is unable to take nonsteroidals. She denies any history of DVT, PE, immobilization, or right lower extremity swelling. She states that she did note some bruising in the past couple days chest resolved. She denies any back pain, numbness, tingling, or weakness in any of her extremities. He has not had any recent injury. Her foot is not cold or painful at rest or with ambulation. Denies tobacco or drug use.  Past Medical History:  Diagnosis Date  . Abnormal pap   . Cerebral aneurysm without rupture   . Chest pain radiating to arm   . Family history of heart disease   . Gastroesophageal reflux   . Gestational diabetes   . Hyperlipidemia   . Hypertension   . Mitral valve regurgitation   . PONV (postoperative nausea and vomiting)     Patient Active Problem List   Diagnosis Date Noted  . Medication management 05/13/2015  . Essential hypertension 02/18/2015  . Hyperlipidemia 02/18/2015  . Chest pain 02/18/2015  . Brain aneurysm 11/21/2014  . Pain in joint, shoulder region 12/10/2013  . LBP radiating to left leg 12/10/2013  . Aneurysm, cerebral, nonruptured 12/10/2013  . Fibroid uterus 06/22/2012  . Menorrhagia 06/22/2012    . Dysmenorrhea 06/22/2012    Past Surgical History:  Procedure Laterality Date  . CARDIOVASCULAR STRESS TEST  05/19/11   05/19/11 Nuclear stress test ([facility]): Normal study, EF 87%  . CESAREAN SECTION  09/2005  . CT CORONARY CA SCORING  06/23/12   06/23/12 Cardiac CTA ([facility]): Ca score 0. Right dominant. Normal coronaries.  . INTRACRANIAL ANEURYSM REPAIR    . MYOMECTOMY  2005  . RADIOLOGY WITH ANESTHESIA N/A 11/21/2014   Procedure: Embolization;  Surgeon: Luanne Bras, MD;  Location: [place];  Service: Radiology;  Laterality: N/A;  . US ECHOCARDIOGRAPHY  05/18/11   05/18/11 Echo ([place]): NL chamber size, LV systolic function, wall motion. LVEF 77%. Mild LVH. Mild MR/TR. NL PAP.    OB History    Gravida Para Term Preterm AB Living   1 1       1    SAB TAB Ectopic Multiple Live Births                   Home Medications    Prior to Admission medications   Medication Sig Start Date End Date Taking? Authorizing Provider  aspirin 81 MG tablet Take 81 mg by mouth daily.    [provider]  cetirizine (ZYRTEC) 10 MG tablet Take 10 mg by mouth daily.    [provider]  clopidogrel (PLAVIX) 75 MG tablet Take 37.5 mg by mouth every other day.  [provider]  enalapril (VASOTEC) 5 MG tablet Take 1 tablet (5 mg total) by mouth daily. 03/05/16   Lorretta Harp, MD  nebivolol (BYSTOLIC) 5 MG tablet TAKE 1 TABLET (5 MG TOTAL) BY MOUTH DAILY. 11/14/15   Lorretta Harp, MD  norethindrone (MICRONOR,CAMILA,ERRIN) 0.35 MG tablet Take 1 tablet (0.35 mg total) by mouth daily. 03/09/16   Salvadore Dom, MD  pantoprazole (PROTONIX) 40 MG tablet Take 1 tablet (40 mg total) by mouth daily. Please establish care with a primary care physician for refills. 12/15/15   Lorretta Harp, MD  Vitamin D, Cholecalciferol, 1000 UNITS TABS Take 2,000 Units by mouth daily.    [provider]    Family History Family History  Problem  Relation Age of Onset  . Hypertension Mother   . Liver disease Mother   . Cancer Father        colon  . Heart disease Brother   . Aneurysm Brother   . Heart attack Brother   . Asthma Son   . Aneurysm Sister   . Aneurysm Brother   . Aneurysm Sister   . Heart attack Sister   . Aneurysm Sister     Social History Social History  Substance Use Topics  . Smoking status: Never Smoker  . Smokeless tobacco: Never Used  . Alcohol use No     Allergies   Patient has no known allergies.   Review of Systems Review of Systems  All other systems reviewed and are negative.    Physical Exam Updated Vital Signs BP 136/88 (BP Location: Left Arm)   Pulse 74   Temp 97.9 F (36.6 C) (Oral)   Resp 16   SpO2 100%   Physical Exam  Constitutional: She is oriented to person, place, and time. She appears well-developed and well-nourished. No distress.  HENT:  Head: Normocephalic and atraumatic.  Right Ear: External ear normal.  Left Ear: External ear normal.  Nose: Nose normal.  Eyes: Conjunctivae and EOM are normal. Pupils are equal, round, and reactive to light.  Neck: Normal range of motion. Neck supple.  Cardiovascular: Normal rate.   Pulmonary/Chest: Effort normal.  Abdominal: Soft. Bowel sounds are normal. She exhibits no distension. There is no tenderness. There is no guarding.  Musculoskeletal: Normal range of motion. She exhibits no edema, tenderness or deformity.  Dorsal pedalis pulses intact 1+ bilaterally. Posterior tibialis pulses intact bilaterally. Toes are pink with capillary refill less than 2 seconds. Sensation is intact. Mild tenderness in the right groin with palpation. No swelling or abnormalities noted. Femoral pulse is intact. Suprapubic tenderness noted  Neurological: She is alert and oriented to person, place, and time. She exhibits normal muscle tone. Coordination normal.  Skin: Skin is warm and dry.  Psychiatric: She has a normal mood and affect. Her behavior  is normal. Thought content normal.  Nursing note and vitals reviewed.    ED Treatments / Results  Labs (all labs ordered are listed, but only abnormal results are displayed) Labs Reviewed - No data to display  EKG  EKG Interpretation None       Radiology No results found.  Procedures Procedures (including critical care time)  Medications Ordered in ED Medications - No data to display   Initial Impression / Assessment and Plan / ED Course  I have reviewed the triage vital signs and the nursing notes.  Pertinent labs & imaging results that were available during my care of the patient were reviewed by me  and considered in my medical decision making (see chart for details).    Patient with right lower extreme pain. There is no obvious abnormality on physical exam. My primary concern was for a DVT. She has had a Doppler for this and is negative. Patient will use Tylenol and follow-up with her primary care We have discussed return precautions and need for close follow-up patient voices understanding. Final Clinical Impressions(s) / ED Diagnoses   Final diagnoses:  Right leg pain    New Prescriptions New Prescriptions   No medications on file     Pattricia Boss, MD 08/24/16 1057

## 2016-08-24 NOTE — Progress Notes (Signed)
*  PRELIMINARY RESULTS* Vascular Ultrasound Right lower extremity venous duplex has been completed.  Preliminary findings: no evidence of DVT of the RLE or left common femoral vein. No right baker's cyst.    Landry Mellow, RDMS, RVT  08/24/2016, 9:46 AM

## 2016-08-27 ENCOUNTER — Ambulatory Visit (HOSPITAL_COMMUNITY)
Admission: RE | Admit: 2016-08-27 | Discharge: 2016-08-27 | Disposition: A | Discharge status: Home / Self Care | Payer: 59 | Source: Ambulatory Visit | Attending: Interventional Radiology | Admitting: Interventional Radiology

## 2016-08-27 DIAGNOSIS — Z95828 Presence of other vascular implants and grafts: Secondary | ICD-10-CM | POA: Diagnosis not present

## 2016-08-27 DIAGNOSIS — Z9889 Other specified postprocedural states: Secondary | ICD-10-CM | POA: Insufficient documentation

## 2016-08-27 DIAGNOSIS — Z8679 Personal history of other diseases of the circulatory system: Secondary | ICD-10-CM | POA: Insufficient documentation

## 2016-08-27 DIAGNOSIS — I729 Aneurysm of unspecified site: Secondary | ICD-10-CM

## 2016-08-27 LAB — PLATELET INHIBITION P2Y12: Platelet Function  P2Y12: 175 [PRU] — ABNORMAL LOW (ref 194–418)

## 2016-08-30 NOTE — Telephone Encounter (Signed)
Patients MMG scheduled 08-31-16 -eh

## 2016-08-31 ENCOUNTER — Ambulatory Visit
Admission: RE | Admit: 2016-08-31 | Discharge: 2016-08-31 | Disposition: A | Discharge status: Home / Self Care | Payer: 59 | Source: Ambulatory Visit | Attending: Obstetrics and Gynecology | Admitting: Obstetrics and Gynecology

## 2016-08-31 ENCOUNTER — Telehealth (HOSPITAL_COMMUNITY): Payer: Self-pay | Admitting: *Deleted

## 2016-08-31 ENCOUNTER — Other Ambulatory Visit: Payer: Self-pay | Admitting: Obstetrics and Gynecology

## 2016-08-31 DIAGNOSIS — N6310 Unspecified lump in the right breast, unspecified quadrant: Secondary | ICD-10-CM

## 2016-08-31 DIAGNOSIS — R921 Mammographic calcification found on diagnostic imaging of breast: Secondary | ICD-10-CM

## 2016-08-31 NOTE — Telephone Encounter (Signed)
Called and left message for patient.  Per Dr. Estanislado Pandy pt is to continue taking ASA 81mg  daily and Plavix 37.5mg  daily.  Left phone number for patient to call if she has questions.

## 2016-09-02 ENCOUNTER — Telehealth: Payer: Self-pay | Admitting: *Deleted

## 2016-09-02 NOTE — Telephone Encounter (Signed)
-----   Message from Salvadore Dom, MD sent at 09/01/2016  6:18 PM EDT ----- Please have the patient come in for a breast exam. She c/o an area of thickening in her right breast.

## 2016-09-02 NOTE — Telephone Encounter (Signed)
Left message to call Lovella Hardie at [phone].  

## 2016-09-15 ENCOUNTER — Telehealth (HOSPITAL_COMMUNITY): Payer: Self-pay

## 2016-09-15 NOTE — Telephone Encounter (Signed)
Spoke with patient, advised as seen below per Dr. Talbert Nan. Patient states she will need a late afternoon, prefers Fridays, is aware Dr. Talbert Nan out of the office on Fridays. Patient scheduled for OV on 09/28/16 at 4pm with Dr. Talbert Nan. Patient states she is unsure why further exam is needed since MMG normal? Advised patient evaluation by Dr. Talbert Nan important  for comparison in future if there were any physical changes to the area. Patient verbalizes understanding and is agreeable.   Routing to provider for final review. Patient is agreeable to disposition. Will close encounter.

## 2016-09-15 NOTE — Telephone Encounter (Signed)
Left message to call Javier Mamone at [phone].  

## 2016-09-15 NOTE — Telephone Encounter (Signed)
Pt agreed to f/u with mra in 1 yr. AW

## 2016-09-28 ENCOUNTER — Ambulatory Visit (INDEPENDENT_AMBULATORY_CARE_PROVIDER_SITE_OTHER): Payer: 59 | Admitting: Obstetrics and Gynecology

## 2016-09-28 ENCOUNTER — Encounter: Payer: Self-pay | Admitting: Obstetrics and Gynecology

## 2016-09-28 ENCOUNTER — Ambulatory Visit: Payer: 59 | Admitting: Obstetrics and Gynecology

## 2016-09-28 VITALS — BP 100/62 | HR 60 | Ht 63.0 in | Wt 122.0 lb

## 2016-09-28 DIAGNOSIS — Z9189 Other specified personal risk factors, not elsewhere classified: Secondary | ICD-10-CM | POA: Diagnosis not present

## 2016-09-28 DIAGNOSIS — N6019 Diffuse cystic mastopathy of unspecified breast: Secondary | ICD-10-CM

## 2016-09-28 DIAGNOSIS — N912 Amenorrhea, unspecified: Secondary | ICD-10-CM

## 2016-09-28 MED ORDER — MEDROXYPROGESTERONE ACETATE 5 MG PO TABS: ORAL_TABLET | ORAL | 0 refills | Status: DC

## 2016-09-28 NOTE — Patient Instructions (Signed)
Fibrocystic Breast Changes Fibrocystic breast changes are changes in breast tissue that can cause breasts to become swollen, lumpy, or painful. This can happen due to buildup of scar-like tissue (fibrous tissue) or the forming of fluid-filled lumps (cysts) in the breast. This is a common condition, and it is not cancerous (is benign). The exact cause is not known, but it seems to occur when women go through hormonal changes during their menstrual cycle. Fibrocystic breast changes can affect one or both breasts. What are the causes? The exact cause of fibrocystic breast changes is not known. However, this condition:  May be related to the female hormones estrogen and progesterone.  May be influenced by family traits that get passed from parent to child (genetics).  What are the signs or symptoms? Symptoms of this condition may affect one or both breasts, and may include:  Tenderness, mild discomfort, or pain.  Swelling.  Rope-like tissue that can be felt when touching the breast.  Lumps in one or both breasts.  Changes in breast size. Breasts may get larger before the menstrual period and smaller after the menstrual period.  Green or dark brown discharge from the nipple.  Symptoms are usually worse before menstrual periods start, and they get better toward the end of menstrual periods. How is this diagnosed? This condition is diagnosed based on your medical history and a physical exam of your breasts. You may also have tests, such as:  A breast X-ray (mammogram).  Ultrasound of your breasts.  MRI.  Removal of a breast tissue sample for testing (breast biopsy). This may be done if your health care provider thinks that something else may be causing changes in your breasts.  How is this treated? Often, treatment is not needed for this condition. In some cases, treatment may include:  Taking over-the-counter pain relievers to help lessen pain or discomfort.  Limiting or avoiding  caffeine. Foods and beverages that contain caffeine include chocolate, soda, coffee, and tea.  Reducing sugar and fat in your diet.  Your health care provider may also recommend:  A procedure to remove fluid from a cyst that is causing pain (fine needle aspiration).  Surgery to remove a cyst that is large or tender or does not go away.  Follow these instructions at home:  Examine your breasts after every menstrual period. If you do not have menstrual periods, check your breasts on the first day of every month. Feel for changes in your breasts, such as: ? More tenderness. ? A new growth. ? A change in size. ? A change in an existing lump.  Take over-the-counter and prescription medicines only as told by your health care provider.  Wear a well-fitted support or sports bra, especially when exercising.  Decrease or avoid caffeine, fat, and sugar in your diet as directed by your health care provider. Contact a health care provider if:  You have fluid leaking from your nipple, especially if it is bloody.  You have new lumps or bumps in your breast.  Your breast becomes enlarged, red, and painful.  You have areas of your breast that pucker inward.  Your nipple appears flat or indented. Get help right away if:  You have redness of your breast and the redness is spreading. Summary  Fibrocystic breast changes are changes in breast tissue that can cause breasts to become swollen, lumpy, or painful.  This condition may be related to the female hormones estrogen and progesterone.  With this condition, it is important to examine   your breasts after every menstrual period. If you do not have menstrual periods, check your breasts on the first day of every month. This information is not intended to replace advice given to you by your health care provider. Make sure you discuss any questions you have with your health care provider. Document Released: 12/23/2005 Document Revised: 11/18/2015  Document Reviewed: 11/05/2015 Elsevier Interactive Patient Education  2017 Elsevier Inc.   

## 2016-09-28 NOTE — Progress Notes (Signed)
Patient ID: Sharon Mendoza, female   DOB: March 07, 1971, 46 y.o.   MRN: [id number]  GYNECOLOGY  VISIT   HPI: 46 y.o.   Single  African American  female   G1P1 with Patient's last menstrual period was 04/29/2015 (exact date).   here for breast check to follow up mammogram. Mammogram from 08/31/16 with category D density, stable calcifications in the upper-outer right breast. The patient c/o chronic thickening in the superior right breast, no specific changes seen with mammogram or ultrasound, she is here for breast exam. The patient noticed this thickening in 2/18, hasn't changed since then. It is mildly tender. She does drink caffeine, 2 cups of coffee and sometimes a soda.  She stopped micronor in 4/18 secondary to headaches, no cycles since. No hot flashes, some vaginal dryness. She was not having cycles on micronor  GYNECOLOGIC HISTORY: Patient's last menstrual period was 04/29/2015 (exact date). Contraception:None, not currently sexually active Menopausal hormone therapy: N/A        OB History    Gravida Para Term Preterm AB Living   1 1       1    SAB TAB Ectopic Multiple Live Births                     Patient Active Problem List   Diagnosis Date Noted  . Medication management 05/13/2015  . Essential hypertension 02/18/2015  . Hyperlipidemia 02/18/2015  . Chest pain 02/18/2015  . Brain aneurysm 11/21/2014  . Pain in joint, shoulder region 12/10/2013  . LBP radiating to left leg 12/10/2013  . Aneurysm, cerebral, nonruptured 12/10/2013  . Fibroid uterus 06/22/2012  . Menorrhagia 06/22/2012  . Dysmenorrhea 06/22/2012    Past Medical History:  Diagnosis Date  . Abnormal pap   . Cerebral aneurysm without rupture   . Chest pain radiating to arm   . Family history of heart disease   . Gastroesophageal reflux   . Gestational diabetes   . Hyperlipidemia   . Hypertension   . Mitral valve regurgitation   . PONV (postoperative nausea and vomiting)     Past Surgical History:   Procedure Laterality Date  . BREAST BIOPSY     age 82 left breast  . CARDIOVASCULAR STRESS TEST  05/19/11   05/19/11 Nuclear stress test ([facility]): Normal study, EF 87%  . CESAREAN SECTION  09/2005  . CT CORONARY CA SCORING  06/23/12   06/23/12 Cardiac CTA ([facility]): Ca score 0. Right dominant. Normal coronaries.  . INTRACRANIAL ANEURYSM REPAIR    . MYOMECTOMY  2005  . RADIOLOGY WITH ANESTHESIA N/A 11/21/2014   Procedure: Embolization;  Surgeon: Luanne Bras, MD;  Location: [place];  Service: Radiology;  Laterality: N/A;  . US ECHOCARDIOGRAPHY  05/18/11   05/18/11 Echo ([place]): NL chamber size, LV systolic function, wall motion. LVEF 77%. Mild LVH. Mild MR/TR. NL PAP.    Current Outpatient Prescriptions  Medication Sig Dispense Refill  . aspirin 81 MG tablet Take 81 mg by mouth daily.    . cetirizine (ZYRTEC) 10 MG tablet Take 10 mg by mouth daily.    . clopidogrel (PLAVIX) 75 MG tablet Take 37.5 mg by mouth every other day.     . enalapril (VASOTEC) 5 MG tablet Take 1 tablet (5 mg total) by mouth daily. 90 tablet 0  . nebivolol (BYSTOLIC) 5 MG tablet TAKE 1 TABLET (5 MG TOTAL) BY MOUTH DAILY. 30 tablet 7  . pantoprazole (PROTONIX) 40 MG tablet Take 1 tablet (  40 mg total) by mouth daily. Please establish care with a primary care physician for refills. 30 tablet 1  . Vitamin D, Cholecalciferol, 1000 UNITS TABS Take 2,000 Units by mouth daily.     No current facility-administered medications for this visit.      ALLERGIES: Patient has no known allergies.  Family History  Problem Relation Age of Onset  . Hypertension Mother   . Liver disease Mother   . Cancer Father        colon  . Heart disease Brother   . Aneurysm Brother   . Heart attack Brother   . Asthma Son   . Aneurysm Sister   . Aneurysm Brother   . Aneurysm Sister   . Heart attack Sister   . Aneurysm Sister   . Breast cancer Paternal Aunt   . Breast cancer Paternal Aunt   2 Paternal Aunts  with breast cancer, both in their 64's (5 PAunts without breast cancer)  Social History   Social History  . Marital status: Single    Spouse name: N/A  . Number of children: 1  . Years of education: BS   Occupational History  . 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 Smoking status: Never Smoker  . Smokeless tobacco: Never Used  . Alcohol use No  . Drug use: No  . Sexual activity: No     Comment: Loestrin   Other Topics Concern  . Not on file   Social History Narrative  . No narrative on file    Review of Systems  Constitutional: Negative.   HENT: Negative.   Eyes: Negative.   Respiratory: Negative.   Cardiovascular: Negative.   Gastrointestinal: Negative.   Genitourinary: Negative.   Musculoskeletal: Negative.   Skin: Negative.   Neurological: Negative.   Endo/Heme/Allergies: Negative.   Psychiatric/Behavioral: Negative.     PHYSICAL EXAMINATION:    BP 100/62 (BP Location: Right Arm, Patient Position: Sitting, Cuff Size: Normal)   Pulse 60   Ht 5\' 3"  (1.6 m)   Wt 122 lb (55.3 kg)   LMP 04/29/2015 (Exact Date) Comment: none since stopping OCP in 06/2016  BMI 21.61 kg/m     General appearance: alert, cooperative and appears stated age Breasts: normal appearance, bilaterally inverted nipples (no change), mildly tender on the inner upper right breast, no lumps. No axillary or supraclavicular adenopathy  Ty[facility]-Cuz[facility] Breast cancer risk assessment was done. Her lifetime risk of breast cancer is 24.2%, this doesn't take into account her very dense breasts.   ASSESSMENT TC lifetime risk of breast cancer 24.2%, she also has dense breasts Fibrocystic breasts, tender, no findings on diagnostic imaging, no masses on exam No cycle since stopping micronor, amenorrhea  PLAN Recommend breast MRI Cut back on caffeine Will try a provera W/D, she is to call with or without bleeding FSH, prolactin, TSH    An After Visit Summary was printed and given  to the patient.

## 2016-09-29 ENCOUNTER — Other Ambulatory Visit: Payer: Self-pay | Admitting: Obstetrics and Gynecology

## 2016-09-29 ENCOUNTER — Telehealth: Payer: Self-pay

## 2016-09-29 DIAGNOSIS — Z9189 Other specified personal risk factors, not elsewhere classified: Secondary | ICD-10-CM

## 2016-09-29 LAB — TSH: TSH: 0.669 u[IU]/mL (ref 0.450–4.500)

## 2016-09-29 LAB — FOLLICLE STIMULATING HORMONE: FSH: 13.8 m[IU]/mL

## 2016-09-29 LAB — PROLACTIN: Prolactin: 19.8 ng/mL (ref 4.8–23.3)

## 2016-09-29 MED ORDER — MEDROXYPROGESTERONE ACETATE 5 MG PO TABS: ORAL_TABLET | ORAL | 0 refills | Status: DC

## 2016-09-29 NOTE — Telephone Encounter (Signed)
-----   Message from Salvadore Dom, MD sent at 09/28/2016  4:52 PM EDT ----- Patient has an over 20% risk of breast cancer, please set her up for breast mri. Thanks, Sharee Pimple

## 2016-09-29 NOTE — Telephone Encounter (Signed)
Order placed for bilateral breast MRI w wo contrast for pre-authorization before scheduling.  MP:NTIR [employer]

## 2016-10-01 ENCOUNTER — Other Ambulatory Visit: Payer: Self-pay | Admitting: Cardiovascular Disease

## 2016-10-01 DIAGNOSIS — I10 Essential (primary) hypertension: Secondary | ICD-10-CM

## 2016-10-05 ENCOUNTER — Other Ambulatory Visit: Payer: Self-pay | Admitting: Obstetrics and Gynecology

## 2016-10-05 NOTE — Telephone Encounter (Signed)
Medication refill request: Provera  Last AEX:  03-09-16  Next AEX: 03-10-17  Last MMG (if hormonal medication request): 08-31-16 WNL  Refill authorized: please advise

## 2016-10-15 ENCOUNTER — Other Ambulatory Visit: Payer: Self-pay | Admitting: Obstetrics and Gynecology

## 2016-10-15 NOTE — Telephone Encounter (Signed)
Noted and communicated to [place].  Routing for review prior to closing encounter.

## 2016-10-15 NOTE — Telephone Encounter (Signed)
Peer to Peer consultation for breast MRI. Dr. Talbert Nan has recommended this due to increased lifetime risk of breast cancer:  24.2% by Tyrer-Cuzick Breast Cancer Risk assessment.   Authorization number:   [id number]. Good until:  October 15, 2016 - Sept 10, 2018.   Cc- Kaitlyn Sprague

## 2016-10-18 ENCOUNTER — Ambulatory Visit
Admission: RE | Admit: 2016-10-18 | Discharge: 2016-10-18 | Disposition: A | Discharge status: Home / Self Care | Payer: 59 | Source: Ambulatory Visit | Attending: Obstetrics and Gynecology | Admitting: Obstetrics and Gynecology

## 2016-10-18 DIAGNOSIS — Z9189 Other specified personal risk factors, not elsewhere classified: Secondary | ICD-10-CM

## 2016-10-18 MED ORDER — GADOBENATE DIMEGLUMINE 529 MG/ML IV SOLN
13.0000 mL | Freq: Once | INTRAVENOUS | Status: AC | PRN
  Administered 2016-10-18: 13 mL via INTRAVENOUS

## 2016-10-30 ENCOUNTER — Encounter: Payer: Self-pay | Admitting: Obstetrics and Gynecology

## 2016-10-30 ENCOUNTER — Other Ambulatory Visit: Payer: Self-pay | Admitting: Cardiovascular Disease

## 2016-10-30 DIAGNOSIS — I10 Essential (primary) hypertension: Secondary | ICD-10-CM

## 2016-11-01 ENCOUNTER — Telehealth: Payer: Self-pay | Admitting: *Deleted

## 2016-11-01 NOTE — Telephone Encounter (Signed)
Return call to patient. Advised of MRI result. Patient states she had received letter. Stressed importance of diagnostic MMG June 2019. Patient states cycle is much better now. This was first cycle she had since stopping OCP that she had been on for years. Cycle statred without taking provera but she needs to know if she is to take this monthly as discussed with Dr Talbert Nan.  Advised will review with provider and call back.

## 2016-11-01 NOTE — Telephone Encounter (Signed)
-----   Message from Megan Salon, MD sent at 10/29/2016  5:42 PM EDT ----- Please inform pt of [facility] breast MRI and need for bilateral diagnostic MMG June 2019.  Out of MMG hold.

## 2016-11-01 NOTE — Telephone Encounter (Signed)
My Chart message from patient:  Hi Dr. Talbert Nan. My period started 10/29/16 without me taking the provera. I need to know what I can do to shorten the length of my period and decrease the heavy flow. I haven't felt this bad since I was in my twenties.

## 2016-11-01 NOTE — Telephone Encounter (Signed)
Returning a call to triage.

## 2016-11-01 NOTE — Telephone Encounter (Signed)
Call to patient regarding breast MRI. Left message to call back to triage nurse.

## 2016-11-02 MED ORDER — ENALAPRIL MALEATE 5 MG PO TABS: 5.0000 mg | ORAL_TABLET | Freq: Every day | ORAL | 0 refills | Status: DC

## 2016-11-02 NOTE — Addendum Note (Signed)
Addended by: Waylan Rocher on: 11/02/2016 08:17 AM   Modules accepted: Orders

## 2016-11-02 NOTE — Telephone Encounter (Signed)
I would recommend that she take 5 mg of provera every other month only if she doesn't have a spontaneous cycle.   The first note from 11/01/16 discusses her heavy flow and feeling badly with this cycle. The second note sounds like the cycle was okay? She went off of micronor secondary to headaches. If she is having issues with her cycles she could have a mirena IUD placed to control her cycles. I'm happy to see her if she wants to come in discuss her options. If not, please have her calendar her cycles and come in to review in 3 months.

## 2016-11-03 NOTE — Telephone Encounter (Signed)
Call to patient, left message to call back. Ask for triage nurse. 

## 2016-11-16 NOTE — Telephone Encounter (Signed)
Left message to call Kaitlyn at [phone]. 

## 2016-11-16 NOTE — Telephone Encounter (Signed)
Patient returning your call.

## 2016-11-17 NOTE — Telephone Encounter (Signed)
Left message to call Sharon Mendoza at [phone]. 

## 2016-11-17 NOTE — Telephone Encounter (Signed)
Spoke with patient. Advised of message as seen below from [place]. Patient states that her menses is heavier since stopping Micronor. Also has increased cramping. Advised of recommendations as seen below from [place]. States she would like to monitor her menses and take Provera 5 mg x 5 days every other month if no spontaneous menses. Will calendar bleeding. Has an aex scheduled 03/10/2017.  Routing to provider for final review. Patient agreeable to disposition. Will close encounter.

## 2016-11-22 ENCOUNTER — Other Ambulatory Visit: Payer: Self-pay | Admitting: Cardiovascular Disease

## 2016-11-22 DIAGNOSIS — I10 Essential (primary) hypertension: Secondary | ICD-10-CM

## 2016-12-01 ENCOUNTER — Other Ambulatory Visit: Payer: Self-pay | Admitting: Cardiovascular Disease

## 2017-03-10 ENCOUNTER — Other Ambulatory Visit: Payer: Self-pay

## 2017-03-10 ENCOUNTER — Ambulatory Visit: Payer: 59 | Admitting: Obstetrics and Gynecology

## 2017-03-10 ENCOUNTER — Encounter: Payer: Self-pay | Admitting: Obstetrics and Gynecology

## 2017-03-10 VITALS — BP 102/60 | HR 80 | Resp 14 | Ht 63.0 in | Wt 121.0 lb

## 2017-03-10 DIAGNOSIS — N951 Menopausal and female climacteric states: Secondary | ICD-10-CM | POA: Diagnosis not present

## 2017-03-10 DIAGNOSIS — Z01419 Encounter for gynecological examination (general) (routine) without abnormal findings: Secondary | ICD-10-CM

## 2017-03-10 DIAGNOSIS — Z803 Family history of malignant neoplasm of breast: Secondary | ICD-10-CM | POA: Diagnosis not present

## 2017-03-10 DIAGNOSIS — Z800 Family history of malignant neoplasm of digestive organs: Secondary | ICD-10-CM | POA: Diagnosis not present

## 2017-03-10 DIAGNOSIS — N914 Secondary oligomenorrhea: Secondary | ICD-10-CM | POA: Diagnosis not present

## 2017-03-10 NOTE — Patient Instructions (Signed)

## 2017-03-10 NOTE — Progress Notes (Signed)
46 y.o. G1P1 SingleAfrican AmericanF here for annual exam.   H/O a fibroid uterus. H/O an aneurysm, s/p stent placement, on Plavix.  She stopped the micronor in April secondary to headaches. She has only had 2 cycles since then. The one in August was 4 days, cycle in November was 7 days. Saturating an ultra tampon in 2.5-3 hours, bad cramps. Some vaginal dryness, but tolerable. Not sexually active, no vasomotor symptoms.  She has intermittent right breast pain for over a year (she has had imaging). Not getting any worse. She drinks caffeine.  Period Duration (Days): 7 days  Period Pattern: (!) Irregular Menstrual Flow: Heavy, Moderate Menstrual Control: Tampon, Thin pad, Maxi pad Menstrual Control Change Freq (Hours): changes tampon every 3 hours on heavy days  Dysmenorrhea: (!) Moderate Dysmenorrhea Symptoms: Cramping  Patient's last menstrual period was 02/16/2017.          Sexually active: No.  The current method of family planning is none.    Exercising: Yes.    walking/ running  Smoker:  no  Health Maintenance: Pap:  03-09-16 WNL NEG HR HPV 06-22-12 WNL NEG HR HPV  History of abnormal Pap:  Yes- repeat PAP normal per patient  MMG:  10-18-16 Bilateral diagnostic mammogram in June 2019  Colonoscopy:  8 yrs ago- Normal per patient , she is overdue, FH of colon cancer in her Father at 4. BMD:   Never TDaP:  12-04-14 Gardasil: N/A   reports that  has never smoked. she has never used smokeless tobacco. She reports that she does not drink alcohol or use drugs. She has an 45 year old son, raising him on her own. 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 She is one of 14 children, very close with her siblings. Parents have passed.   Past Medical History:  Diagnosis Date  . Abnormal pap   . Cerebral aneurysm without rupture   . Chest pain radiating to arm   . Family history of heart disease   . Gastroesophageal reflux   . Gestational diabetes   .  Hyperlipidemia   . Hypertension   . Mitral valve regurgitation   . PONV (postoperative nausea and vomiting)     Past Surgical History:  Procedure Laterality Date  . BREAST BIOPSY     age 83 left breast  . CARDIOVASCULAR STRESS TEST  05/19/11   05/19/11 Nuclear stress test ([facility]): Normal study, EF 87%  . CESAREAN SECTION  09/2005  . CT CORONARY CA SCORING  06/23/12   06/23/12 Cardiac CTA ([facility]): Ca score 0. Right dominant. Normal coronaries.  . INTRACRANIAL ANEURYSM REPAIR    . MYOMECTOMY  2005  . RADIOLOGY WITH ANESTHESIA N/A 11/21/2014   Procedure: Embolization;  Surgeon: Luanne Bras, MD;  Location: [facility];  Service: Radiology;  Laterality: N/A;  . US ECHOCARDIOGRAPHY  05/18/11   05/18/11 Echo ([place]): NL chamber size, LV systolic function, wall motion. LVEF 77%. Mild LVH. Mild MR/TR. NL PAP.    Current Outpatient Medications  Medication Sig Dispense Refill  . aspirin 81 MG tablet Take 81 mg by mouth daily.    Marland Kitchen BYSTOLIC 5 MG tablet TAKE ONE TABLET BY MOUTH DAILY 30 tablet 3  . cetirizine (ZYRTEC) 10 MG tablet Take 10 mg by mouth daily.    . clopidogrel (PLAVIX) 75 MG tablet Take 37.5 mg by mouth every other day.     . enalapril (VASOTEC) 5 MG tablet Take 1 tablet (5 mg  total) by mouth daily. Please schedule appointment for refills 30 tablet 6  . Vitamin D, Cholecalciferol, 1000 UNITS TABS Take 2,000 Units by mouth daily.     No current facility-administered medications for this visit.     Family History  Problem Relation Age of Onset  . Hypertension Mother   . Liver disease Mother   . Cancer Father        colon  . Heart disease Brother   . Aneurysm Brother   . Heart attack Brother   . Asthma Son   . Aneurysm Sister   . Aneurysm Brother   . Aneurysm Sister   . Heart attack Sister   . Aneurysm Sister   . Breast cancer Paternal Aunt   . Breast cancer Paternal 75   Dad was one of 8 kids, 8 girls, 2 of then had breast cancer (she  isn't aware of the rest of the family). One aunt was in her 45's, other not sure.   Review of Systems  Constitutional: Negative.   HENT: Negative.   Eyes: Negative.   Respiratory: Negative.   Cardiovascular: Negative.   Gastrointestinal: Negative.   Endocrine: Negative.   Genitourinary:       Right breast pain   Musculoskeletal: Negative.   Skin: Negative.   Allergic/Immunologic: Negative.   Neurological: Negative.   Psychiatric/Behavioral: Negative.     Exam:   BP 102/60 (BP Location: Right Arm, Patient Position: Sitting, Cuff Size: Normal)   Pulse 80   Resp 14   Ht 5\' 3"  (1.6 m)   Wt 121 lb (54.9 kg)   LMP 02/16/2017   BMI 21.43 kg/m   Weight change: @WEIGHTCHANGE @ Height:   Height: 5\' 3"  (160 cm)  Ht Readings from Last 3 Encounters:  03/10/17 5\' 3"  (1.6 m)  09/28/16 5\' 3"  (1.6 m)  08/23/16 5\' 3"  (1.6 m)    General appearance: alert, cooperative and appears stated age Head: Normocephalic, without obvious abnormality, atraumatic Neck: no adenopathy, supple, symmetrical, trachea midline and thyroid normal to inspection and palpation Lungs: clear to auscultation bilaterally Cardiovascular: regular rate and rhythm Breasts: normal appearance, no masses or tenderness Abdomen: soft, non-tender; non distended,  no masses,  no organomegaly Extremities: extremities normal, atraumatic, no cyanosis or edema Skin: Skin color, texture, turgor normal. No rashes or lesions Lymph nodes: Cervical, supraclavicular, and axillary nodes normal. No abnormal inguinal nodes palpated Neurologic: Grossly normal   Pelvic: External genitalia:  no lesions              Urethra:  normal appearing urethra with no masses, tenderness or lesions              Bartholins and Skenes: normal                 Vagina: normal appearing vagina with normal color and discharge, no lesions              Cervix: no lesions               Bimanual Exam:  Uterus:  normal size, contour, position, consistency,  mobility, non-tender              Adnexa: no mass, fullness, tenderness               Rectovaginal: Confirms               Anus:  normal sphincter tone, no lesions  Chaperone was present for exam.  A:  Well Woman with normal  exam  H/o fibroid uterus  H/O gestational diabetes  H/O aneurysm, s/p clip placement, on Plavix  Oligomenorrhea, c/w perimenopause. She has had normal TSH, prolactin and FSH in 7/18  Family history of colon and breast cancer  P:   No pap this year  Discussed breast self exam  Discussed calcium and vit D intake  She has cyclic provera to take, discussed reasoning  Will set up a genetic counseling appointment  Over due for a colonoscopy  Mammogram UTD

## 2017-03-19 ENCOUNTER — Telehealth: Payer: Self-pay | Admitting: Genetics

## 2017-03-19 NOTE — Telephone Encounter (Signed)
LVM FOR PATIENT ABOUT GENETICS APPOINTMENT

## 2017-03-28 ENCOUNTER — Encounter: Payer: Self-pay | Admitting: Obstetrics and Gynecology

## 2017-03-29 ENCOUNTER — Telehealth: Payer: Self-pay | Admitting: Obstetrics and Gynecology

## 2017-03-29 MED ORDER — NORETHINDRONE 0.35 MG PO TABS: 1.0000 | ORAL_TABLET | Freq: Every day | ORAL | 3 refills | Status: DC

## 2017-03-29 NOTE — Telephone Encounter (Signed)
Yes, she can restart POP. Will call in a script for the year. Please let the patient know. She should call if she has recurrent headaches or any other concerns on the medication

## 2017-03-29 NOTE — Telephone Encounter (Signed)
Left message to call Brelee Renk at [phone].  

## 2017-03-29 NOTE — Telephone Encounter (Signed)
Patient sent the following message through [place]. Routing to triage to assist patient with her question.  ----- Message from [place], Generic sent at 03/28/2017 9:21 PM EST -----    Dr. Talbert Nan. I started my cycle Sunday. I really would like to start my pills again. The bleeding is too much for me.    Sharon Mendoza   Last seen: 03/10/17

## 2017-03-29 NOTE — Telephone Encounter (Signed)
Spoke with patient. Patient requesting to restart POP. Reports cycle August, November and 03/26/17. Cycles are becoming increasingly heavy, changing super tampon q2-3 hours and lasting 7 days.   Reports nausea, menstrual like cramping and fatigue.   Patient states she discussed restarting POP at last AEX 03/10/17.   Advised patient will review with Dr. Talbert Nan and return call with recommendations, patient is agreeable.   Dr. Talbert Nan - please review and advise?

## 2017-03-30 ENCOUNTER — Telehealth: Payer: Self-pay | Admitting: Genetics

## 2017-03-30 ENCOUNTER — Encounter: Payer: Self-pay | Admitting: Genetics

## 2017-03-30 NOTE — Telephone Encounter (Signed)
Spoke with patient, advised as seen below per Dr. Jertson. Patient verbalizes understanding and is agreeable. Will close encounter.  

## 2017-03-30 NOTE — Telephone Encounter (Signed)
left message with basic information about genetics/insurance (typically covered benefit- provided cpt code for appointment) and my number to call back with questions. The cost of genetic testing itself is something we discuss during her appointment if she meets insurance criteria/cost, etc.

## 2017-04-06 ENCOUNTER — Other Ambulatory Visit: Payer: Self-pay | Admitting: Cardiovascular Disease

## 2017-04-06 NOTE — Telephone Encounter (Signed)
REFILL 

## 2017-05-10 ENCOUNTER — Inpatient Hospital Stay: Payer: 59

## 2017-05-10 ENCOUNTER — Encounter: Payer: Self-pay | Admitting: Genetics

## 2017-05-10 ENCOUNTER — Inpatient Hospital Stay: Payer: 59 | Attending: Genetic Counselor | Admitting: Genetics

## 2017-05-10 DIAGNOSIS — Z315 Encounter for genetic counseling: Secondary | ICD-10-CM | POA: Diagnosis not present

## 2017-05-10 DIAGNOSIS — Z808 Family history of malignant neoplasm of other organs or systems: Secondary | ICD-10-CM

## 2017-05-10 DIAGNOSIS — Z8371 Family history of colonic polyps: Secondary | ICD-10-CM

## 2017-05-10 DIAGNOSIS — Z803 Family history of malignant neoplasm of breast: Secondary | ICD-10-CM | POA: Diagnosis not present

## 2017-05-10 DIAGNOSIS — Z800 Family history of malignant neoplasm of digestive organs: Secondary | ICD-10-CM | POA: Diagnosis not present

## 2017-05-10 NOTE — Progress Notes (Signed)
REFERRING PROVIDER: Salvadore Dom, [address] [place], [place] [address]  PRIMARY PROVIDER:  Patient, No Pcp Per  PRIMARY REASON FOR VISIT:  1. Family history of colon cancer   2. Family history of breast cancer      HISTORY OF PRESENT ILLNESS:   Sharon Mendoza, a 47 y.o. female, was seen for a [facility] cancer genetics consultation at the request of Dr. Talbert Nan due to a family history of breast and colon cancer.  Sharon Mendoza presents to clinic today to discuss the possibility of a hereditary predisposition to cancer, genetic testing, and to further clarify her future cancer risks, as well as potential cancer risks for family members.   Sharon Mendoza is a 47 y.o. female with no personal history of cancer.    CANCER HISTORY:   No history exists.     HORMONAL RISK FACTORS:  Menarche was at age 31.  First live birth at age 41.  OCP use for approximately 25 years.  Ovaries intact: yes.  Hysterectomy: no.  Menopausal status: premenopausal.  HRT use: 0 years. Colonoscopy: yes; 2010- normal. going this year for next one. Mammogram within the last year: yes. Density D, fibrocyst. - probably benign Number of breast biopsies: 1. At 22, had lum removed, unk pathology but benign  Past Medical History:  Diagnosis Date  . Abnormal pap   . Cerebral aneurysm without rupture   . Chest pain radiating to arm   . Family history of breast cancer   . Family history of colon cancer   . Family history of heart disease   . Gastroesophageal reflux   . Gestational diabetes   . Hyperlipidemia   . Hypertension   . Mitral valve regurgitation   . PONV (postoperative nausea and vomiting)     Past Surgical History:  Procedure Laterality Date  . BREAST BIOPSY     age 31 left breast  . CARDIOVASCULAR STRESS TEST  05/19/11   05/19/11 Nuclear stress test ([facility]): Normal study, EF 87%  . CESAREAN SECTION  09/2005  . CT CORONARY CA SCORING  06/23/12   06/23/12 Cardiac  CTA ([facility]): Ca score 0. Right dominant. Normal coronaries.  . INTRACRANIAL ANEURYSM REPAIR    . MYOMECTOMY  2005  . RADIOLOGY WITH ANESTHESIA N/A 11/21/2014   Procedure: Embolization;  Surgeon: Luanne Bras, MD;  Location: [facility];  Service: Radiology;  Laterality: N/A;  . US ECHOCARDIOGRAPHY  05/18/11   05/18/11 Echo ([facility]): NL chamber size, LV systolic function, wall motion. LVEF 77%. Mild LVH. Mild MR/TR. NL PAP.    Social History   Socioeconomic History  . Marital status: Single    Spouse name: None  . Number of children: 1  . Years of education: BS  . Highest education level: None  Social Needs  . Financial resource strain: None  . Food insecurity - worry: None  . Food insecurity - inability: None  . Transportation needs - medical: None  . Transportation needs - non-medical: None  Occupational History  . Occupation: PHARMACIST    Employer: [employer]  Tobacco Use  . Smoking status: Never Smoker  . Smokeless tobacco: Never Used  Substance and Sexual Activity  . Alcohol use: No    Alcohol/week: 0.0 oz  . Drug use: No  . Sexual activity: No    Partners: Male  Other Topics Concern  . None  Social History Narrative  . None     FAMILY HISTORY:  We  obtained a detailed, 4-generation family history.  Significant diagnoses are listed below: Family History  Problem Relation Age of Onset  . Hypertension Mother   . Liver disease Mother   . Colon cancer Father 41  . Throat cancer Father 22  . Heart disease Brother   . Aneurysm Brother   . Heart attack Brother   . Asthma Son   . Aneurysm Sister   . Aneurysm Brother   . Aneurysm Sister   . Heart attack Sister   . Aneurysm Sister 16  . Stroke Maternal Grandmother   . Breast cancer Paternal Aunt 82       metastatic  . Breast cancer Paternal Aunt 74       metastatic  . Colon polyps Brother    Sharon Mendoza has an 1 year-old son with no history of cancer. Sharon Mendoza has 5 sisters and 8  brothers. -1 sister died of a cerebral anneruysm at 35.  She had a son who is now 78.  -31 other sisters with anneurysms in their 3's-60's -1 sister is 84 with no history of cancer. -5 brothers ages 8-60's with no history of cancer.   -2 brothers with aneurysms.  -1 brother who has had some colon polyps removed.  Sharon Mendoza reports one of her sisters had a son who has cancer, but is unsure what type of cancer he has.   Sharon Mendoza father was diagnosed with colon cancer at 57 and throat cancer in his 60's. Sharon Mendoza has 8 paternal aunts.  2 of these aunts died of breast cancer in their 10's.  One aunt had nasal cancer in her 59's and died at 27.  36 other paternal aunts with no history of cancer.  6 paternal uncles with no history of cancer.  Sharon Mendoza has limited information about all of her paternal cousins, but is not aware of any cancer history.  Sharon Mendoza paternal grandfather died in his 92's and had diabetes.  Her paternal grandmother died at 69- cause unk.   Sharon Mendoza mother died at 37 due to liver disease.  Sharon Mendoza has a maternal aunt who died of lung cancer and 5 other maternal aunts with no history of cancer.  Sharon Mendoza has 6 maternal uncles with no history of cancer.  Sharon Mendoza does not know if any of her maternal cousins have been diagnosed with cancer.  Sharon Mendoza maternal grandfather died before she was born and she knows no information about him.  Her maternal grandmother died at 56 due to age related diesease, and had a stroke.    Sharon Mendoza is unaware of previous family history of genetic testing for hereditary cancer risks. Patient's maternal ancestors are of African American/Caucasian descent, and paternal ancestors are of African American/Native American descent. There is no reported Ashkenazi Jewish ancestry. There is no known consanguinity.  GENETIC COUNSELING ASSESSMENT: Anjana Cheek is a 47 y.o. female with a family history which is  somewhat suggestive of a Hereditary Cancer Predisposition Syndrome. We, therefore, discussed and recommended the following at today's visit.   DISCUSSION: We reviewed the characteristics, features and inheritance patterns of hereditary cancer syndromes. We also discussed genetic testing, including the appropriate family members to test, the process of testing, insurance coverage and turn-around-time for results. We discussed the implications of a negative, positive and/or variant of uncertain significant result. We recommended Sharon Mendoza pursue genetic testing for the Common Hereditary Cancer gene panel. The Common Hereditary Cancer Panel offered by [facility] includes  sequencing and/or deletion duplication testing of the following 47 genes: APC, ATM, AXIN2, BARD1, BMPR1A, BRCA1, BRCA2, BRIP1, CDH1, CDKN2A (p14ARF), CDKN2A (p16INK4a), CKD4, CHEK2, CTNNA1, DICER1, EPCAM (Deletion/duplication testing only), GREM1 (promoter region deletion/duplication testing only), KIT, MEN1, MLH1, MSH2, MSH3, MSH6, MUTYH, NBN, NF1, NHTL1, PALB2, PDGFRA, PMS2, POLD1, POLE, PTEN, RAD50, RAD51C, RAD51D, SDHB, SDHC, SDHD, SMAD4, SMARCA4. STK11, TP53, TSC1, TSC2, and VHL.  The following genes were evaluated for sequence changes only: SDHA and HOXB13 c.251G>A variant only.  We discussed that only 5-10% of cancers are associated with a Hereditary Cancer Predisposition Syndrome.  The most common hereditary cancer syndrome associated with colon cancer is Lynch Syndrome.  Lynch Syndrome is caused by mutations in the genes: MLH1, MSH2, MSH6, PMS2 and EPCAM.  This syndrome increases the risk for colon, uterine, ovarian and stomach cancers, as well as others.  Families with Lynch Syndrome tend to have multiple family members with these cancers, typically diagnosed under age 66, and diagnoses in multiple generations.    We discussed that there are several other genes that are associated with an increased risk for colon cancer and increased  polyp burden (MUTYH, APC, POLE, CHEK2, etc.) We also dicussed that there are many genes that cause many different types of cancer risks.    We briefly discussed that there are genes such as the BRCA1 and BRCA2 genes that are associated with increased breast cancer risk.  Ms. Munos family history is not highly suspicious for Hereditary breast cancer, however if genetic testing is ordered for Lynch Syndrome assessment, these genes can be analyzed if desired.   We discussed that if she is found to have a mutation in one of these genes, it may impact future medical management recommendations such as increased cancer screenings and consideration of risk reducing surgeries.  A positive result could also have implications for the patient's family members.  A Negative result would mean we were unable to identify a hereditary component to her cancer, but does not rule out the possibility of a hereditary basis for her cancer.  There could be mutations that are undetectable by current technology, or in genes not yet tested or identified to increase cancer risk.    We discussed the potential to find a Variant of Uncertain Significance or VUS.  These are variants that have not yet been identified as pathogenic or benign, and it is unknown if this variant is associated with increased cancer risk or if this is a normal finding.  Most VUS's are reclassified to benign or likely benign.   It should not be used to make medical management decisions. With time, we suspect the lab will determine the significance of any VUS's identified if any.   We discussed that some people do not want to undergo genetic testing due to fear of genetic discrimination.  A federal law called the Genetic Information Non-Discrimination Act (GINA) of 2008 helps protect individuals against genetic discrimination based on their genetic test results.  It impacts both health insurance and employment.  For health insurance, it protects against  increased premiums, being kicked off insurance or being forced to take a test in order to be insured.  For employment it protects against hiring, firing and promoting decisions based on genetic test results.  Health status due to a cancer diagnosis is not protected under GINA.  This law does not protect life insurance, disability insurance, or other types of insurance.   Based on Sharon Mendoza's family history of cancer, she meets medical criteria  for genetic testing. Despite that she meets criteria, she may still have an out of pocket cost. We discussed that if her out of pocket cost for testing is over $100, the laboratory will call and confirm whether she wants to proceed with testing.  If the out of pocket cost of testing is less than $100 she will be billed by the genetic testing laboratory.   Based on the patient's personal and family history, the statistical model Tobey Bride Cusik Was used to estimate her risk of developing breast cancer. This estimates her lifetime risk of developing breast cacner to be approximately 26.8%. This estimation does not take into account any genetic testing results.  The patient's lifetime breast cancer risk is a preliminary estimate based on available information using one of several models endorsed by the [place] (ACS). The ACS recommends consideration of breast MRI screening as an adjunct to mammography for patients at high risk (defined as 20% or greater lifetime risk). A more detailed breast cancer risk assessment can be considered, if clinically indicated.   Ms. Munroe has been determined to be at high risk for breast cancer.  Therefore, we recommend that annual screening with mammography and breast MRI begin at age 41, or 10 years prior to the age of breast cancer diagnosis in a relative (whichever is earlier).  We discussed that Ms.  Rudden should discuss her individual situation with her referring physician and determine a breast cancer screening plan  with which they are both comfortable.      PLAN: After considering the risks, benefits, and limitations, Ms. Will  provided informed consent to pursue genetic testing and the blood sample was sent to [facility] for analysis of the Common Hereditary Cancer Panel. Results should be available within approximately 2-3 weeks' time, at which point they will be disclosed by telephone to Ms. Meckel, as will any additional recommendations warranted by these results. Ms. Azucena will receive a summary of her genetic counseling visit and a copy of her results once available. This information will also be available in Epic. We encouraged Ms. Pelham to remain in contact with cancer genetics annually so that we can continuously update the family history and inform her of any changes in cancer genetics and testing that may be of benefit for her family. Ms. Englander questions were answered to her satisfaction today. Our contact information was provided should additional questions or concerns arise.  Based on Ms. Wiles's family history, we recommended her siblings and all paternal relatives,  have genetic counseling and testing. Ms. Bohnet will let us know if we can be of any assistance in coordinating genetic counseling and/or testing for this family member.   Lastly, we encouraged Ms. Royse to remain in contact with cancer genetics annually so that we can continuously update the family history and inform her of any changes in cancer genetics and testing that may be of benefit for this family.   Ms.  Swiech questions were answered to her satisfaction today. Our contact information was provided should additional questions or concerns arise. Thank you for the referral and allowing [place] to share in the care of your patient.   Tana Felts, MS Genetic Counselor [email or web address] phone: [phone]  The patient was seen for a total of 35 minutes in face-to-face genetic  counseling.

## 2017-05-14 ENCOUNTER — Other Ambulatory Visit: Payer: Self-pay | Admitting: Cardiovascular Disease

## 2017-05-16 NOTE — Telephone Encounter (Signed)
REFILL 

## 2017-05-18 ENCOUNTER — Telehealth: Payer: Self-pay | Admitting: Cardiovascular Disease

## 2017-05-18 MED ORDER — NEBIVOLOL HCL 5 MG PO TABS: 5.0000 mg | ORAL_TABLET | Freq: Every day | ORAL | 0 refills | Status: DC

## 2017-05-18 NOTE — Telephone Encounter (Signed)
New Message    *STAT* If patient is at the pharmacy, call can be transferred to refill team.   1. Which medications need to be refilled? (please list name of each medication and dose if known) nebivolol (BYSTOLIC) 5 MG tablet  2. Which pharmacy/location (including street and city if local pharmacy) is medication to be sent to? [id number] - [place], [place]  3. Do they need a 30 day or 90 day supply? 30 day  Appt scheduled with Dr. Gwenlyn Found for 06/14/17 for medication refill

## 2017-06-02 ENCOUNTER — Other Ambulatory Visit: Payer: Self-pay | Admitting: Cardiovascular Disease

## 2017-06-03 ENCOUNTER — Telehealth: Payer: Self-pay | Admitting: Genetics

## 2017-06-06 NOTE — Telephone Encounter (Signed)
Revealed negative genetic testing.  Revealed that a VUS in POLE was identified.    While reassuring, we discussed that we do not know what caused her family history of cancer.  It could be that there is a genetic cause for her family history of cancer that she did not inherit- that is why we recommended her siblings and all paternal relatives have genetic testing as well. It could also be that there is a mutation in a gene not yet discovered to increase cancer risk or our a mutation our current technology cannot yet detect.   It will be important for her to keep in contact with genetics to learn if additional testing may be needed in the future.  We recommended she have colonoscopies every 5 years (or as directed by her GI physician) due to the family history .  We also recommended high risk breast screening due to her Lilian Kapur breast risk model being >20% risk.    Released results to her electronically through [facility] secure portal.

## 2017-06-08 ENCOUNTER — Encounter: Payer: Self-pay | Admitting: Genetics

## 2017-06-08 ENCOUNTER — Ambulatory Visit: Payer: Self-pay | Admitting: Genetics

## 2017-06-08 DIAGNOSIS — Z800 Family history of malignant neoplasm of digestive organs: Secondary | ICD-10-CM

## 2017-06-08 DIAGNOSIS — Z1379 Encounter for other screening for genetic and chromosomal anomalies: Secondary | ICD-10-CM | POA: Insufficient documentation

## 2017-06-08 DIAGNOSIS — Z803 Family history of malignant neoplasm of breast: Secondary | ICD-10-CM

## 2017-06-08 NOTE — Progress Notes (Signed)
HPI: [name] was previously seen in the [place] clinic on 05/10/2017 due to a family history of colon, breast, and lung cancer and concerns regarding a hereditary predisposition to cancer. Please refer to our prior cancer genetics clinic note for more information regarding [name]'s medical, social and family histories, and our assessment and recommendations, at the time. [name] recent genetic test results were disclosed to her, as well as recommendations warranted by these results. These results and recommendations are discussed in more detail below.  CANCER HISTORY:   No history exists.     FAMILY HISTORY:  We obtained a detailed, 4-generation family history.  Significant diagnoses are listed below: Family History  Problem Relation Age of Onset  . Hypertension Mother   . Liver disease Mother   . Colon cancer Father 10  . Throat cancer Father 19  . Heart disease Brother   . Aneurysm Brother   . Heart attack Brother   . Asthma Son   . Aneurysm Sister   . Aneurysm Brother   . Aneurysm Sister   . Heart attack Sister   . Aneurysm Sister 27  . Stroke Maternal Grandmother   . Breast cancer Paternal Aunt 20       metastatic  . Breast cancer Paternal Aunt 25       metastatic  . Colon polyps Brother     Sharon Mendoza has an 20 year-old son with no history of cancer. Sharon Mendoza has 5 sisters and 8 brothers. -1 sister died of a cerebral anneruysm at 46.  She had a son who is now 8.  -69 other sisters with anneurysms in their 59's-60's -1 sister is 31 with no history of cancer. -5 brothers ages 56-60's with no history of cancer.   -2 brothers with aneurysms.  -1 brother who has had some colon polyps removed.  Sharon Mendoza reports one of her sisters had a son who has cancer, but is unsure what type of cancer he has.   Sharon Mendoza father was diagnosed with colon cancer at 17 and throat cancer in his 3's. Sharon Mendoza has 8 paternal aunts.  2 of  these aunts died of breast cancer in their 66's.  One aunt had nasal cancer in her 13's and died at 29.  74 other paternal aunts with no history of cancer.  6 paternal uncles with no history of cancer.  Sharon Mendoza has limited information about all of her paternal cousins, but is not aware of any cancer history.  Sharon Mendoza paternal grandfather died in his 61's and had diabetes.  Her paternal grandmother died at 47- cause unk.   Sharon Mendoza mother died at 33 due to liver disease.  Sharon Mendoza has a maternal aunt who died of lung cancer and 5 other maternal aunts with no history of cancer.  Sharon Mendoza has 6 maternal uncles with no history of cancer.  Sharon Mendoza does not know if any of her maternal cousins have been diagnosed with cancer.  Sharon Mendoza maternal grandfather died before she was born and she knows no information about him.  Her maternal grandmother died at 98 due to age related diesease, and had a stroke.    Sharon Mendoza is unaware of previous family history of genetic testing for hereditary cancer risks. Patient's maternal ancestors are of African American/Caucasian descent, and paternal ancestors are of African American/Native American descent. There is no reported Ashkenazi Jewish ancestry. There is no known consanguinity.  GENETIC TEST  RESULTS: Genetic testing performed through [facility]'s Common Hereditary Cancers Panel reported out on 06/07/2017 showed no pathogenic mutations. The Common Hereditary Cancer Panel offered by [facility] includes sequencing and/or deletion duplication testing of the following 47 genes: APC, ATM, AXIN2, BARD1, BMPR1A, BRCA1, BRCA2, BRIP1, CDH1, CDKN2A (p14ARF), CDKN2A (p16INK4a), CKD4, CHEK2, CTNNA1, DICER1, EPCAM (Deletion/duplication testing only), GREM1 (promoter region deletion/duplication testing only), KIT, MEN1, MLH1, MSH2, MSH3, MSH6, MUTYH, NBN, NF1, NHTL1, PALB2, PDGFRA, PMS2, POLD1, POLE, PTEN, RAD50, RAD51C, RAD51D, SDHB, SDHC, SDHD, SMAD4,  SMARCA4. STK11, TP53, TSC1, TSC2, and VHL.  The following genes were evaluated for sequence changes only: SDHA and HOXB13 c.251G>A variant only..  A variant of uncertain significance (VUS) in a gene called POLE was also noted. c.1274A>G (p.Lys425Arg)x  The test report will be scanned into EPIC and will be located under the Molecular Pathology section of the Results Review tab.A portion of the result report is included below for reference.     We discussed with [name] that because current genetic testing is not perfect, it is possible there may be a gene mutation in one of these genes that current testing cannot detect, but that chance is small. We also discussed, that there could be another gene that has not yet been discovered, or that we have not yet tested, that is responsible for the cancer diagnoses in the family. It is also possible there is a hereditary cause for the cancer in the family that [name] did not inherit and therefore was not identified in her testing.  Therefore, it is important to remain in touch with cancer genetics in the future so that we can continue to offer [name] the most up to date genetic testing.   Regarding the VUS in POLE: At this time, it is unknown if this variant is associated with increased cancer risk or if this is a normal finding, but most variants such as this get reclassified to being inconsequential. It should not be used to make medical management decisions. With time, we suspect the lab will determine the significance of this variant, if any. If we do learn more about it, we will try to contact Sharon Mendoza to discuss it further. However, it is important to stay in touch with [place] periodically and keep the address and phone number up to date.  ADDITIONAL GENETIC TESTING: We discussed with [name] that there are other genes that are associated with increased cancer risk that can be analyzed. The laboratories that offer this testing look at  these additional genes via a hereditary cancer gene panel. Should [name] wish to pursue additional genetic testing, we are happy to discuss and coordinate this testing, at any time.    CANCER SCREENING RECOMMENDATIONS: [name] test result is considered negative (normal).  This means that we have not identified a hereditary cause for her family history of cancer at this time.   While reassuring, this does not definitively rule out a hereditary predisposition to cancer. It is still possible that there could be genetic mutations that are undetectable by current technology, or genetic mutations in genes that have not been tested or identified to increase cancer risk.  It is also possible there was a hereditary cause for the cancer in her family that Ms. Debruler did not inherit and therefore was not identified in her genetic testing.  It is recommended she continue to follow the cancer management and screening guidelines provided by her healthcare providers. Other factors such as her personal and  family history may still affect her cancer risk.    Based on the family history of colon cancer, in her father, we recommended [name] have colonoscopies every 5 years starting by age 38 (or as directed by her GI physicians).   Based on the patient's personal and family history, the statistical model Tobey Bride Cusik Was used to estimate her risk of developing breast cancer. This estimates her lifetime risk of developing breast cacner to be approximately 26.8%.  The patient's lifetime breast cancer risk is a preliminary estimate based on available information using one of several models endorsed by the [place] (ACS). The ACS recommends consideration of breast MRI screening as an adjunct to mammography for patients at high risk (defined as 20% or greater lifetime risk). A more detailed breast cancer risk assessment can be considered, if clinically indicated.   [name] has been  determined to be at high risk for breast cancer. Therefore, we recommend that annual screening with mammography and breast MRI begin at age 5, or 10 years prior to the age of breast cancer diagnosis in a relative (whichever is earlier). We discussed that Ms.  Sharon Mendoza should discuss her individual situation with her referring physician and determine a breast cancer screening plan with which they are both comfortable.      RECOMMENDATIONS FOR FAMILY MEMBERS: Individuals in this family might be at some increased risk of developing cancer, over the general population risk, simply due to the family history of cancer. We recommended women in this family have a yearly mammogram beginning at age 23, or 18 years younger than the earliest onset of cancer, an annual clinical breast exam, and perform monthly breast self-exams. Women in this family should also have a gynecological exam as recommended by their primary provider. All family members should have a colonoscopy by age 63 or 65 years earlier that the youngest onset in the family.  All family members should inform their physicians about the family history of cancer so their doctors can make the most appropriate screening recommendations for them.   Based on [name]'s family history, we recommended her siblings and all paternal relatives also have genetic counseling and testing. [name] will let us know if we can be of any assistance in coordinating genetic counseling and/or testing for *these family members.   We recommended [name] and her relatives inform their doctors about the strong family history of aneurysms.  [name] said that many of her siblings have already discussed this with their doctors.     FOLLOW-UP: Lastly, we discussed with [name] that cancer genetics is a rapidly advancing field and it is possible that new genetic tests will be appropriate for her and/or her family members in the future. We encouraged her  to remain in contact with cancer genetics on an annual basis so we can update her personal and family histories and let her know of advances in cancer genetics that may benefit this family.   Our contact number was provided. [name] questions were answered to her satisfaction, and she knows she is welcome to call us at anytime with additional questions or concerns.   Ferol Luz, MS, [facility] Certified Genetic Counselor [email or web address]

## 2017-06-14 ENCOUNTER — Encounter: Payer: Self-pay | Admitting: Cardiovascular Disease

## 2017-06-14 ENCOUNTER — Ambulatory Visit: Payer: 59 | Admitting: Cardiovascular Disease

## 2017-06-14 DIAGNOSIS — I10 Essential (primary) hypertension: Secondary | ICD-10-CM | POA: Diagnosis not present

## 2017-06-14 MED ORDER — ENALAPRIL MALEATE 5 MG PO TABS
5.0000 mg | ORAL_TABLET | Freq: Every day | ORAL | 3 refills | Status: DC
Start: 2017-06-14 — End: 2018-06-15

## 2017-06-14 MED ORDER — CLOPIDOGREL BISULFATE 75 MG PO TABS: 37.5000 mg | ORAL_TABLET | ORAL | 3 refills | Status: DC

## 2017-06-14 MED ORDER — NEBIVOLOL HCL 5 MG PO TABS: 5.0000 mg | ORAL_TABLET | Freq: Every day | ORAL | 3 refills | Status: DC

## 2017-06-14 NOTE — Assessment & Plan Note (Signed)
History of essential hypertension with blood pressure measures 106/65. She is on enalapril and Bystolic . Continue current meds at current dosing.

## 2017-06-14 NOTE — Patient Instructions (Signed)
Medication Instructions: Your physician recommends that you continue on your current medications as directed. Please refer to the Current Medication list given to you today.   Follow-Up: Your physician recommends that you schedule a follow-up appointment as needed with Dr. Berry.    

## 2017-06-14 NOTE — Assessment & Plan Note (Signed)
History of hyperlipidemia not on statin therapy followed by her PCP 

## 2017-06-14 NOTE — Assessment & Plan Note (Signed)
History of atypical chest pain with a negative GXT 11/04/15. She's had no recurrent symptoms

## 2017-06-14 NOTE — Progress Notes (Signed)
06/14/2017 Sharon Mendoza   [birth year]  [id number]  Primary Physician Patient, No Pcp Per Primary Cardiologist: Lorretta Harp MD Renae Gloss  HPI:  Sharon Mendoza is a 47 y.o.  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 She was self-referred for evaluation treatment of hypertension and atypical chest pain. I last saw hernia office 9 years ago when she was referred for palpitations during her pregnancy which subsequently resolved. She has a history of hypertension, hyperlipidemia and family history with a brother who died at age 24 of a myocardial infarction and a sister who had an MI at age 4. She does have a strong history of cerebral aneurysms. 5 her 14 siblings have had cerebral aneurysms one of whom died from this. She has had cerebral aneurysm stenting by Dr. Patrecia Pour as well. When I saw her last 2 years ago she was getting atypical chest pain and had a negative GXT. Her symptoms have spontaneously resolved her blood pressure is under much better control.  Current Meds  Medication Sig  . aspirin 81 MG tablet Take 81 mg by mouth daily.  Marland Kitchen BYSTOLIC 5 MG tablet TAKE ONE TABLET BY MOUTH DAILY **NEEDS OFFICE VISIT FOR FURTHER REFILLS**  . cetirizine (ZYRTEC) 10 MG tablet Take 10 mg by mouth daily.  . clopidogrel (PLAVIX) 75 MG tablet Take 37.5 mg by mouth every other day.   . enalapril (VASOTEC) 5 MG tablet Take 1 tablet (5 mg total) by mouth daily. Please schedule appointment for refills  . norethindrone (MICRONOR,CAMILA,ERRIN) 0.35 MG tablet Take 1 tablet (0.35 mg total) by mouth daily.  . Vitamin D, Cholecalciferol, 1000 UNITS TABS Take 2,000 Units by mouth daily.  . [DISCONTINUED] nebivolol (BYSTOLIC) 5 MG tablet Take 1 tablet (5 mg total) by mouth daily. NEED OV.     No Known Allergies  Social History   Socioeconomic History  . Marital status: Single    Spouse name: Not on file  . Number of  children: 1  . Years of education: BS  . Highest education level: Not on file  Occupational History  . Occupation: PHARMACIST    Employer: [employer]  Social Needs  . Financial resource strain: Not on file  . Food insecurity:    Worry: Not on file    Inability: Not on file  . Transportation needs:    Medical: Not on file    Non-medical: Not on file  Tobacco Use  . Smoking status: Never Smoker  . Smokeless tobacco: Never Used  Substance and Sexual Activity  . Alcohol use: No    Alcohol/week: 0.0 oz  . Drug use: No  . Sexual activity: Never    Partners: Male  Lifestyle  . Physical activity:    Days per week: Not on file    Minutes per session: Not on file  . Stress: Not on file  Relationships  . Social connections:    Talks on phone: Not on file    Gets together: Not on file    Attends religious service: Not on file    Active member of club or organization: Not on file    Attends meetings of clubs or organizations: Not on file    Relationship status: Not on file  . Intimate partner violence:    Fear of current or ex partner: Not on file    Emotionally abused: Not on file    Physically abused: Not on file  Forced sexual activity: Not on file  Other Topics Concern  . Not on file  Social History Narrative  . Not on file     Review of Systems: General: negative for chills, fever, night sweats or weight changes.  Cardiovascular: negative for chest pain, dyspnea on exertion, edema, orthopnea, palpitations, paroxysmal nocturnal dyspnea or shortness of breath Dermatological: negative for rash Respiratory: negative for cough or wheezing Urologic: negative for hematuria Abdominal: negative for nausea, vomiting, diarrhea, bright red blood per rectum, melena, or hematemesis Neurologic: negative for visual changes, syncope, or dizziness All other systems reviewed and are otherwise negative except as noted above.    Blood pressure 106/65, pulse 73, height 5\' 3"  (1.6  m), weight 123 lb (55.8 kg).  General appearance: alert and no distress Neck: no adenopathy, no carotid bruit, no JVD, supple, symmetrical, trachea midline and thyroid not enlarged, symmetric, no tenderness/mass/nodules Lungs: clear to auscultation bilaterally Heart: regular rate and rhythm, S1, S2 normal, no murmur, click, rub or gallop Extremities: extremities normal, atraumatic, no cyanosis or edema Pulses: 2+ and symmetric Skin: Skin color, texture, turgor normal. No rashes or lesions Neurologic: Alert and oriented X 3, normal strength and tone. Normal symmetric reflexes. Normal coordination and gait  EKG sinus rhythm at 73 without ST or T-wave changes. I personally reviewed this EKG.  ASSESSMENT AND PLAN:   Essential hypertension History of essential hypertension with blood pressure measures 106/65. She is on enalapril and Bystolic . Continue current meds at current dosing.  Hyperlipidemia History of hyperlipidemia not on statin therapy followed by her PCP      Lorretta Harp MD [facility], [facility] 06/14/2017 4:15 PM

## 2017-06-27 ENCOUNTER — Telehealth: Payer: Self-pay

## 2017-06-27 NOTE — Telephone Encounter (Signed)
   [facility] Pre-operative Risk Assessment    Request for surgical clearance:  1. What type of surgery is being performed? Colonoscopy   2. When is this surgery scheduled? 07/29/2017   3. What type of clearance is required (medical clearance vs. Pharmacy clearance to hold med vs. Both)? Medical clearance 4. Are there any medications that need to be held prior to surgery and how long? Plavix  5. Practice name and name of physician performing surgery? [facility];  Dr. Clarene Essex  6. What is your office phone and fax number? Phone: [phone] Fax: [phone]   7. Anesthesia type (None, local, MAC, general) ? n/a   Sharon Mendoza 06/27/2017, 11:08 AM  _________________________________________________________________   (provider comments below)

## 2017-06-28 ENCOUNTER — Telehealth: Payer: Self-pay | Admitting: Radiology

## 2017-06-28 NOTE — Telephone Encounter (Signed)
Spoke with patient who states her Plavix is prescribed by Dr. Estanislado Pandy for brain aneurysm. She states she gave that information to the scheduler at Dr. Perley Jain office. I called Dr. Perley Jain office and advised Raford Pitcher that the clearance for Plavix will need to come from Dr. Estanislado Pandy. She thanked me for the call.

## 2017-06-28 NOTE — Progress Notes (Signed)
  Refills Plavix 75 mg #30 3 refills-- per Dr Estanislado Pandy  Faxed to [facility]

## 2017-06-28 NOTE — Telephone Encounter (Signed)
   Primary Cardiologist: No primary care provider on file.  Chart reviewed as part of pre-operative protocol coverage. Given past medical history and time since last visit, based on ACC/AHA guidelines, Lavora Brisbon would be at acceptable risk for the planned procedure without further cardiovascular testing.   I will route this recommendation to the requesting party via Epic fax function and remove from pre-op pool.  Please call with questions.  Daune Perch, NP 06/28/2017, 4:25 PM

## 2017-07-04 ENCOUNTER — Telehealth: Payer: Self-pay | Admitting: Obstetrics and Gynecology

## 2017-07-04 DIAGNOSIS — Z9189 Other specified personal risk factors, not elsewhere classified: Secondary | ICD-10-CM

## 2017-07-04 DIAGNOSIS — Z1231 Encounter for screening mammogram for malignant neoplasm of breast: Secondary | ICD-10-CM

## 2017-07-04 NOTE — Telephone Encounter (Signed)
Per genetics recommendation, the patient should be getting yearly breast MRI's given her increased risk of breast cancer. Lifetime risk is 26.8%. She is due for mammogram in 6/19 and MRI in 9/19. Please confirm with the patient that she is okay with [place] scheduling the breast MRI for her. If so, please schedule.

## 2017-07-05 NOTE — Telephone Encounter (Signed)
Left message to call Cara Aguino at [phone]. 

## 2017-07-05 NOTE — Telephone Encounter (Signed)
Patient returned call

## 2017-07-05 NOTE — Telephone Encounter (Signed)
Spoke with patient. Advised of message as seen below from [place]. Patient would like to proceed with screening MRI in September. Advised will need to proceed with screening mammogram in 08/2017. Will order MRI of Breast for 11/2017. Screening mammogram will need to be performed first. Patient is agreeable and will schedule this for June. Advised breast MRI will be kept in work que so that benefits can be checked in 11/2017 for pre-authorization. This will then be scheduled by [place] as it is scheduled based on her menses. Patient is agreeable.  Cc: Lerry Liner  Routing to provider for final review. Patient agreeable to disposition. Will close encounter.

## 2017-07-15 ENCOUNTER — Telehealth: Payer: Self-pay | Admitting: Radiology

## 2017-07-15 NOTE — Progress Notes (Signed)
   Dr Watt Climes office asking if this pt can come off her Plavix For screening colonoscopy-- family hx colon cancer  R ICA aneurysm pipeline stent placed 2016 with Dr Latrelle Dodrill with Dr Estanislado Pandy He has given permission for pt to come off of Plavix 37.5 every other day on 07/24/17 For 07/29/17 procedure Resume Plavix 37.5 mg every other day on 07/30/2017  Pt must continue ASA 81 mg daily throughout   Faxed form to Dr [facility] office [phone] And [phone]

## 2017-08-17 ENCOUNTER — Telehealth (HOSPITAL_COMMUNITY): Payer: Self-pay

## 2017-08-17 ENCOUNTER — Other Ambulatory Visit (HOSPITAL_COMMUNITY): Payer: Self-pay | Admitting: Interventional Radiology

## 2017-08-17 DIAGNOSIS — I729 Aneurysm of unspecified site: Secondary | ICD-10-CM

## 2017-09-05 ENCOUNTER — Other Ambulatory Visit: Payer: Self-pay | Admitting: Obstetrics and Gynecology

## 2017-09-05 DIAGNOSIS — R921 Mammographic calcification found on diagnostic imaging of breast: Secondary | ICD-10-CM

## 2017-09-07 ENCOUNTER — Ambulatory Visit (HOSPITAL_COMMUNITY)
Admission: RE | Admit: 2017-09-07 | Discharge: 2017-09-07 | Disposition: A | Discharge status: Home / Self Care | Payer: 59 | Source: Ambulatory Visit | Attending: Interventional Radiology | Admitting: Interventional Radiology

## 2017-09-07 DIAGNOSIS — I671 Cerebral aneurysm, nonruptured: Secondary | ICD-10-CM | POA: Insufficient documentation

## 2017-09-07 DIAGNOSIS — I729 Aneurysm of unspecified site: Secondary | ICD-10-CM

## 2017-09-12 ENCOUNTER — Ambulatory Visit
Admission: RE | Admit: 2017-09-12 | Discharge: 2017-09-12 | Disposition: A | Discharge status: Home / Self Care | Payer: 59 | Source: Ambulatory Visit | Attending: Obstetrics and Gynecology | Admitting: Obstetrics and Gynecology

## 2017-09-12 DIAGNOSIS — R921 Mammographic calcification found on diagnostic imaging of breast: Secondary | ICD-10-CM

## 2017-09-14 ENCOUNTER — Telehealth (HOSPITAL_COMMUNITY): Payer: Self-pay | Admitting: Radiology

## 2017-09-14 NOTE — Telephone Encounter (Signed)
Patient called and stated she had seen her MRA results on my chart and was not sure if it meant that she has another brain aneurysm or not. She requested that Dr. Estanislado Pandy review these results and get back to her. I gave Dr. Estanislado Pandy her MRA results for review on 09/14/17 @ 0921. JM

## 2017-09-21 ENCOUNTER — Telehealth (HOSPITAL_COMMUNITY): Payer: Self-pay

## 2017-09-21 NOTE — Telephone Encounter (Signed)
Called, left message for pt to return call. AW

## 2017-09-23 ENCOUNTER — Telehealth (HOSPITAL_COMMUNITY): Payer: Self-pay

## 2017-09-23 NOTE — Telephone Encounter (Signed)
Pt agreed to f/u in 1 year with mra. AW  °

## 2017-09-29 NOTE — Telephone Encounter (Signed)
Signing past encounter of attempted phone call 

## 2017-11-30 ENCOUNTER — Telehealth: Payer: Self-pay | Admitting: Obstetrics and Gynecology

## 2017-11-30 NOTE — Telephone Encounter (Signed)
Spoke with patient. Advised yearly breast MRI and annual screening MMG recommended for increased risk of breast cancer. Questions answered. Patient will proceed with breast MRI, patient will return call to [place] to schedule.   Routing to [employer]

## 2017-11-30 NOTE — Telephone Encounter (Signed)
An order was placed by Dr Talbert Nan for an MRI to be scheduled on September 2019.  [facility] called to schedule MRI, but patient advised she wanted to follow up with our office, prior to scheduling.  I called patient to see if she had questions. Patient advises she had a mammogram on 09/12/17, stating "everything is fine". Patient wants to know since mammogram was fine, should she still have the MRI.  Routing to Triage Nurse

## 2017-12-15 NOTE — Telephone Encounter (Signed)
Breast MRI has been pre-certified. Authorization number is [id number] and is valid through 01/15/18. Patient is scheduled for MRI on 12/19/17 with [facility]  Will close encounter

## 2017-12-19 ENCOUNTER — Ambulatory Visit
Admission: RE | Admit: 2017-12-19 | Discharge: 2017-12-19 | Disposition: A | Discharge status: Home / Self Care | Payer: 59 | Source: Ambulatory Visit | Attending: Obstetrics and Gynecology | Admitting: Obstetrics and Gynecology

## 2017-12-19 DIAGNOSIS — Z9189 Other specified personal risk factors, not elsewhere classified: Secondary | ICD-10-CM

## 2017-12-19 MED ORDER — GADOBENATE DIMEGLUMINE 529 MG/ML IV SOLN
11.0000 mL | Freq: Once | INTRAVENOUS | Status: AC | PRN
  Administered 2017-12-19: 11 mL via INTRAVENOUS

## 2018-02-21 ENCOUNTER — Other Ambulatory Visit: Payer: Self-pay

## 2018-02-21 ENCOUNTER — Emergency Department (HOSPITAL_COMMUNITY): Payer: 59

## 2018-02-21 ENCOUNTER — Emergency Department (HOSPITAL_COMMUNITY)
Admission: EM | Admit: 2018-02-21 | Discharge: 2018-02-21 | Disposition: A | Discharge status: Home / Self Care | Payer: 59 | Attending: Emergency Medicine | Admitting: Emergency Medicine

## 2018-02-21 ENCOUNTER — Encounter (HOSPITAL_COMMUNITY): Payer: Self-pay | Admitting: Emergency Medicine

## 2018-02-21 DIAGNOSIS — Z7982 Long term (current) use of aspirin: Secondary | ICD-10-CM | POA: Insufficient documentation

## 2018-02-21 DIAGNOSIS — Z7902 Long term (current) use of antithrombotics/antiplatelets: Secondary | ICD-10-CM | POA: Diagnosis not present

## 2018-02-21 DIAGNOSIS — Z79899 Other long term (current) drug therapy: Secondary | ICD-10-CM | POA: Insufficient documentation

## 2018-02-21 DIAGNOSIS — R079 Chest pain, unspecified: Secondary | ICD-10-CM | POA: Diagnosis not present

## 2018-02-21 DIAGNOSIS — I10 Essential (primary) hypertension: Secondary | ICD-10-CM | POA: Insufficient documentation

## 2018-02-21 LAB — CBC
HCT: 43.2 % (ref 36.0–46.0)
HEMOGLOBIN: 13.1 g/dL (ref 12.0–15.0)
MCH: 28.1 pg (ref 26.0–34.0)
MCHC: 30.3 g/dL (ref 30.0–36.0)
MCV: 92.7 fL (ref 80.0–100.0)
Platelets: 251 10*3/uL (ref 150–400)
RBC: 4.66 MIL/uL (ref 3.87–5.11)
RDW: 14.9 % (ref 11.5–15.5)
WBC: 4.8 10*3/uL (ref 4.0–10.5)
nRBC: 0 % (ref 0.0–0.2)

## 2018-02-21 LAB — COMPREHENSIVE METABOLIC PANEL
ALK PHOS: 57 U/L (ref 38–126)
ALT: 11 U/L (ref 0–44)
AST: 19 U/L (ref 15–41)
Albumin: 4.2 g/dL (ref 3.5–5.0)
Anion gap: 9 (ref 5–15)
BUN: 10 mg/dL (ref 6–20)
CHLORIDE: 103 mmol/L (ref 98–111)
CO2: 26 mmol/L (ref 22–32)
Calcium: 9.7 mg/dL (ref 8.9–10.3)
Creatinine, Ser: 0.88 mg/dL (ref 0.44–1.00)
GFR calc Af Amer: >60 mL/min (ref >60)
GFR calc non Af Amer: >60 mL/min (ref >60)
GLUCOSE: 85 mg/dL (ref 70–99)
POTASSIUM: 4 mmol/L (ref 3.5–5.1)
SODIUM: 138 mmol/L (ref 135–145)
TOTAL PROTEIN: 7.5 g/dL (ref 6.5–8.1)
Total Bilirubin: 0.4 mg/dL (ref 0.3–1.2)

## 2018-02-21 LAB — I-STAT BETA HCG BLOOD, ED (MC, WL, AP ONLY): I-stat hCG, quantitative: <5 m[IU]/mL (ref <5)

## 2018-02-21 LAB — I-STAT TROPONIN, ED
Troponin i, poc: 0 ng/mL (ref 0.00–0.08)
Troponin i, poc: 0 ng/mL (ref 0.00–0.08)

## 2018-02-21 LAB — D-DIMER, QUANTITATIVE (NOT AT ARMC): D DIMER QUANT: 0.35 ug{FEU}/mL (ref 0.00–0.50)

## 2018-02-21 LAB — PROTIME-INR
INR: 0.98
Prothrombin Time: 12.9 seconds (ref 11.4–15.2)

## 2018-02-21 LAB — LIPASE, BLOOD: Lipase: 32 U/L (ref 11–51)

## 2018-02-21 MED ORDER — TRAMADOL HCL 50 MG PO TABS: 50.0000 mg | ORAL_TABLET | Freq: Four times a day (QID) | ORAL | 0 refills | Status: DC | PRN

## 2018-02-21 NOTE — ED Triage Notes (Signed)
Pt in from home with c/o central chest tightness x 2 days. States pain is 4/10 today, c/o some nausea and worse with palpation and deep breaths but especially movement. Takes ASA and Plavix for cerebral aneurysms.

## 2018-02-21 NOTE — ED Provider Notes (Signed)
[facility] EMERGENCY DEPARTMENT Provider Note   CSN: [id number] Arrival date & time: 02/21/18  1309     History   Chief Complaint Chief Complaint  Patient presents with  . Chest Pain    HPI Sharon Mendoza is a 47 y.o. female.  The history is provided by the patient and medical records. No language interpreter was used.  Chest Pain   This is a new problem. The current episode started more than 2 days ago. The problem occurs constantly. The problem has not changed since onset.The pain is associated with breathing, coughing and movement. The pain is present in the substernal region and lateral region. The pain is moderate. The quality of the pain is described as sharp and dull. The pain does not radiate. Duration of episode(s) is 3 days. The symptoms are aggravated by deep breathing. Pertinent negatives include no abdominal pain, no back pain, no cough, no diaphoresis, no dizziness, no exertional chest pressure, no fever, no headaches, no irregular heartbeat, no leg pain, no lower extremity edema, no malaise/fatigue, no nausea, no near-syncope, no palpitations, no shortness of breath, no sputum production, no vomiting and no weakness. She has tried nothing for the symptoms. The treatment provided no relief.    Past Medical History:  Diagnosis Date  . Abnormal pap   . Cerebral aneurysm without rupture   . Chest pain radiating to arm   . Family history of breast cancer   . Family history of colon cancer   . Family history of heart disease   . Gastroesophageal reflux   . Gestational diabetes   . Hyperlipidemia   . Hypertension   . Mitral valve regurgitation   . PONV (postoperative nausea and vomiting)     Patient Active Problem List   Diagnosis Date Noted  . Genetic testing 06/08/2017  . Family history of colon cancer   . Family history of breast cancer   . Medication management 05/13/2015  . Essential hypertension 02/18/2015  . Hyperlipidemia 02/18/2015    . Chest pain 02/18/2015  . Brain aneurysm 11/21/2014  . Pain in joint, shoulder region 12/10/2013  . LBP radiating to left leg 12/10/2013  . Aneurysm, cerebral, nonruptured 12/10/2013  . Fibroid uterus 06/22/2012  . Menorrhagia 06/22/2012  . Dysmenorrhea 06/22/2012    Past Surgical History:  Procedure Laterality Date  . BREAST BIOPSY     age 78 left breast  . CARDIOVASCULAR STRESS TEST  05/19/11   05/19/11 Nuclear stress test ([facility]): Normal study, EF 87%  . CESAREAN SECTION  09/2005  . CT CORONARY CA SCORING  06/23/12   06/23/12 Cardiac CTA ([facility]): Ca score 0. Right dominant. Normal coronaries.  . INTRACRANIAL ANEURYSM REPAIR    . MYOMECTOMY  2005  . RADIOLOGY WITH ANESTHESIA N/A 11/21/2014   Procedure: Embolization;  Surgeon: Luanne Bras, MD;  Location: [facility];  Service: Radiology;  Laterality: N/A;  . US ECHOCARDIOGRAPHY  05/18/11   05/18/11 Echo ([place]): NL chamber size, LV systolic function, wall motion. LVEF 77%. Mild LVH. Mild MR/TR. NL PAP.     OB History    Gravida  1   Para  1   Term      Preterm      AB      Living  1     SAB      TAB      Ectopic      Multiple      Live Births  Home Medications    Prior to Admission medications   Medication Sig Start Date End Date Taking? Authorizing Provider  aspirin 81 MG tablet Take 81 mg by mouth daily.    [provider]  cetirizine (ZYRTEC) 10 MG tablet Take 10 mg by mouth daily.    [provider]  clopidogrel (PLAVIX) 75 MG tablet Take 0.5 tablets (37.5 mg total) by mouth every other day. 06/14/17   Lorretta Harp, MD  enalapril (VASOTEC) 5 MG tablet Take 1 tablet (5 mg total) by mouth daily. 06/14/17   Lorretta Harp, MD  nebivolol (BYSTOLIC) 5 MG tablet Take 1 tablet (5 mg total) by mouth daily. 06/14/17   Lorretta Harp, MD  norethindrone (MICRONOR,CAMILA,ERRIN) 0.35 MG tablet Take 1 tablet (0.35 mg total) by mouth daily.  03/29/17   Salvadore Dom, MD  Vitamin D, Cholecalciferol, 1000 UNITS TABS Take 2,000 Units by mouth daily.    [provider]    Family History Family History  Problem Relation Age of Onset  . Hypertension Mother   . Liver disease Mother   . Colon cancer Father 73  . Throat cancer Father 69  . Heart disease Brother   . Aneurysm Brother   . Heart attack Brother   . Asthma Son   . Aneurysm Sister   . Aneurysm Brother   . Aneurysm Sister   . Heart attack Sister   . Aneurysm Sister 45  . Stroke Maternal Grandmother   . Breast cancer Paternal Aunt 23       metastatic  . Breast cancer Paternal Aunt 68       metastatic  . Colon polyps Brother     Social History Social History   Tobacco Use  . Smoking status: Never Smoker  . Smokeless tobacco: Never Used  Substance Use Topics  . Alcohol use: No    Alcohol/week: 0.0 standard drinks  . Drug use: No     Allergies   Patient has no known allergies.   Review of Systems Review of Systems  Constitutional: Negative for chills, diaphoresis, fatigue, fever and malaise/fatigue.  HENT: Negative for congestion.   Eyes: Negative for visual disturbance.  Respiratory: Negative for cough, sputum production, chest tightness, shortness of breath, wheezing and stridor.   Cardiovascular: Positive for chest pain. Negative for palpitations, leg swelling and near-syncope.  Gastrointestinal: Negative for abdominal pain, constipation, diarrhea, nausea and vomiting.  Genitourinary: Negative for dysuria and flank pain.  Musculoskeletal: Negative for back pain, neck pain and neck stiffness.  Skin: Negative for rash.  Neurological: Negative for dizziness, weakness, light-headedness and headaches.  Psychiatric/Behavioral: Negative for agitation.  All other systems reviewed and are negative.    Physical Exam Updated Vital Signs BP 113/73   Pulse 71   Temp 98.3 F (36.8 C) (Oral)   Ht 5\' 3"  (1.6 m)   Wt 55.8 kg   LMP  01/24/2018   SpO2 100%   BMI 21.79 kg/m   Physical Exam  Constitutional: She is oriented to person, place, and time. She appears well-developed and well-nourished.  Non-toxic appearance. She does not appear ill. No distress.  HENT:  Head: Normocephalic and atraumatic.  Nose: Nose normal.  Mouth/Throat: Oropharynx is clear and moist. No oropharyngeal exudate.  Eyes: Pupils are equal, round, and reactive to light. Conjunctivae are normal.  Neck: Normal range of motion. Neck supple.  Cardiovascular: Normal rate, regular rhythm and intact distal pulses.  No murmur heard. Pulmonary/Chest: Effort normal and breath sounds  normal. No stridor. No tachypnea and no bradypnea. No respiratory distress. She has no decreased breath sounds. She has no wheezes. She has no rhonchi. She has no rales. She exhibits tenderness.    Abdominal: Soft. There is no tenderness.  Musculoskeletal: She exhibits no edema or tenderness.  Lymphadenopathy:    She has no cervical adenopathy.  Neurological: She is alert and oriented to person, place, and time. No sensory deficit. She exhibits normal muscle tone.  Skin: Skin is warm and dry. Capillary refill takes less than 2 seconds. No rash noted. She is not diaphoretic. No erythema.  Psychiatric: She has a normal mood and affect.  Nursing note and vitals reviewed.    ED Treatments / Results  Labs (all labs ordered are listed, but only abnormal results are displayed) Labs Reviewed  CBC  D-DIMER, QUANTITATIVE (NOT AT [facility])  PROTIME-INR  LIPASE, BLOOD  COMPREHENSIVE METABOLIC PANEL  I-STAT TROPONIN, ED  I-STAT BETA HCG BLOOD, ED ([facility], [facility], [facility] ONLY)  I-STAT TROPONIN, ED    EKG EKG Interpretation  Date/Time:  Tuesday February 21 2018 13:19:54 EST Ventricular Rate:  68 PR Interval:    QRS Duration: 87 QT Interval:  371 QTC Calculation: 395 R Axis:   71 Text Interpretation:  Sinus rhythm When comapred to prior, no significaint changes seen.  No STEMI  Confirmed by Antony Blackbird [phone]) on 02/21/2018 1:26:21 PM   Radiology Dg Chest 2 View  Result Date: 02/21/2018 CLINICAL DATA:  Chest pain for 2 days EXAM: CHEST - 2 VIEW COMPARISON:  None. FINDINGS: The heart size and mediastinal contours are within normal limits. Both lungs are clear. The visualized skeletal structures are unremarkable. IMPRESSION: No active cardiopulmonary disease. Electronically Signed   By: Inez Catalina M.D.   On: 02/21/2018 14:10    Procedures Procedures (including critical care time)  Medications Ordered in ED Medications - No data to display   Initial Impression / Assessment and Plan / ED Course  I have reviewed the triage vital signs and the nursing notes.  Pertinent labs & imaging results that were available during my care of the patient were reviewed by me and considered in my medical decision making (see chart for details).     Sharon Mendoza is a 47 y.o. female with a past medical history significant for hypertension, hyperlipidemia, and cerebral aneurysm status post stenting on aspirin and Plavix who presents with chest pain.  Patient reports that for the last 3 days, she has been having constant but fluctuating chest pain across her chest.  She thinks it is muscular skeletal as it is worsened with deep breathing or any palpation however she denies any trauma or lifting heavy things to her knowledge.  She says that she has not had this pain before.  She describes it as a 4-10 on arrival but it has been as bad as a 9 out of 10 at times.  She reports it is worsened with deep breathing.  She denies significant fevers, chills, congestion, cough.  She denies palpitations.  She denies vomiting but does report some mild nausea.  She denies any leg pain, leg swelling, or history of any DVT/PE.  She denies any abdominal discomfort, hemoptysis, or other recent medication changes.  She denies significant shortness of breath with the pain.  Pain does not radiate at this  time.  On exam, patient has tenderness across her chest.  No murmur was appreciated.  Patient had symmetric radial pulses.  Patient had clear lungs.  No  focal neurologic deficit seen.  Patient had symmetric pulses in lower extreme 80s.  We discussed how with her history of cerebral aneurysms, she is more risk for an aortic aneurysm however she denies the pain radiating to her back at this time, denies it being tearing, and does not feel this is her aorta.  She thinks this is likely muscular skeletal.  Patient agreed to have a d-dimer given the pleuritic chest discomfort to rule out a PE.  We will have chest x-ray and other screening labs including delta troponin.  EKG showed no STEMI.  Anticipate reassessment after the patient's work-up.  Patient's initial work-up was overall reassuring.  Initial troponin was negative.  Lipase not elevated.  D-dimer negative.  CBC and CMP reassuring.    Patient is awaiting delta troponin.  If second troponin is negative, patient will be stable for discharge home.  I suspect is likely muscular skeletal.  Patient agrees with plan of care and agrees with avoiding CT scan at this time.  Anticipate discharge if second troponin is negative.  Care transferred to Dr. Vallery Ridge while awaiting for second troponin.    Final Clinical Impressions(s) / ED Diagnoses   Final diagnoses:  Nonspecific chest pain    ED Discharge Orders         Ordered    traMADol (ULTRAM) 50 MG tablet  Every 6 hours PRN     02/21/18 1738          Clinical Impression: 1. Nonspecific chest pain     Disposition: Care transferred to Dr. Vallery Ridge while awaiting for second troponin.  Anticipate discharge  This note was prepared with assistance of Dragon voice recognition software. Occasional wrong-word or sound-a-like substitutions may have occurred due to the inherent limitations of voice recognition software.     Almeta Geisel, Gwenyth Allegra, MD 02/21/18 Joen Laura

## 2018-02-21 NOTE — ED Notes (Signed)
Patient verbalizes understanding of discharge instructions. Opportunity for questioning and answers were provided. Armband removed by staff, pt discharged from ED. Ambulated out into lobby

## 2018-02-21 NOTE — Discharge Instructions (Addendum)
1.  Follow-up with your doctor as soon as possible.  If you do not have a doctor use the referral number in your discharge instructions to find one. 2.  You may try tramadol for pain if needed.  You may also take acetaminophen either alone or with tramadol. 3.  Return to the emergency department if symptoms are worsening or changing.

## 2018-04-02 ENCOUNTER — Other Ambulatory Visit: Payer: Self-pay | Admitting: Obstetrics and Gynecology

## 2018-04-05 ENCOUNTER — Other Ambulatory Visit: Payer: Self-pay | Admitting: Obstetrics and Gynecology

## 2018-04-05 NOTE — Telephone Encounter (Signed)
Medication refill request: Micronor Last AEX:  03/10/2017 Next AEX: 04/13/2018 Last MMG (if hormonal medication request): 12/19/2017 Birads 1 negative Refill authorized: Micronor #[id number] [id number]  Please refill if appropriate until patient's aex.

## 2018-04-10 NOTE — Progress Notes (Signed)
48 y.o. G1P1 Single Black or African American Not Hispanic or Latino female here for annual exam.  She is on POP's, no menses. She was having irregular cycles prior to restarting the pill last year. She has a h/o a small fibroid uterus. H/O aneurysm, s/p stent placement, on Plavix.   She is having occasional hot flashes and night sweats, tolerable. No bleeding on the POP. Not sexually active.   She has a FH of colon and breast cancer. Negative genetic testing. She should be getting colonoscopies every 5 years. Risk of breast cancer is 26.8%.  She has focal hair loss in the last year, a little better with over the counter products.     No LMP recorded. (Menstrual status: Oral contraceptives).          Sexually active: No.  The current method of family planning is POP.    Exercising: Yes.    walking, gym Smoker:  no  Health Maintenance: Pap:  03-09-16 WNL NEG HR HPV 06-22-12 WNL NEG HR HPV  History of abnormal Pap:  Yes- repeat PAP normal per patient  MMG:  09/12/2017 Birads 2 benign, bilateral breast MRI 12/19/2017 WNL Colonoscopy:  2019 WNL per patient, done with [facility]:   Never TDaP:  12-04-14  Gardasil: N/A   reports that she has never smoked. She has never used smokeless tobacco. She reports that she does not drink alcohol or use drugs. She has an 28year old son, raising him on her own. 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 She is one of 14 children, very close with her siblings. Parents have passed. 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  Past Medical History:  Diagnosis Date  . Abnormal pap   . Cerebral aneurysm without rupture   . Chest pain radiating to arm   . Family history of breast cancer   . Family history of colon cancer   . Family history of heart disease   . Gastroesophageal reflux   . Gestational diabetes   . Hyperlipidemia   . Hypertension   . Mitral  valve regurgitation   . PONV (postoperative nausea and vomiting)     Past Surgical History:  Procedure Laterality Date  . BREAST BIOPSY     age 42 left breast  . CARDIOVASCULAR STRESS TEST  05/19/11   05/19/11 Nuclear stress test ([facility]): Normal study, EF 87%  . CESAREAN SECTION  09/2005  . CT CORONARY CA SCORING  06/23/12   06/23/12 Cardiac CTA ([facility]): Ca score 0. Right dominant. Normal coronaries.  . INTRACRANIAL ANEURYSM REPAIR    . MYOMECTOMY  2005  . RADIOLOGY WITH ANESTHESIA N/A 11/21/2014   Procedure: Embolization;  Surgeon: Luanne Bras, MD;  Location: [facility];  Service: Radiology;  Laterality: N/A;  . US ECHOCARDIOGRAPHY  05/18/11   05/18/11 Echo ([facility]): NL chamber size, LV systolic function, wall motion. LVEF 77%. Mild LVH. Mild MR/TR. NL PAP.    Current Outpatient Medications  Medication Sig Dispense Refill  . aspirin 81 MG tablet Take 81 mg by mouth daily.    . cetirizine (ZYRTEC) 10 MG tablet Take 10 mg by mouth daily.    . clopidogrel (PLAVIX) 75 MG tablet Take 0.5 tablets (37.5 mg total) by mouth every other day. 25 tablet 3  . enalapril (VASOTEC) 5 MG tablet Take 1 tablet (5 mg total) by mouth daily. 90 tablet 3  .  nebivolol (BYSTOLIC) 5 MG tablet Take 1 tablet (5 mg total) by mouth daily. 90 tablet 3  . norethindrone (MICRONOR,CAMILA,ERRIN) 0.35 MG tablet TAKE ONE TABLET BY MOUTH DAILY 1 Package 0  . Vitamin D, Cholecalciferol, 1000 UNITS TABS Take 2,000 Units by mouth daily.     No current facility-administered medications for this visit.     Family History  Problem Relation Age of Onset  . Hypertension Mother   . Liver disease Mother   . Colon cancer Father 64  . Throat cancer Father 30  . Heart disease Brother   . Aneurysm Brother   . Heart attack Brother   . Asthma Son   . Aneurysm Sister   . Aneurysm Brother   . Aneurysm Sister   . Heart attack Sister   . Aneurysm Sister 39  . Stroke Maternal Grandmother   . Breast  cancer Paternal Aunt 36       metastatic  . Breast cancer Paternal Aunt 67       metastatic  . Colon polyps Brother     Review of Systems  Constitutional: Negative.   HENT: Positive for nosebleeds.   Eyes: Negative.   Respiratory: Negative.   Cardiovascular: Negative.   Gastrointestinal: Negative.   Endocrine: Negative.   Genitourinary: Negative.   Musculoskeletal: Negative.   Skin:       Hair loss  Allergic/Immunologic: Negative.   Neurological: Negative.   Psychiatric/Behavioral: Negative.     Exam:   BP 106/72 (BP Location: Right Arm, Patient Position: Sitting, Cuff Size: Normal)   Pulse 72   Ht 5\' 3"  (1.6 m)   Wt 115 lb 3.2 oz (52.3 kg)   BMI 20.41 kg/m   Weight change: @WEIGHTCHANGE @ Height:   Height: 5\' 3"  (160 cm)  Ht Readings from Last 3 Encounters:  04/13/18 5\' 3"  (1.6 m)  02/21/18 5\' 3"  (1.6 m)  06/14/17 5\' 3"  (1.6 m)    General appearance: alert, cooperative and appears stated age Head: Normocephalic, without obvious abnormality, atraumatic Neck: no adenopathy, supple, symmetrical, trachea midline and thyroid normal to inspection and palpation Lungs: clear to auscultation bilaterally Cardiovascular: regular rate and rhythm Breasts: normal appearance, no masses or tenderness Abdomen: soft, non-tender; non distended,  no masses,  no organomegaly Extremities: extremities normal, atraumatic, no cyanosis or edema Skin: Skin color, texture, turgor normal. No rashes or lesions. Focal hair loss on the right side of her head.  Lymph nodes: Cervical, supraclavicular, and axillary nodes normal. No abnormal inguinal nodes palpated Neurologic: Grossly normal   Pelvic: External genitalia:  no lesions              Urethra:  normal appearing urethra with no masses, tenderness or lesions              Bartholins and Skenes: normal                 Vagina: normal appearing vagina with normal color and discharge, no lesions              Cervix: no lesions                Bimanual Exam:  Uterus:  slightly irregular contour, c/w fibroid uterus, not appreciably enlarged              Adnexa: no mass, fullness, tenderness               Rectovaginal: Confirms  Anus:  normal sphincter tone, no lesions  Chaperone was present for exam.  A:  Well Woman with normal exam  Fibroid uterus  H/O gestational DM  H/O aneurysm, s/p stent placement, on plavix  FH of colon and breast cancer  Risk of breast cancer is 26.8%  Hair loss, focal  P:   Pap next year  Lab work is utd other than screening for DM  HgbA1C, TSH  Discussed breast self exam  Discussed calcium and vit D intake  Mammogram in 6/20  Breast MRI in 9/20  Colonoscopy is UTD  Will continue POP's this year, will recheck [facility] next year

## 2018-04-13 ENCOUNTER — Ambulatory Visit: Payer: 59 | Admitting: Obstetrics and Gynecology

## 2018-04-13 ENCOUNTER — Encounter: Payer: Self-pay | Admitting: Obstetrics and Gynecology

## 2018-04-13 ENCOUNTER — Other Ambulatory Visit: Payer: Self-pay

## 2018-04-13 VITALS — BP 106/72 | HR 72 | Ht 63.0 in | Wt 115.2 lb

## 2018-04-13 DIAGNOSIS — Z8632 Personal history of gestational diabetes: Secondary | ICD-10-CM | POA: Diagnosis not present

## 2018-04-13 DIAGNOSIS — Z800 Family history of malignant neoplasm of digestive organs: Secondary | ICD-10-CM

## 2018-04-13 DIAGNOSIS — Z9189 Other specified personal risk factors, not elsewhere classified: Secondary | ICD-10-CM

## 2018-04-13 DIAGNOSIS — Z01419 Encounter for gynecological examination (general) (routine) without abnormal findings: Secondary | ICD-10-CM

## 2018-04-13 DIAGNOSIS — D259 Leiomyoma of uterus, unspecified: Secondary | ICD-10-CM

## 2018-04-13 DIAGNOSIS — L659 Nonscarring hair loss, unspecified: Secondary | ICD-10-CM

## 2018-04-13 DIAGNOSIS — Z803 Family history of malignant neoplasm of breast: Secondary | ICD-10-CM

## 2018-04-13 MED ORDER — NORETHINDRONE 0.35 MG PO TABS: 1.0000 | ORAL_TABLET | Freq: Every day | ORAL | 3 refills | Status: DC

## 2018-04-13 NOTE — Patient Instructions (Signed)
EXERCISE AND DIET:  We recommended that you start or continue a regular exercise program for good health. Regular exercise means any activity that makes your heart beat faster and makes you sweat.  We recommend exercising at least 30 minutes per day at least 3 days a week, preferably 4 or 5.  We also recommend a diet low in fat and sugar.  Inactivity, poor dietary choices and obesity can cause diabetes, heart attack, stroke, and kidney damage, among others.    ALCOHOL AND SMOKING:  Women should limit their alcohol intake to no more than 7 drinks/beers/glasses of wine (combined, not each!) per week. Moderation of alcohol intake to this level decreases your risk of breast cancer and liver damage. And of course, no recreational drugs are part of a healthy lifestyle.  And absolutely no smoking or even second hand smoke. Most people know smoking can cause heart and lung diseases, but did you know it also contributes to weakening of your bones? Aging of your skin?  Yellowing of your teeth and nails?  CALCIUM AND VITAMIN D:  Adequate intake of calcium and Vitamin D are recommended.  The recommendations for exact amounts of these supplements seem to change often, but generally speaking 1,000 mg of calcium (between diet and supplement) and 800 units of Vitamin D per day seems prudent. Certain women may benefit from higher intake of Vitamin D.  If you are among these women, your doctor will have told you during your visit.    PAP SMEARS:  Pap smears, to check for cervical cancer or precancers,  have traditionally been done yearly, although recent scientific advances have shown that most women can have pap smears less often.  However, every woman still should have a physical exam from her gynecologist every year. It will include a breast check, inspection of the vulva and vagina to check for abnormal growths or skin changes, a visual exam of the cervix, and then an exam to evaluate the size and shape of the uterus and  ovaries.  And after 50 years of age, a rectal exam is indicated to check for rectal cancers. We will also provide age appropriate advice regarding health maintenance, like when you should have certain vaccines, screening for sexually transmitted diseases, bone density testing, colonoscopy, mammograms, etc.   MAMMOGRAMS:  All women over 40 years old should have a yearly mammogram. Many facilities now offer a "3D" mammogram, which may cost around $50 extra out of pocket. If possible,  we recommend you accept the option to have the 3D mammogram performed.  It both reduces the number of women who will be called back for extra views which then turn out to be normal, and it is better than the routine mammogram at detecting truly abnormal areas.    COLON CANCER SCREENING: Now recommend starting at age 45. At this time colonoscopy is not covered for routine screening until 50. There are take home tests that can be done between 45-49.   COLONOSCOPY:  Colonoscopy to screen for colon cancer is recommended for all women at age 50.  We know, you hate the idea of the prep.  We agree, BUT, having colon cancer and not knowing it is worse!!  Colon cancer so often starts as a polyp that can be seen and removed at colonscopy, which can quite literally save your life!  And if your first colonoscopy is normal and you have no family history of colon cancer, most women don't have to have it again for   10 years.  Once every ten years, you can do something that may end up saving your life, right?  We will be happy to help you get it scheduled when you are ready.  Be sure to check your insurance coverage so you understand how much it will cost.  It may be covered as a preventative service at no cost, but you should check your particular policy.      Breast Self-Awareness Breast self-awareness means being familiar with how your breasts look and feel. It involves checking your breasts regularly and reporting any changes to your  health care provider. Practicing breast self-awareness is important. A change in your breasts can be a sign of a serious medical problem. Being familiar with how your breasts look and feel allows you to find any problems early, when treatment is more likely to be successful. All women should practice breast self-awareness, including women who have had breast implants. How to do a breast self-exam One way to learn what is normal for your breasts and whether your breasts are changing is to do a breast self-exam. To do a breast self-exam: Look for Changes  1. Remove all the clothing above your waist. 2. Stand in front of a mirror in a room with good lighting. 3. Put your hands on your hips. 4. Push your hands firmly downward. 5. Compare your breasts in the mirror. Look for differences between them (asymmetry), such as: ? Differences in shape. ? Differences in size. ? Puckers, dips, and bumps in one breast and not the other. 6. Look at each breast for changes in your skin, such as: ? Redness. ? Scaly areas. 7. Look for changes in your nipples, such as: ? Discharge. ? Bleeding. ? Dimpling. ? Redness. ? A change in position. Feel for Changes Carefully feel your breasts for lumps and changes. It is best to do this while lying on your back on the floor and again while sitting or standing in the shower or tub with soapy water on your skin. Feel each breast in the following way:  Place the arm on the side of the breast you are examining above your head.  Feel your breast with the other hand.  Start in the nipple area and make  inch (2 cm) overlapping circles to feel your breast. Use the pads of your three middle fingers to do this. Apply light pressure, then medium pressure, then firm pressure. The light pressure will allow you to feel the tissue closest to the skin. The medium pressure will allow you to feel the tissue that is a little deeper. The firm pressure will allow you to feel the tissue  close to the ribs.  Continue the overlapping circles, moving downward over the breast until you feel your ribs below your breast.  Move one finger-width toward the center of the body. Continue to use the  inch (2 cm) overlapping circles to feel your breast as you move slowly up toward your collarbone.  Continue the up and down exam using all three pressures until you reach your armpit.  Write Down What You Find  Write down what is normal for each breast and any changes that you find. Keep a written record with breast changes or normal findings for each breast. By writing this information down, you do not need to depend only on memory for size, tenderness, or location. Write down where you are in your menstrual cycle, if you are still menstruating. If you are having trouble noticing differences   in your breasts, do not get discouraged. With time you will become more familiar with the variations in your breasts and more comfortable with the exam. How often should I examine my breasts? Examine your breasts every month. If you are breastfeeding, the best time to examine your breasts is after a feeding or after using a breast pump. If you menstruate, the best time to examine your breasts is 5-7 days after your period is over. During your period, your breasts are lumpier, and it may be more difficult to notice changes. When should I see my health care provider? See your health care provider if you notice:  A change in shape or size of your breasts or nipples.  A change in the skin of your breast or nipples, such as a reddened or scaly area.  Unusual discharge from your nipples.  A lump or thick area that was not there before.  Pain in your breasts.  Anything that concerns you.  

## 2018-04-15 LAB — HEMOGLOBIN A1C
ESTIMATED AVERAGE GLUCOSE: 111 mg/dL
Hgb A1c MFr Bld: 5.5 % (ref 4.8–5.6)

## 2018-04-15 LAB — TSH: TSH: 0.993 u[IU]/mL (ref 0.450–4.500)

## 2018-04-17 DIAGNOSIS — Z8679 Personal history of other diseases of the circulatory system: Secondary | ICD-10-CM | POA: Insufficient documentation

## 2018-04-19 ENCOUNTER — Other Ambulatory Visit: Payer: Self-pay | Admitting: Emergency Medicine

## 2018-04-19 DIAGNOSIS — Z1231 Encounter for screening mammogram for malignant neoplasm of breast: Secondary | ICD-10-CM

## 2018-04-19 NOTE — Addendum Note (Signed)
Addended by: Michele Mcalpine on: 04/19/2018 08:16 AM   Modules accepted: Orders

## 2018-06-15 ENCOUNTER — Other Ambulatory Visit: Payer: Self-pay | Admitting: Cardiovascular Disease

## 2018-06-15 DIAGNOSIS — I10 Essential (primary) hypertension: Secondary | ICD-10-CM

## 2018-08-07 ENCOUNTER — Telehealth: Payer: Self-pay | Admitting: Student

## 2018-08-07 NOTE — Telephone Encounter (Signed)
Received refill request for Plavix.  Called patient at 1132 to confirm dose- patient states that she is taking Plavix 37.5 mg once every other day.  Faxed prescription request [phone]) at 1130- Plavix 75 mg tablets, take 1/2 tablet by mouth once every other day, dispense 15 tablets with 2 refills.   Bea Graff Louk, PA-C 08/07/2018, 11:44 AM

## 2018-08-08 ENCOUNTER — Telehealth: Payer: Self-pay | Admitting: Cardiovascular Disease

## 2018-08-08 ENCOUNTER — Ambulatory Visit (INDEPENDENT_AMBULATORY_CARE_PROVIDER_SITE_OTHER): Payer: 59 | Admitting: Cardiology

## 2018-08-08 ENCOUNTER — Telehealth: Payer: Self-pay

## 2018-08-08 ENCOUNTER — Encounter: Payer: Self-pay | Admitting: Cardiology

## 2018-08-08 ENCOUNTER — Other Ambulatory Visit: Payer: Self-pay

## 2018-08-08 VITALS — BP 124/80 | HR 80 | Ht 63.0 in | Wt 123.4 lb

## 2018-08-08 DIAGNOSIS — M7918 Myalgia, other site: Secondary | ICD-10-CM | POA: Diagnosis not present

## 2018-08-08 DIAGNOSIS — I10 Essential (primary) hypertension: Secondary | ICD-10-CM | POA: Diagnosis not present

## 2018-08-08 NOTE — Telephone Encounter (Signed)
New Message   Patient states she has sharp pains in her upper left back when she breathe's or move around and wants to speak to a nurse about it.

## 2018-08-08 NOTE — Telephone Encounter (Signed)
Message received from Triage. Reviewed patient record. Returned call to patient.  She describes a bout of chest pain about 2 months ago that lasted for 2 days and then went away. She did not seek care. She thought it was due to exercise.  However, chest pain started again last night while driving home.  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 The pain is mostly in her back. The pain is not constant, but is worse when she takes a deep breath in or moves. The pain sounds atypical, but she has a history and family history of cerebral aneurysms.   No SOB, no leg swelling, no N/Vor diaphoresis. She is worried about a blood clot. No history of PE.  She does not want to present to the ER.  She is setup with an in-office visit with DOD Dr. Marlou Porch this afternoon.  Tami Lin Duke, PA-C 08/08/2018, 10:36 AM

## 2018-08-08 NOTE — Telephone Encounter (Signed)
Per Doreene Adas, PA to schedule pt in a DOD slot today due to pt having chest pain. I have scheduled pt to come into the ch st office to see Dr. Marlou Porch. Pt complained of chest pain, back pain and shoulder pain on the left side. I have screened pt over the phone for covid symptoms. She denied of any. I instructed her to wear a mask and that she will go through screening again when she gets to the office. Pt verbalized understanding and thanked me for the call.

## 2018-08-08 NOTE — Telephone Encounter (Signed)
Spoke with pt who states, a couple of weeks ago she had experience left side back and shoulder pain when she breath in and moving around. She report after two days, symptoms went away. Pt report as of yesterday, symptoms has return and it causing her to lose sleep. Pt denies SOB or any other symptoms. Will route message to triage PA for recommendations.

## 2018-08-08 NOTE — Patient Instructions (Signed)
Medication Instructions:  The current medical regimen is effective;  continue present plan and medications.  If you need a refill on your cardiac medications before your next appointment,  Please contact your pharmacy.  If you have labs (blood work) drawn today and your tests are completely normal, you  Follow-Up: Follow up as previously scheduled with Dr Gwenlyn Found.  Thank you for choosing [facility]!!

## 2018-08-08 NOTE — Progress Notes (Signed)
Cardiology Office Note:    Date:  08/08/2018   ID:  Sharon Mendoza, DOB December 25, 1970, MRN [id number]  PCP:  Patient, No Pcp Per  Cardiologist:  Candee Furbish, MD  Electrophysiologist:  None   Referring MD: No ref. provider found   Chest pain evaluation  History of Present Illness:    Sharon Mendoza is a 48 y.o. female with ER visit February 21, 2018.  She had pleuritic chest discomfort at that time, chest x-ray and troponins were unremarkable.  D-dimer was normal.  Lipase was normal.  EKG today shows sinus rhythm with no other abnormalities.  Excellent.  Prior treadmill test was normal in 2017.  Back pain/chest pain started last month when she started to exercise more.  Began as a pain in her back between her shoulder blades and sometimes was worse when she took a deep breath.  After about 2 days it went away.  Yesterday she felt it once again sharp under her left back radiated up to her left shoulder.  She was concerned about this.  No recent long travels no edema no thrombotic history.  She is actually on both aspirin and Plavix as she has had prior cerebral aneurysms coiled.  Past Medical History:  Diagnosis Date  . Abnormal pap   . Cerebral aneurysm without rupture   . Chest pain radiating to arm   . Family history of breast cancer   . Family history of colon cancer   . Family history of heart disease   . Gastroesophageal reflux   . Gestational diabetes   . Hyperlipidemia   . Hypertension   . Mitral valve regurgitation   . PONV (postoperative nausea and vomiting)     Past Surgical History:  Procedure Laterality Date  . BREAST BIOPSY     age 24 left breast  . CARDIOVASCULAR STRESS TEST  05/19/11   05/19/11 Nuclear stress test ([facility]): Normal study, EF 87%  . CESAREAN SECTION  09/2005  . CT CORONARY CA SCORING  06/23/12   06/23/12 Cardiac CTA ([facility]): Ca score 0. Right dominant. Normal coronaries.  . INTRACRANIAL ANEURYSM REPAIR    . MYOMECTOMY  2005   . RADIOLOGY WITH ANESTHESIA N/A 11/21/2014   Procedure: Embolization;  Surgeon: Luanne Bras, MD;  Location: [facility];  Service: Radiology;  Laterality: N/A;  . US ECHOCARDIOGRAPHY  05/18/11   05/18/11 Echo ([facility]): NL chamber size, LV systolic function, wall motion. LVEF 77%. Mild LVH. Mild MR/TR. NL PAP.    Current Medications: Current Meds  Medication Sig  . aspirin 81 MG tablet Take 81 mg by mouth daily.  Marland Kitchen BYSTOLIC 5 MG tablet TAKE ONE TABLET BY MOUTH DAILY  . cetirizine (ZYRTEC) 10 MG tablet Take 10 mg by mouth daily.  . clopidogrel (PLAVIX) 75 MG tablet Take 0.5 tablets (37.5 mg total) by mouth every other day.  . enalapril (VASOTEC) 5 MG tablet TAKE ONE TABLET BY MOUTH DAILY  . norethindrone (MICRONOR,CAMILA,ERRIN) 0.35 MG tablet Take 1 tablet (0.35 mg total) by mouth daily.  . Vitamin D, Cholecalciferol, 1000 UNITS TABS Take 2,000 Units by mouth daily.     Allergies:   Patient has no known allergies.   Social History   Socioeconomic History  . Marital status: Single    Spouse name: Not on file  . Number of children: 1  . Years of education: BS  . Highest education level: Not on file  Occupational History  . Occupation: PHARMACIST    Employer: [employer]  Social Needs  . Financial resource strain: Not on file  . Food insecurity:    Worry: Not on file    Inability: Not on file  . Transportation needs:    Medical: Not on file    Non-medical: Not on file  Tobacco Use  . Smoking status: Never Smoker  . Smokeless tobacco: Never Used  Substance and Sexual Activity  . Alcohol use: No    Alcohol/week: 0.0 standard drinks  . Drug use: No  . Sexual activity: Not Currently    Partners: Male    Birth control/protection: Pill  Lifestyle  . Physical activity:    Days per week: Not on file    Minutes per session: Not on file  . Stress: Not on file  Relationships  . Social connections:    Talks on phone: Not on file    Gets together: Not on file     Attends religious service: Not on file    Active member of club or organization: Not on file    Attends meetings of clubs or organizations: Not on file    Relationship status: Not on file  Other Topics Concern  . Not on file  Social History Narrative  . Not on file     Family History: The patient's family history includes Aneurysm in her brother, brother, sister, and sister; Aneurysm (age of onset: 63) in her sister; Asthma in her son; Breast cancer (age of onset: 48) in her paternal aunt and paternal aunt; Colon cancer (age of onset: 91) in her father; Colon polyps in her brother; Heart attack in her brother and sister; Heart disease in her brother; Hypertension in her mother; Liver disease in her mother; Stroke in her maternal grandmother; Throat cancer (age of onset: 46) in her father.  ROS:   Please see the history of present illness.    No fevers, no chills no orthopnea no PND all other systems reviewed and are negative.  EKGs/Labs/Other Studies Reviewed:    The following studies were reviewed today: 11/04/2015-treadmill test was normal.  EKG:  EKG is  ordered today.  The ekg ordered today demonstrates sinus rhythm 72 with no other abnormalities  Recent Labs: 02/21/2018: ALT 11; BUN 10; Creatinine, Ser 0.88; Hemoglobin 13.1; Platelets 251; Potassium 4.0; Sodium 138 04/13/2018: TSH 0.993  Recent Lipid Panel    Component Value Date/Time   CHOL 155 12/16/2015 0851   TRIG 74 12/16/2015 0851   HDL 61 12/16/2015 0851   CHOLHDL 2.5 12/16/2015 0851   VLDL 15 12/16/2015 0851   LDLCALC 79 12/16/2015 0851    Physical Exam:    VS:  BP 124/80   Pulse 80   Ht 5\' 3"  (1.6 m)   Wt 123 lb 6.4 oz (56 kg)   SpO2 99%   BMI 21.86 kg/m     Wt Readings from Last 3 Encounters:  08/08/18 123 lb 6.4 oz (56 kg)  04/13/18 115 lb 3.2 oz (52.3 kg)  02/21/18 123 lb (55.8 kg)     GEN:  Well nourished, well developed in no acute distress HEENT: Normal NECK: No JVD; No carotid bruits  LYMPHATICS: No lymphadenopathy CARDIAC: RRR, no murmurs, rubs, gallops RESPIRATORY:  Clear to auscultation without rales, wheezing or rhonchi  ABDOMEN: Soft, non-tender, non-distended MUSCULOSKELETAL:  No edema; No deformity, point tenderness noted left rhomboid region mid scapular SKIN: Warm and dry NEUROLOGIC:  Alert and oriented x 3 PSYCHIATRIC:  Normal affect   ASSESSMENT:    1. Myofascial pain  2. Essential hypertension    PLAN:    In order of problems listed above:  Musculoskeletal pain - Low suspicion for pulmonary embolism, no prior DVT history.  No edema.  Prior d-dimer was normal.  EKG unremarkable.  Normal oxygen saturation.  Able to duplicate the area of discomfort where her rhomboid musculature resides.  Continue with massage.   Medication Adjustments/Labs and Tests Ordered: Current medicines are reviewed at length with the patient today.  Concerns regarding medicines are outlined above.  Orders Placed This Encounter  Procedures  . EKG 12-Lead   No orders of the defined types were placed in this encounter.   Patient Instructions  Medication Instructions:  The current medical regimen is effective;  continue present plan and medications.  If you need a refill on your cardiac medications before your next appointment,  Please contact your pharmacy.  If you have labs (blood work) drawn today and your tests are completely normal, you  Follow-Up: Follow up as previously scheduled with Dr Gwenlyn Found.  Thank you for choosing [facility]!!        Signed, Candee Furbish, MD  08/08/2018 3:14 PM    [place]

## 2018-09-17 ENCOUNTER — Other Ambulatory Visit: Payer: Self-pay | Admitting: Cardiovascular Disease

## 2018-09-17 DIAGNOSIS — I10 Essential (primary) hypertension: Secondary | ICD-10-CM

## 2018-09-18 NOTE — Telephone Encounter (Signed)
Rx(s) sent to pharmacy electronically.  

## 2018-09-20 ENCOUNTER — Telehealth: Payer: Self-pay | Admitting: Obstetrics and Gynecology

## 2018-09-20 NOTE — Telephone Encounter (Signed)
The patient sent a care package of health products, I called to thank her.

## 2018-10-09 ENCOUNTER — Other Ambulatory Visit (HOSPITAL_COMMUNITY): Payer: Self-pay | Admitting: Interventional Radiology

## 2018-10-09 DIAGNOSIS — I729 Aneurysm of unspecified site: Secondary | ICD-10-CM

## 2018-10-23 ENCOUNTER — Other Ambulatory Visit (HOSPITAL_COMMUNITY): Payer: Self-pay

## 2018-10-23 ENCOUNTER — Other Ambulatory Visit: Payer: Self-pay

## 2018-10-23 ENCOUNTER — Ambulatory Visit (HOSPITAL_COMMUNITY)
Admission: RE | Admit: 2018-10-23 | Discharge: 2018-10-23 | Disposition: A | Discharge status: Home / Self Care | Payer: 59 | Source: Ambulatory Visit | Attending: Interventional Radiology | Admitting: Interventional Radiology

## 2018-10-23 DIAGNOSIS — I729 Aneurysm of unspecified site: Secondary | ICD-10-CM | POA: Insufficient documentation

## 2018-10-23 LAB — PLATELET INHIBITION P2Y12: Platelet Function  P2Y12: 141 [PRU] — ABNORMAL LOW (ref 182–335)

## 2018-10-24 ENCOUNTER — Other Ambulatory Visit: Payer: Self-pay | Admitting: Internal Medicine

## 2018-10-24 ENCOUNTER — Telehealth (HOSPITAL_COMMUNITY): Payer: Self-pay

## 2018-10-24 DIAGNOSIS — Z1231 Encounter for screening mammogram for malignant neoplasm of breast: Secondary | ICD-10-CM

## 2018-10-24 NOTE — Telephone Encounter (Signed)
Pt agreed to f/u in 1 year with mra head w/o and to continue current blood thinner as prescribed. AW

## 2018-11-02 ENCOUNTER — Telehealth: Payer: Self-pay | Admitting: Obstetrics and Gynecology

## 2018-11-02 NOTE — Telephone Encounter (Signed)
Call placed to patient to follow up with breast MRI. Per referral notes from Dr Talbert Nan, patient will need to have breast MRI one year from last one, which was 12/19/2017.  Patient advised she recently scheduled a mammogram on 12/07/2018, and she will call [facility] to also scheduled recommended MRI.  Routing to Dr Talbert Nan for review. Will close encounter

## 2018-12-07 ENCOUNTER — Ambulatory Visit
Admission: RE | Admit: 2018-12-07 | Discharge: 2018-12-07 | Disposition: A | Discharge status: Home / Self Care | Payer: 59 | Source: Ambulatory Visit | Attending: Internal Medicine | Admitting: Internal Medicine

## 2018-12-07 ENCOUNTER — Other Ambulatory Visit: Payer: Self-pay

## 2018-12-07 DIAGNOSIS — Z1231 Encounter for screening mammogram for malignant neoplasm of breast: Secondary | ICD-10-CM

## 2018-12-12 ENCOUNTER — Other Ambulatory Visit: Payer: 59

## 2019-01-11 ENCOUNTER — Other Ambulatory Visit: Payer: 59

## 2019-01-22 ENCOUNTER — Other Ambulatory Visit: Payer: Self-pay

## 2019-01-22 ENCOUNTER — Ambulatory Visit
Admission: RE | Admit: 2019-01-22 | Discharge: 2019-01-22 | Disposition: A | Discharge status: Home / Self Care | Payer: 59 | Source: Ambulatory Visit | Attending: Obstetrics and Gynecology | Admitting: Obstetrics and Gynecology

## 2019-01-22 DIAGNOSIS — Z1231 Encounter for screening mammogram for malignant neoplasm of breast: Secondary | ICD-10-CM

## 2019-01-22 MED ORDER — GADOBUTROL 1 MMOL/ML IV SOLN
6.0000 mL | Freq: Once | INTRAVENOUS | Status: AC | PRN
  Administered 2019-01-22: 08:00:00 6 mL via INTRAVENOUS

## 2019-02-21 ENCOUNTER — Telehealth: Payer: Self-pay | Admitting: Student

## 2019-02-21 NOTE — Telephone Encounter (Signed)
NIR.  Received call from patient requesting refill for Plavix.  [facility] in [place], [place] [phone]) at 1500 to fill prescription- Plavix 75 mg tablets, take 1/2 tablet by mouth once every other day, dispense 15 tablets with 3 refills.   Bea Graff Louk, PA-C 02/21/2019, 3:05 PM

## 2019-03-28 ENCOUNTER — Other Ambulatory Visit: Payer: Self-pay | Admitting: Obstetrics and Gynecology

## 2019-03-28 NOTE — Telephone Encounter (Signed)
Medication refill request: norethindrone  Last AEX:  04-13-2018 JJ  Next AEX: 04-18-19 Last MMG (if hormonal medication request): 12-07-2018 density D/BIRADS 1 negative  Refill authorized: Today, please advise.   Medication pended for #28, 0RF. Please refill if appropriate.

## 2019-04-12 ENCOUNTER — Other Ambulatory Visit: Payer: Self-pay | Admitting: Internal Medicine

## 2019-04-12 ENCOUNTER — Ambulatory Visit
Admission: RE | Admit: 2019-04-12 | Discharge: 2019-04-12 | Disposition: A | Discharge status: Home / Self Care | Payer: 59 | Source: Ambulatory Visit | Attending: Internal Medicine | Admitting: Internal Medicine

## 2019-04-12 DIAGNOSIS — M542 Cervicalgia: Secondary | ICD-10-CM

## 2019-04-16 NOTE — Progress Notes (Signed)
49 y.o. G1P1 Single Black or African American Not Hispanic or Latino female here for annual exam.  Patient states that she had a cervical spine X-ray 04/12/19 and it showed Osteoarthritic changes.  H/O aneurysm, s/p stent placement, on Plavix. She has a new 2 mm aneurysm.  She is on the POP, no menses. No vasomotor symptoms. She just started dating a long term friend.   She has been having neck pain. X Ray with osteoarthritic changes, mild spondylolisthesis, evidence of chronic muscle spasm. She is waiting to speak with her primary about a plan.   She has an elevated risk of breast cancer of 26.8%. FH of colon cancer, needs q 5 year colonoscopies     No LMP recorded. (Menstrual status: Oral contraceptives).          Sexually active: No.  The current method of family planning is POP (estrogen/progesterone).    Exercising: Yes.    Walking  Smoker:  no  Health Maintenance: Pap:  03-09-16 WNL NEG HR HPV 06-22-12 WNL NEG HR HPV History of abnormal Pap:  yes MMG:  12/07/18 Density D Bi-rads 1 neg 11 Breast MRI: 01/22/19, negative.  BMD:   never Colonoscopy: 2019 WNL per patient, done with [facility]. Due every 5 years.  TDaP:  11/24/14 Gardasil: NA   reports that she has never smoked. She has never used smokeless tobacco. She reports that she does not drink alcohol or use drugs. She has an 35year old son, raising him on her own. 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 She is one of 14 children, very close with her siblings. Parents have passed. 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 Abnormal pap   . Cerebral aneurysm without rupture   . Chest pain radiating to arm   . Family history of breast cancer   . Family history of colon cancer   . Family history of heart disease   . Gastroesophageal reflux   . Gestational diabetes   . Hyperlipidemia   . Hypertension   . Mitral valve regurgitation   . PONV  (postoperative nausea and vomiting)     Past Surgical History:  Procedure Laterality Date  . BREAST BIOPSY     age 68 left breast  . CARDIOVASCULAR STRESS TEST  05/19/11   05/19/11 Nuclear stress test ([facility]): Normal study, EF 87%  . CESAREAN SECTION  09/2005  . CT CORONARY CA SCORING  06/23/12   06/23/12 Cardiac CTA ([facility]): Ca score 0. Right dominant. Normal coronaries.  . INTRACRANIAL ANEURYSM REPAIR    . MYOMECTOMY  2005  . RADIOLOGY WITH ANESTHESIA N/A 11/21/2014   Procedure: Embolization;  Surgeon: Luanne Bras, MD;  Location: [facility];  Service: Radiology;  Laterality: N/A;  . US ECHOCARDIOGRAPHY  05/18/11   05/18/11 Echo ([place]): NL chamber size, LV systolic function, wall motion. LVEF 77%. Mild LVH. Mild MR/TR. NL PAP.    Current Outpatient Medications  Medication Sig Dispense Refill  . aspirin 81 MG tablet Take 81 mg by mouth daily.    . cetirizine (ZYRTEC) 10 MG tablet Take 10 mg by mouth daily.    . clopidogrel (PLAVIX) 75 MG tablet Take 0.5 tablets (37.5 mg total) by mouth every other day. 25 tablet 3  . enalapril (VASOTEC) 5 MG tablet Take 1 tablet (5 mg total) by mouth daily. 90 tablet 3  . nebivolol (BYSTOLIC) 5 MG  tablet Take 1 tablet (5 mg total) by mouth daily. 90 tablet 3  . norethindrone (MICRONOR) 0.35 MG tablet TAKE ONE TABLET BY MOUTH DAILY 28 tablet 0   No current facility-administered medications for this visit.    Family History  Problem Relation Age of Onset  . Hypertension Mother   . Liver disease Mother   . Colon cancer Father 6  . Throat cancer Father 43  . Heart disease Brother   . Aneurysm Brother   . Heart attack Brother   . Asthma Son   . Aneurysm Sister   . Aneurysm Brother   . Aneurysm Sister   . Heart attack Sister   . Aneurysm Sister 69  . Stroke Maternal Grandmother   . Breast cancer Paternal Aunt 41       metastatic  . Breast cancer Paternal Aunt 75       metastatic  . Colon polyps Brother      Review of Systems  All other systems reviewed and are negative.   Exam:   BP 100/62   Pulse 77   Temp 97.7 F (36.5 C)   Ht 5' 3.5" (1.613 m)   Wt 122 lb (55.3 kg)   SpO2 95%   BMI 21.27 kg/m   Weight change: @WEIGHTCHANGE @ Height:   Height: 5' 3.5" (161.3 cm)  Ht Readings from Last 3 Encounters:  04/18/19 5' 3.5" (1.613 m)  08/08/18 5\' 3"  (1.6 m)  04/13/18 5\' 3"  (1.6 m)    General appearance: alert, cooperative and appears stated age Head: Normocephalic, without obvious abnormality, atraumatic Neck: no adenopathy, supple, symmetrical, trachea midline and thyroid normal to inspection and palpation Lungs: clear to auscultation bilaterally Cardiovascular: regular rate and rhythm Breasts: normal appearance, no masses or tenderness Abdomen: soft, non-tender; non distended,  no masses,  no organomegaly Extremities: extremities normal, atraumatic, no cyanosis or edema Skin: Skin color, texture, turgor normal. No rashes or lesions Lymph nodes: Cervical, supraclavicular, and axillary nodes normal. No abnormal inguinal nodes palpated Neurologic: Grossly normal   Pelvic: External genitalia:  no lesions              Urethra:  normal appearing urethra with no masses, tenderness or lesions              Bartholins and Skenes: normal                 Vagina: normal appearing vagina with normal color and discharge, no lesions              Cervix: no lesions               Bimanual Exam:  Uterus:  irregularly shaped, slightly enlarged, mobile, nontender uterus. C/W fibroids              Adnexa: no mass, fullness, tenderness               Rectovaginal:deferred, external hemorrhoid noted  Gae Dry chaperoned for the exam.  A:  Well Woman with normal exam  On POP, will continue for the next year then consider checking FSH  Aneurysm, being followed, on plavix  H/O gestational DM  Hemorrhoid, acute  P:   No pap this year  Mammogram and MRI UTD  Discussed breast self  exam  Discussed calcium and vit D intake  HgbA1C, screening labs (fasting)  Anusol script sent. Information on hemorrhoids given

## 2019-04-17 ENCOUNTER — Other Ambulatory Visit: Payer: Self-pay

## 2019-04-18 ENCOUNTER — Ambulatory Visit (INDEPENDENT_AMBULATORY_CARE_PROVIDER_SITE_OTHER): Payer: 59 | Admitting: Obstetrics and Gynecology

## 2019-04-18 ENCOUNTER — Encounter: Payer: Self-pay | Admitting: Obstetrics and Gynecology

## 2019-04-18 VITALS — BP 100/62 | HR 77 | Temp 97.7°F | Ht 63.5 in | Wt 122.0 lb

## 2019-04-18 DIAGNOSIS — Z800 Family history of malignant neoplasm of digestive organs: Secondary | ICD-10-CM

## 2019-04-18 DIAGNOSIS — Z01419 Encounter for gynecological examination (general) (routine) without abnormal findings: Secondary | ICD-10-CM | POA: Diagnosis not present

## 2019-04-18 DIAGNOSIS — Z803 Family history of malignant neoplasm of breast: Secondary | ICD-10-CM

## 2019-04-18 DIAGNOSIS — Z8632 Personal history of gestational diabetes: Secondary | ICD-10-CM

## 2019-04-18 DIAGNOSIS — D259 Leiomyoma of uterus, unspecified: Secondary | ICD-10-CM

## 2019-04-18 DIAGNOSIS — Z9189 Other specified personal risk factors, not elsewhere classified: Secondary | ICD-10-CM

## 2019-04-18 DIAGNOSIS — K649 Unspecified hemorrhoids: Secondary | ICD-10-CM

## 2019-04-18 DIAGNOSIS — Z0000 Encounter for general adult medical examination without abnormal findings: Secondary | ICD-10-CM

## 2019-04-18 MED ORDER — HYDROCORTISONE (PERIANAL) 2.5 % EX CREA: TOPICAL_CREAM | Freq: Two times a day (BID) | CUTANEOUS | 1 refills | Status: DC

## 2019-04-18 NOTE — Patient Instructions (Addendum)
EXERCISE AND DIET:  We recommended that you start or continue a regular exercise program for good health. Regular exercise means any activity that makes your heart beat faster and makes you sweat.  We recommend exercising at least 30 minutes per day at least 3 days a week, preferably 4 or 5.  We also recommend a diet low in fat and sugar.  Inactivity, poor dietary choices and obesity can cause diabetes, heart attack, stroke, and kidney damage, among others.    ALCOHOL AND SMOKING:  Women should limit their alcohol intake to no more than 7 drinks/beers/glasses of wine (combined, not each!) per week. Moderation of alcohol intake to this level decreases your risk of breast cancer and liver damage. And of course, no recreational drugs are part of a healthy lifestyle.  And absolutely no smoking or even second hand smoke. Most people know smoking can cause heart and lung diseases, but did you know it also contributes to weakening of your bones? Aging of your skin?  Yellowing of your teeth and nails?  CALCIUM AND VITAMIN D:  Adequate intake of calcium and Vitamin D are recommended.  The recommendations for exact amounts of these supplements seem to change often, but generally speaking 1,000 mg of calcium (between diet and supplement) and 800 units of Vitamin D per day seems prudent. Certain women may benefit from higher intake of Vitamin D.  If you are among these women, your doctor will have told you during your visit.    PAP SMEARS:  Pap smears, to check for cervical cancer or precancers,  have traditionally been done yearly, although recent scientific advances have shown that most women can have pap smears less often.  However, every woman still should have a physical exam from her gynecologist every year. It will include a breast check, inspection of the vulva and vagina to check for abnormal growths or skin changes, a visual exam of the cervix, and then an exam to evaluate the size and shape of the uterus and  ovaries.  And after 51 years of age, a rectal exam is indicated to check for rectal cancers. We will also provide age appropriate advice regarding health maintenance, like when you should have certain vaccines, screening for sexually transmitted diseases, bone density testing, colonoscopy, mammograms, etc.   MAMMOGRAMS:  All women over 40 years old should have a yearly mammogram. Many facilities now offer a "3D" mammogram, which may cost around $50 extra out of pocket. If possible,  we recommend you accept the option to have the 3D mammogram performed.  It both reduces the number of women who will be called back for extra views which then turn out to be normal, and it is better than the routine mammogram at detecting truly abnormal areas.    COLON CANCER SCREENING: Now recommend starting at age 45. At this time colonoscopy is not covered for routine screening until 50. There are take home tests that can be done between 45-49.   COLONOSCOPY:  Colonoscopy to screen for colon cancer is recommended for all women at age 50.  We know, you hate the idea of the prep.  We agree, BUT, having colon cancer and not knowing it is worse!!  Colon cancer so often starts as a polyp that can be seen and removed at colonscopy, which can quite literally save your life!  And if your first colonoscopy is normal and you have no family history of colon cancer, most women don't have to have it again for   10 years.  Once every ten years, you can do something that may end up saving your life, right?  We will be happy to help you get it scheduled when you are ready.  Be sure to check your insurance coverage so you understand how much it will cost.  It may be covered as a preventative service at no cost, but you should check your particular policy.      Breast Self-Awareness Breast self-awareness means being familiar with how your breasts look and feel. It involves checking your breasts regularly and reporting any changes to your  health care provider. Practicing breast self-awareness is important. A change in your breasts can be a sign of a serious medical problem. Being familiar with how your breasts look and feel allows you to find any problems early, when treatment is more likely to be successful. All women should practice breast self-awareness, including women who have had breast implants. How to do a breast self-exam One way to learn what is normal for your breasts and whether your breasts are changing is to do a breast self-exam. To do a breast self-exam: Look for Changes  1. Remove all the clothing above your waist. 2. Stand in front of a mirror in a room with good lighting. 3. Put your hands on your hips. 4. Push your hands firmly downward. 5. Compare your breasts in the mirror. Look for differences between them (asymmetry), such as: ? Differences in shape. ? Differences in size. ? Puckers, dips, and bumps in one breast and not the other. 6. Look at each breast for changes in your skin, such as: ? Redness. ? Scaly areas. 7. Look for changes in your nipples, such as: ? Discharge. ? Bleeding. ? Dimpling. ? Redness. ? A change in position. Feel for Changes Carefully feel your breasts for lumps and changes. It is best to do this while lying on your back on the floor and again while sitting or standing in the shower or tub with soapy water on your skin. Feel each breast in the following way:  Place the arm on the side of the breast you are examining above your head.  Feel your breast with the other hand.  Start in the nipple area and make  inch (2 cm) overlapping circles to feel your breast. Use the pads of your three middle fingers to do this. Apply light pressure, then medium pressure, then firm pressure. The light pressure will allow you to feel the tissue closest to the skin. The medium pressure will allow you to feel the tissue that is a little deeper. The firm pressure will allow you to feel the tissue  close to the ribs.  Continue the overlapping circles, moving downward over the breast until you feel your ribs below your breast.  Move one finger-width toward the center of the body. Continue to use the  inch (2 cm) overlapping circles to feel your breast as you move slowly up toward your collarbone.  Continue the up and down exam using all three pressures until you reach your armpit.  Write Down What You Find  Write down what is normal for each breast and any changes that you find. Keep a written record with breast changes or normal findings for each breast. By writing this information down, you do not need to depend only on memory for size, tenderness, or location. Write down where you are in your menstrual cycle, if you are still menstruating. If you are having trouble noticing differences   in your breasts, do not get discouraged. With time you will become more familiar with the variations in your breasts and more comfortable with the exam. How often should I examine my breasts? Examine your breasts every month. If you are breastfeeding, the best time to examine your breasts is after a feeding or after using a breast pump. If you menstruate, the best time to examine your breasts is 5-7 days after your period is over. During your period, your breasts are lumpier, and it may be more difficult to notice changes. When should I see my health care provider? See your health care provider if you notice:  A change in shape or size of your breasts or nipples.  A change in the skin of your breast or nipples, such as a reddened or scaly area.  Unusual discharge from your nipples.  A lump or thick area that was not there before.  Pain in your breasts.  Anything that concerns you.  Hemorrhoids Hemorrhoids are swollen veins in and around the rectum or anus. There are two types of hemorrhoids:  Internal hemorrhoids. These occur in the veins that are just inside the rectum. They may poke through  to the outside and become irritated and painful.  External hemorrhoids. These occur in the veins that are outside the anus and can be felt as a painful swelling or hard lump near the anus. Most hemorrhoids do not cause serious problems, and they can be managed with home treatments such as diet and lifestyle changes. If home treatments do not help the symptoms, procedures can be done to shrink or remove the hemorrhoids. What are the causes? This condition is caused by increased pressure in the anal area. This pressure may result from various things, including:  Constipation.  Straining to have a bowel movement.  Diarrhea.  Pregnancy.  Obesity.  Sitting for long periods of time.  Heavy lifting or other activity that causes you to strain.  Anal sex.  Riding a bike for a long period of time. What are the signs or symptoms? Symptoms of this condition include:  Pain.  Anal itching or irritation.  Rectal bleeding.  Leakage of stool (feces).  Anal swelling.  One or more lumps around the anus. How is this diagnosed? This condition can often be diagnosed through a visual exam. Other exams or tests may also be done, such as:  An exam that involves feeling the rectal area with a gloved hand (digital rectal exam).  An exam of the anal canal that is done using a small tube (anoscope).  A blood test, if you have lost a significant amount of blood.  A test to look inside the colon using a flexible tube with a camera on the end (sigmoidoscopy or colonoscopy). How is this treated? This condition can usually be treated at home. However, various procedures may be done if dietary changes, lifestyle changes, and other home treatments do not help your symptoms. These procedures can help make the hemorrhoids smaller or remove them completely. Some of these procedures involve surgery, and others do not. Common procedures include:  Rubber band ligation. Rubber bands are placed at the base of  the hemorrhoids to cut off their blood supply.  Sclerotherapy. Medicine is injected into the hemorrhoids to shrink them.  Infrared coagulation. A type of light energy is used to get rid of the hemorrhoids.  Hemorrhoidectomy surgery. The hemorrhoids are surgically removed, and the veins that supply them are tied off.  Stapled hemorrhoidopexy surgery. The  surgeon staples the base of the hemorrhoid to the rectal wall. Follow these instructions at home: Eating and drinking   Eat foods that have a lot of fiber in them, such as whole grains, beans, nuts, fruits, and vegetables.  Ask your health care provider about taking products that have added fiber (fiber supplements).  Reduce the amount of fat in your diet. You can do this by eating low-fat dairy products, eating less red meat, and avoiding processed foods.  Drink enough fluid to keep your urine pale yellow. Managing pain and swelling   Take warm sitz baths for 20 minutes, 3-4 times a day to ease pain and discomfort. You may do this in a bathtub or using a portable sitz bath that fits over the toilet.  If directed, apply ice to the affected area. Using ice packs between sitz baths may be helpful. ? Put ice in a plastic bag. ? Place a towel between your skin and the bag. ? Leave the ice on for 20 minutes, 2-3 times a day. General instructions  Take over-the-counter and prescription medicines only as told by your health care provider.  Use medicated creams or suppositories as told.  Get regular exercise. Ask your health care provider how much and what kind of exercise is best for you. In general, you should do moderate exercise for at least 30 minutes on most days of the week (150 minutes each week). This can include activities such as walking, biking, or yoga.  Go to the bathroom when you have the urge to have a bowel movement. Do not wait.  Avoid straining to have bowel movements.  Keep the anal area dry and clean. Use wet  toilet paper or moist towelettes after a bowel movement.  Do not sit on the toilet for long periods of time. This increases blood pooling and pain.  Keep all follow-up visits as told by your health care provider. This is important. Contact a health care provider if you have:  Increasing pain and swelling that are not controlled by treatment or medicine.  Difficulty having a bowel movement, or you are unable to have a bowel movement.  Pain or inflammation outside the area of the hemorrhoids. Get help right away if you have:  Uncontrolled bleeding from your rectum. Summary  Hemorrhoids are swollen veins in and around the rectum or anus.  Most hemorrhoids can be managed with home treatments such as diet and lifestyle changes.  Taking warm sitz baths can help ease pain and discomfort.  In severe cases, procedures or surgery can be done to shrink or remove the hemorrhoids. This information is not intended to replace advice given to you by your health care provider. Make sure you discuss any questions you have with your health care provider. Document Revised: 08/04/2018 Document Reviewed: 07/28/2017 Elsevier Patient Education  [place].

## 2019-04-19 LAB — COMPREHENSIVE METABOLIC PANEL
ALT: 7 IU/L (ref 0–32)
AST: 18 IU/L (ref 0–40)
Albumin/Globulin Ratio: 2 (ref 1.2–2.2)
Albumin: 4.4 g/dL (ref 3.8–4.8)
Alkaline Phosphatase: 78 IU/L (ref 39–117)
BUN/Creatinine Ratio: 10 (ref 9–23)
BUN: 9 mg/dL (ref 6–24)
Bilirubin Total: 0.2 mg/dL (ref 0.0–1.2)
CO2: 25 mmol/L (ref 20–29)
Calcium: 9.6 mg/dL (ref 8.7–10.2)
Chloride: 105 mmol/L (ref 96–106)
Creatinine, Ser: 0.94 mg/dL (ref 0.57–1.00)
GFR calc Af Amer: 82 mL/min/{1.73_m2} (ref >59)
GFR calc non Af Amer: 71 mL/min/{1.73_m2} (ref >59)
Globulin, Total: 2.2 g/dL (ref 1.5–4.5)
Glucose: 80 mg/dL (ref 65–99)
Potassium: 4.3 mmol/L (ref 3.5–5.2)
Sodium: 142 mmol/L (ref 134–144)
Total Protein: 6.6 g/dL (ref 6.0–8.5)

## 2019-04-19 LAB — LIPID PANEL
Chol/HDL Ratio: 2.9 ratio (ref 0.0–4.4)
Cholesterol, Total: 204 mg/dL — ABNORMAL HIGH (ref 100–199)
HDL: 70 mg/dL (ref >39)
LDL Chol Calc (NIH): 125 mg/dL — ABNORMAL HIGH (ref 0–99)
Triglycerides: 49 mg/dL (ref 0–149)
VLDL Cholesterol Cal: 9 mg/dL (ref 5–40)

## 2019-04-19 LAB — CBC
Hematocrit: 41.5 % (ref 34.0–46.6)
Hemoglobin: 13.5 g/dL (ref 11.1–15.9)
MCH: 29.4 pg (ref 26.6–33.0)
MCHC: 32.5 g/dL (ref 31.5–35.7)
MCV: 90 fL (ref 79–97)
Platelets: 224 10*3/uL (ref 150–450)
RBC: 4.59 x10E6/uL (ref 3.77–5.28)
RDW: 13.3 % (ref 11.7–15.4)
WBC: 3.9 10*3/uL (ref 3.4–10.8)

## 2019-04-19 LAB — HEMOGLOBIN A1C
Est. average glucose Bld gHb Est-mCnc: 108 mg/dL
Hgb A1c MFr Bld: 5.4 % (ref 4.8–5.6)

## 2019-04-23 ENCOUNTER — Telehealth: Payer: Self-pay | Admitting: Cardiovascular Disease

## 2019-04-23 NOTE — Telephone Encounter (Signed)
Patient states [employer] is requesting prior authorization from Dr. Gwenlyn Found in order to refill nebivolol (BYSTOLIC) 5 MG tablet medication prescriptions. Please call.

## 2019-04-23 NOTE — Telephone Encounter (Signed)
Medication needing PA. Will route to primary nurse.

## 2019-04-24 NOTE — Telephone Encounter (Signed)
All good questions.  Lets have you set up to see one of our APP's in clinic virtually to discuss your options and medication management.  Thank you.  Candee Furbish, MD

## 2019-04-25 ENCOUNTER — Other Ambulatory Visit: Payer: Self-pay | Admitting: Obstetrics and Gynecology

## 2019-04-25 ENCOUNTER — Encounter: Payer: Self-pay | Admitting: Physician Assistant

## 2019-04-25 ENCOUNTER — Telehealth (INDEPENDENT_AMBULATORY_CARE_PROVIDER_SITE_OTHER): Payer: 59 | Admitting: Physician Assistant

## 2019-04-25 ENCOUNTER — Other Ambulatory Visit: Payer: Self-pay

## 2019-04-25 VITALS — BP 117/84 | HR 78 | Ht 63.5 in | Wt 122.0 lb

## 2019-04-25 DIAGNOSIS — Z01419 Encounter for gynecological examination (general) (routine) without abnormal findings: Secondary | ICD-10-CM

## 2019-04-25 DIAGNOSIS — I10 Essential (primary) hypertension: Secondary | ICD-10-CM | POA: Diagnosis not present

## 2019-04-25 DIAGNOSIS — R000 Tachycardia, unspecified: Secondary | ICD-10-CM

## 2019-04-25 DIAGNOSIS — E785 Hyperlipidemia, unspecified: Secondary | ICD-10-CM

## 2019-04-25 MED ORDER — ATENOLOL 50 MG PO TABS: 50.0000 mg | ORAL_TABLET | Freq: Every day | ORAL | 3 refills | Status: DC

## 2019-04-25 NOTE — Progress Notes (Addendum)
Virtual Visit via Video Note   This visit type was conducted due to national recommendations for restrictions regarding the COVID-19 Pandemic (e.g. social distancing) in an effort to limit this patient's exposure and mitigate transmission in our community.  Due to her co-morbid illnesses, this patient is at least at moderate risk for complications without adequate follow up.  This format is felt to be most appropriate for this patient at this time.  All issues noted in this document were discussed and addressed.  A limited physical exam was performed with this format.  Please refer to the patient's chart for her consent to telehealth for [facility]. Virtual platform was offered given ongoing worsening Covid-19 pandemic.  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 Estanislado Pandy), GERD, HTN, HLD, gestational DM who presents for medication discussion virtually. She was remotely seen by Dr. Gwenlyn Found for chest pain and had a normal stress echo in 2010 and normal ETT in 2017. She also reports a history of elevated basal HR after cerebral aneurysm surgery (I.e. 102bpm - no specific arrhythmia diagnosed at that time). She takes Bystolic for this. She saw Dr. Marlou Porch in 07/2018 for add-on visit chest pain felt MSK in etiology. She also has a family history of cerebral aneurysms in her siblings and also one brother who suffered a massive heart attack.  She is seen virtually to discuss switching beta blockers. Her insurance company recently started requiring  prior auth for [employer]. In the interim she has also seen OB GYN who obtained updated bloodwork which showed mild hyperlipidemia, higher than 2017 (LDL 79->125).  Last labs otherwise personally reviewed from 03/2019 showed normal CBC, A1C, CMET (K 4.3, Cr 0.94). She adheres to a healthy diet and remains active walking regularly without any recent concerning cardiac symptoms.   Past Medical History:  Diagnosis Date  . Abnormal pap   . Cerebral aneurysm without rupture   . Chest pain radiating to arm   . Family history of breast cancer   . Family history of colon cancer   . Family history of heart disease   . Gastroesophageal reflux   . Gestational diabetes   . Hyperlipidemia   . Hypertension   . Mitral valve regurgitation   . PONV (postoperative nausea and vomiting)    Past Surgical History:  Procedure Laterality Date  . BREAST BIOPSY     age 24 left breast  . CARDIOVASCULAR STRESS TEST  05/19/11   05/19/11 Nuclear stress test ([facility]): Normal study, EF 87%  . CESAREAN SECTION  09/2005  . CT CORONARY CA SCORING  06/23/12   06/23/12 Cardiac CTA ([facility]): Ca score 0. Right dominant. Normal coronaries.  . INTRACRANIAL ANEURYSM REPAIR    . MYOMECTOMY  2005  . RADIOLOGY WITH ANESTHESIA N/A 11/21/2014   Procedure: Embolization;  Surgeon: Luanne Bras, MD;  Location: [place];  Service: Radiology;  Laterality: N/A;  . US ECHOCARDIOGRAPHY  05/18/11   05/18/11 Echo ([place]): NL chamber size, LV systolic function, wall motion. LVEF 77%. Mild LVH. Mild MR/TR. NL  PAP.     Current Meds  Medication Sig  . aspirin 81 MG tablet Take 81 mg by mouth daily.  . cetirizine (ZYRTEC) 10 MG tablet Take 10 mg by mouth daily.  . clopidogrel (PLAVIX) 75 MG tablet Take 0.5 tablets (37.5 mg total) by mouth every other day.  . enalapril (VASOTEC) 5 MG tablet Take 1 tablet (5 mg total) by mouth daily.  . hydrocortisone (ANUSOL-HC) 2.5 % rectal cream Place rectally 2 (two) times daily.    . nebivolol (BYSTOLIC) 5 MG tablet Take 1 tablet (5 mg total) by mouth daily.  . norethindrone (MICRONOR) 0.35 MG tablet TAKE ONE TABLET BY MOUTH DAILY     Allergies:   Patient has no known allergies.   Social History   Tobacco Use  . Smoking status: Never Smoker  . Smokeless tobacco: Never Used  Substance Use Topics  . Alcohol use: No    Alcohol/week: 0.0 standard drinks  . Drug use: No     Family Hx: The patient's family history includes Aneurysm in her brother, brother, sister, and sister; Aneurysm (age of onset: 70) in her sister; Asthma in her son; Breast cancer (age of onset: 36) in her paternal aunt and paternal aunt; Colon cancer (age of onset: 32) in her father; Colon polyps in her brother; Heart attack in her brother and sister; Heart disease in her brother; Hypertension in her mother; Liver disease in her mother; Stroke in her maternal grandmother; Throat cancer (age of onset: 79) in her father.  ROS:   Please see the history of present illness.    All other systems reviewed and are negative.   Prior CV studies:    Most recent pertinent cardiac studies are outlined and summarized above. Reports included below if pertinent.  ETT 2017  There was no ST segment deviation noted during stress.  9 minutes and 15 seconds exercise time-good  Low risk exercise treadmill test with no electrocardiographic evidence of ischemia   Candee Furbish, MD    Labs/Other Tests and Data Reviewed:    EKG:  An ECG dated 08/08/18 was personally reviewed today and demonstrated:  NSR 72bpm essentially normal, no acute changes  Recent Labs: 04/18/2019: ALT 7; BUN 9; Creatinine, Ser 0.94; Hemoglobin 13.5; Platelets 224; Potassium 4.3; Sodium 142   Recent Lipid Panel Lab Results  Component Value Date/Time   CHOL 204 (H) 04/18/2019 09:38 AM   TRIG 49 04/18/2019 09:38 AM   HDL 70 04/18/2019 09:38 AM   CHOLHDL 2.9 04/18/2019 09:38 AM   CHOLHDL 2.5 12/16/2015 08:51 AM   LDLCALC 125 (H)  04/18/2019 09:38 AM    Wt Readings from Last 3 Encounters:  04/25/19 122 lb (55.3 kg)  04/18/19 122 lb (55.3 kg)  08/08/18 123 lb 6.4 oz (56 kg)     Objective:    Vital Signs:  BP 117/84   Pulse 78   Ht 5' 3.5" (1.613 m)   Wt 122 lb (55.3 kg)   BMI 21.27 kg/m    VS reviewed. General - adult F in no acute distress HEENT - NCAT, EOM intact Pulm - No labored breathing, no coughing during visit, no audible wheezing, speaking in full sentences Neuro - A+Ox3, no slurred speech, answers questions appropriately Psych - Pleasant affect     ASSESSMENT & PLAN:    1. Essential hypertension - she is interesting in switching from Bystolic to atenolol which should not require further issues with insurance. Per literature review, the equivalent dose of atenolol is  50mg  so will trial this dose. She prefers 90-day supply but will likely plan to pick up 30 days worth initially to see how she tolerates this. I asked her to check her BP and HR daily after starting and report back with values via MyChart on 04/30/19.  2. Mild hyperlipidemia - Bystolic does carry the AE of hypercholesterolemia so may be related. She also reports her activity might be a little less with the pandemic although still generally following a healthy lifestyle and walking regularly. We will change to atenolol and f/u with a fasting lipid panel in 8 weeks.  3. History of elevated HR - no specific arrhythmia diagnosis at time of occurrence, doing well on beta blocker therapy.  I suspect this was likely sinus tachycardia mediated by the adrenergic post-op state. Follow with adjustment in regimen. If there is any recurrence in the future, would suggest event monitoring.  Time:   Today, I have spent 10 minutes with the patient with telehealth technology discussing the above problems. Additional 6 minutes spent reviewing patient's history in preparation for visit and post-charting.   Medication Adjustments/Labs and Tests  Ordered: Current medicines are reviewed at length with the patient today.  Concerns regarding medicines are outlined above.   Follow Up:  In Person 1 year with Dr. Gwenlyn Found  Signed, Charlie Pitter, PA-C  04/25/2019 8:38 AM    [place]

## 2019-04-25 NOTE — Patient Instructions (Signed)
Medication Instructions:  Your physician has recommended you make the following change in your medication:  1.  STOP Bystolic 2.  START Atenolol 50 mg taking 1 tablet daily   *If you need a refill on your cardiac medications before your next appointment, please call your pharmacy*  Lab Work: 06/22/2019 COME TO DR. Kennon Holter OFFICE FOR FASTING CHOLESTEROL BLOOD WORK.  NOTHING TO EAT OR DRINK AFTER MIDNIGHT THE NIGHT BEFORE  If you have labs (blood work) drawn today and your tests are completely normal, you will receive your results only by: Marland Kitchen MyChart Message (if you have MyChart) OR . A paper copy in the mail If you have any lab test that is abnormal or we need to change your treatment, we will call you to review the results.  Testing/Procedures: None ordered  Follow-Up: At [facility], you and your health needs are our priority.  As part of our continuing mission to provide you with exceptional heart care, we have created designated Provider Care Teams.  These Care Teams include your primary Cardiologist (physician) and Advanced Practice Providers (APPs -  Physician Assistants and Nurse Practitioners) who all work together to provide you with the care you need, when you need it.  Your next appointment:   12 month(s)  The format for your next appointment:   In Person  Provider:   You may see Quay Burow, MD or one of the following Advanced Practice Providers on your designated Care Team:    Kerin Ransom, PA-C  [place], [place]  Coletta Memos, [place]   Other Instructions  Please keep a log of daily blood pressures and heart rate and send in readings via MyChart on 04/30/19.

## 2019-04-25 NOTE — Telephone Encounter (Signed)
Med refill request: [facility] Last AEX: 04/18/2019, reviewed OV and needs refill until next AEX and FSH at that time.  Next AEX: 04/24/2020 Last MMG (if hormonal med) 01/22/2019 breast MRI, BiRads 1, negative Refill authorized: # 3 packages, 4 RF, pended if approved.

## 2019-06-24 ENCOUNTER — Encounter (HOSPITAL_COMMUNITY): Payer: Self-pay

## 2019-06-24 ENCOUNTER — Ambulatory Visit (HOSPITAL_COMMUNITY)
Admission: EM | Admit: 2019-06-24 | Discharge: 2019-06-24 | Disposition: A | Discharge status: Home / Self Care | Payer: 59 | Attending: Emergency Medicine | Admitting: Emergency Medicine

## 2019-06-24 ENCOUNTER — Other Ambulatory Visit: Payer: Self-pay

## 2019-06-24 DIAGNOSIS — Z20822 Contact with and (suspected) exposure to covid-19: Secondary | ICD-10-CM | POA: Diagnosis not present

## 2019-06-24 DIAGNOSIS — Z7902 Long term (current) use of antithrombotics/antiplatelets: Secondary | ICD-10-CM | POA: Insufficient documentation

## 2019-06-24 DIAGNOSIS — J069 Acute upper respiratory infection, unspecified: Secondary | ICD-10-CM | POA: Diagnosis not present

## 2019-06-24 DIAGNOSIS — R0602 Shortness of breath: Secondary | ICD-10-CM | POA: Diagnosis not present

## 2019-06-24 DIAGNOSIS — I10 Essential (primary) hypertension: Secondary | ICD-10-CM | POA: Insufficient documentation

## 2019-06-24 DIAGNOSIS — R05 Cough: Secondary | ICD-10-CM | POA: Diagnosis not present

## 2019-06-24 DIAGNOSIS — E785 Hyperlipidemia, unspecified: Secondary | ICD-10-CM | POA: Diagnosis not present

## 2019-06-24 DIAGNOSIS — Z7982 Long term (current) use of aspirin: Secondary | ICD-10-CM | POA: Insufficient documentation

## 2019-06-24 DIAGNOSIS — Z79899 Other long term (current) drug therapy: Secondary | ICD-10-CM | POA: Diagnosis not present

## 2019-06-24 LAB — SARS CORONAVIRUS 2 (TAT 6-24 HRS): SARS Coronavirus 2: NEGATIVE

## 2019-06-24 MED ORDER — ALBUTEROL SULFATE HFA 108 (90 BASE) MCG/ACT IN AERS: 1.0000 | INHALATION_SPRAY | Freq: Four times a day (QID) | RESPIRATORY_TRACT | 0 refills | Status: DC | PRN

## 2019-06-24 MED ORDER — CETIRIZINE HCL 10 MG PO CAPS: 10.0000 mg | ORAL_CAPSULE | Freq: Every day | ORAL | 0 refills | Status: AC

## 2019-06-24 MED ORDER — DOXYCYCLINE HYCLATE 100 MG PO CAPS: 100.0000 mg | ORAL_CAPSULE | Freq: Two times a day (BID) | ORAL | 0 refills | Status: AC

## 2019-06-24 MED ORDER — BENZONATATE 200 MG PO CAPS: 200.0000 mg | ORAL_CAPSULE | Freq: Three times a day (TID) | ORAL | 0 refills | Status: AC | PRN

## 2019-06-24 MED ORDER — FLUTICASONE PROPIONATE 50 MCG/ACT NA SUSP: 1.0000 | Freq: Every day | NASAL | 0 refills | Status: DC

## 2019-06-24 NOTE — Discharge Instructions (Signed)
Covid swab pending, monitor my chart for results I recommend to continue symptomatic management of your symptoms with continued monitoring for resolution over the next 3 to 4 days.  Most likely viral upper respiratory infection or allergies. Continue Tylenol and ibuprofen as needed for any fevers, body aches, headaches Flonase nasal spray and cetirizine to help with congestion, sinus inflammation and any postnasal drainage Tessalon/benzonatate every 8 hours for cough Rest and drink plenty of fluids If you do not see any improvement over the next 3 to 4 days you may fill prescription for doxycycline later this week  Please continue to monitor your breathing and temperature, follow-up if symptoms not improving or worsening, developing difficulty breathing or shortness of breath

## 2019-06-24 NOTE — ED Triage Notes (Signed)
Pt present Productive cough with nasal congestion. Symptoms started on Tuesday after patient received her second vaccine for covid 19. Pt has tried otc medication with no relief.

## 2019-06-24 NOTE — ED Provider Notes (Signed)
Sharon Mendoza    CSN: [id number] Arrival date & time: 06/24/19  1007      History   Chief Complaint Chief Complaint  Patient presents with  . Cough  . Nasal Congestion    HPI Sharon Mendoza is a 49 y.o. female history of hypertension, hyperlipidemia, presenting today for evaluation of URI symptoms and cough.  Patient received her second Covid vaccine on 3/30, [employer].  Later that evening she began to develop body aches headaches and fevers.  She has also had some cough and congestion.  Cough initially dry, but more productive now.  Occasional shortness of breath, used son's albuterol nebulizer with relief of shortness of breath.  She denies any history of asthma or tobacco use.  Fevers up to 100.9.  Using Tylenol, Delsym and Benadryl without relief.  HPI  Past Medical History:  Diagnosis Date  . Abnormal pap   . Cerebral aneurysm without rupture   . Chest pain radiating to arm   . Family history of breast cancer   . Family history of colon cancer   . Family history of heart disease   . Gastroesophageal reflux   . Gestational diabetes   . Hyperlipidemia   . Hypertension   . Mitral valve regurgitation   . PONV (postoperative nausea and vomiting)     Patient Active Problem List   Diagnosis Date Noted  . History of cerebral aneurysm 04/17/2018  . Genetic testing 06/08/2017  . Family history of colon cancer   . Family history of breast cancer   . Medication management 05/13/2015  . Essential hypertension 02/18/2015  . Hyperlipidemia 02/18/2015  . Chest pain 02/18/2015  . Brain aneurysm 11/21/2014  . Pain in joint, shoulder region 12/10/2013  . LBP radiating to left leg 12/10/2013  . Aneurysm, cerebral, nonruptured 12/10/2013  . Fibroid uterus 06/22/2012  . Menorrhagia 06/22/2012  . Dysmenorrhea 06/22/2012    Past Surgical History:  Procedure Laterality Date  . BREAST BIOPSY     age 63 left breast  . CARDIOVASCULAR STRESS TEST  05/19/11   05/19/11 Nuclear  stress test ([facility]): Normal study, EF 87%  . CESAREAN SECTION  09/2005  . CT CORONARY CA SCORING  06/23/12   06/23/12 Cardiac CTA ([facility]): Ca score 0. Right dominant. Normal coronaries.  . INTRACRANIAL ANEURYSM REPAIR    . MYOMECTOMY  2005  . RADIOLOGY WITH ANESTHESIA N/A 11/21/2014   Procedure: Embolization;  Surgeon: Luanne Bras, MD;  Location: [place];  Service: Radiology;  Laterality: N/A;  . US ECHOCARDIOGRAPHY  05/18/11   05/18/11 Echo ([place]): NL chamber size, LV systolic function, wall motion. LVEF 77%. Mild LVH. Mild MR/TR. NL PAP.    OB History    Gravida  1   Para  1   Term      Preterm      AB      Living  1     SAB      TAB      Ectopic      Multiple      Live Births               Home Medications    Prior to Admission medications   Medication Sig Start Date End Date Taking? Authorizing Provider  albuterol (VENTOLIN HFA) 108 (90 Base) MCG/ACT inhaler Inhale 1-2 puffs into the lungs every 6 (six) hours as needed for wheezing or shortness of breath. 06/24/19   Darleth Eustache C, PA-C  aspirin 81  MG tablet Take 81 mg by mouth daily.    [provider]  atenolol (TENORMIN) 50 MG tablet Take 1 tablet (50 mg total) by mouth daily. 04/25/19 07/24/19  Dunn, Nedra Hai, PA-C  benzonatate (TESSALON) 200 MG capsule Take 1 capsule (200 mg total) by mouth 3 (three) times daily as needed for up to 7 days for cough. 06/24/19 07/01/19  Marshun Duva C, PA-C  Cetirizine HCl 10 MG CAPS Take 1 capsule (10 mg total) by mouth daily. 06/24/19   Silvino Selman C, PA-C  clopidogrel (PLAVIX) 75 MG tablet Take 0.5 tablets (37.5 mg total) by mouth every other day. 06/14/17   Lorretta Harp, MD  doxycycline (VIBRAMYCIN) 100 MG capsule Take 1 capsule (100 mg total) by mouth 2 (two) times daily for 7 days. 06/24/19 07/01/19  Aithana Kushner C, PA-C  enalapril (VASOTEC) 5 MG tablet Take 1 tablet (5 mg total) by mouth daily. 09/18/18   Lorretta Harp, MD   fluticasone (FLONASE) 50 MCG/ACT nasal spray Place 1-2 sprays into both nostrils daily for 7 days. 06/24/19 07/01/19  Rebie Peale C, PA-C  hydrocortisone (ANUSOL-HC) 2.5 % rectal cream Place rectally 2 (two) times daily. 04/18/19   Salvadore Dom, MD  norethindrone (MICRONOR) 0.35 MG tablet TAKE ONE TABLET BY MOUTH DAILY 04/25/19   Salvadore Dom, MD    Family History Family History  Problem Relation Age of Onset  . Hypertension Mother   . Liver disease Mother   . Colon cancer Father 63  . Throat cancer Father 48  . Heart disease Brother   . Aneurysm Brother   . Heart attack Brother   . Asthma Son   . Aneurysm Sister   . Aneurysm Brother   . Aneurysm Sister   . Heart attack Sister   . Aneurysm Sister 43  . Stroke Maternal Grandmother   . Breast cancer Paternal Aunt 86       metastatic  . Breast cancer Paternal Aunt 71       metastatic  . Colon polyps Brother     Social History Social History   Tobacco Use  . Smoking status: Never Smoker  . Smokeless tobacco: Never Used  Substance Use Topics  . Alcohol use: No    Alcohol/week: 0.0 standard drinks  . Drug use: No     Allergies   Patient has no known allergies.   Review of Systems Review of Systems  Constitutional: Positive for fatigue and fever. Negative for activity change, appetite change and chills.  HENT: Positive for congestion, rhinorrhea and sinus pressure. Negative for ear pain, sore throat and trouble swallowing.   Eyes: Negative for discharge and redness.  Respiratory: Positive for cough and shortness of breath. Negative for chest tightness.   Cardiovascular: Negative for chest pain.  Gastrointestinal: Negative for abdominal pain, diarrhea, nausea and vomiting.  Musculoskeletal: Negative for myalgias.  Skin: Negative for rash.  Neurological: Positive for headaches. Negative for dizziness and light-headedness.     Physical Exam Triage Vital Signs ED Triage Vitals  Enc Vitals Group      BP 06/24/19 1034 134/80     Pulse Rate 06/24/19 1034 66     Resp 06/24/19 1034 16     Temp 06/24/19 1034 99 F (37.2 C)     Temp Source 06/24/19 1034 Oral     SpO2 06/24/19 1034 100 %     Weight --      Height --      Head Circumference --  Peak Flow --      Pain Score 06/24/19 1032 0     Pain Loc --      Pain Edu? --      Excl. in [place]? --    No data found.  Updated Vital Signs BP 134/80 (BP Location: Right Arm)   Pulse 66   Temp 99 F (37.2 C) (Oral)   Resp 16   SpO2 100%   Visual Acuity Right Eye Distance:   Left Eye Distance:   Bilateral Distance:    Right Eye Near:   Left Eye Near:    Bilateral Near:     Physical Exam Vitals and nursing note reviewed.  Constitutional:      Appearance: She is well-developed.     Comments: No acute distress  HENT:     Head: Normocephalic and atraumatic.     Ears:     Comments: Bilateral ears without tenderness to palpation of external auricle, tragus and mastoid, EAC's without erythema or swelling, TM's with good bony landmarks and cone of light. Non erythematous.     Nose: Nose normal.     Comments: Nasal mucosa erythematous, mildly swollen turbinates bilaterally    Mouth/Throat:     Comments: Oral mucosa pink and moist, no tonsillar enlargement or exudate. Posterior pharynx patent and nonerythematous, no uvula deviation or swelling. Normal phonation. Eyes:     Conjunctiva/sclera: Conjunctivae normal.  Cardiovascular:     Rate and Rhythm: Normal rate.  Pulmonary:     Effort: Pulmonary effort is normal. No respiratory distress.     Comments: Breathing comfortably at rest, CTABL, no wheezing, rales or other adventitious sounds auscultated  Abdominal:     General: There is no distension.  Musculoskeletal:        General: Normal range of motion.     Cervical back: Neck supple.  Skin:    General: Skin is warm and dry.  Neurological:     Mental Status: She is alert and oriented to person, place, and time.      UC  Treatments / Results  Labs (all labs ordered are listed, but only abnormal results are displayed) Labs Reviewed  SARS CORONAVIRUS 2 (TAT 6-24 HRS)    EKG   Radiology No results found.  Procedures Procedures (including critical care time)  Medications Ordered in UC Medications - No data to display  Initial Impression / Assessment and Plan / UC Course  I have reviewed the triage vital signs and the nursing notes.  Pertinent labs & imaging results that were available during my care of the patient were reviewed by me and considered in my medical decision making (see chart for details).     Lungs clear, vital signs stable, most likely viral URI.  Symptoms less than 1 week.  Covid PCR pending.  Recommending continued symptomatic and supportive care.  Did provide printed prescription for doxycycline to fill if still having symptoms in another 3 to 4 days to treat sinusitis/atypicals.  Discussed strict return precautions. Patient verbalized understanding and is agreeable with plan.  Final Clinical Impressions(s) / UC Diagnoses   Final diagnoses:  Viral URI with cough     Discharge Instructions     Covid swab pending, monitor my chart for results I recommend to continue symptomatic management of your symptoms with continued monitoring for resolution over the next 3 to 4 days.  Most likely viral upper respiratory infection or allergies. Continue Tylenol and ibuprofen as needed for any fevers, body aches, headaches Flonase  nasal spray and cetirizine to help with congestion, sinus inflammation and any postnasal drainage Tessalon/benzonatate every 8 hours for cough Rest and drink plenty of fluids If you do not see any improvement over the next 3 to 4 days you may fill prescription for doxycycline later this week  Please continue to monitor your breathing and temperature, follow-up if symptoms not improving or worsening, developing difficulty breathing or shortness of breath   ED  Prescriptions    Medication Sig Dispense Auth. Provider   fluticasone (FLONASE) 50 MCG/ACT nasal spray Place 1-2 sprays into both nostrils daily for 7 days. 1 g Salima Rumer C, PA-C   Cetirizine HCl 10 MG CAPS Take 1 capsule (10 mg total) by mouth daily. 15 capsule Sherese Heyward C, PA-C   benzonatate (TESSALON) 200 MG capsule Take 1 capsule (200 mg total) by mouth 3 (three) times daily as needed for up to 7 days for cough. 28 capsule Kadeidra Coryell C, PA-C   albuterol (VENTOLIN HFA) 108 (90 Base) MCG/ACT inhaler Inhale 1-2 puffs into the lungs every 6 (six) hours as needed for wheezing or shortness of breath. 8 g [place]ale Wherry C, PA-C   doxycycline (VIBRAMYCIN) 100 MG capsule Take 1 capsule (100 mg total) by mouth 2 (two) times daily for 7 days. 20 capsule Casyn Becvar, [place] C, PA-C     PDMP not reviewed this encounter.   Janith Lima, PA-C 06/24/19 1125

## 2019-10-22 ENCOUNTER — Telehealth (HOSPITAL_COMMUNITY): Payer: Self-pay

## 2019-10-22 NOTE — Telephone Encounter (Signed)
Pt called to get a refill of her Plavix. I've sent a message to our PA to call in at [facility] on [address]. AW

## 2019-10-23 ENCOUNTER — Telehealth: Payer: Self-pay | Admitting: Student

## 2019-10-23 NOTE — Telephone Encounter (Signed)
NIR.  Received call from patient requesting refill for Plavix.  [facility] in [place], [place] [phone]) at [phone] and left VM to fill prescription- Plavix 75 mg tablets, take 1/2 tablet by mouth once every other day, dispense 15 tablets with 3 refills.   Bea Graff Jacquelyn Antony, PA-C 10/23/2019, 11:53 AM

## 2019-11-22 ENCOUNTER — Other Ambulatory Visit (HOSPITAL_COMMUNITY): Payer: Self-pay | Admitting: Interventional Radiology

## 2019-11-22 DIAGNOSIS — I671 Cerebral aneurysm, nonruptured: Secondary | ICD-10-CM

## 2019-12-20 ENCOUNTER — Ambulatory Visit (HOSPITAL_COMMUNITY)
Admission: RE | Admit: 2019-12-20 | Discharge: 2019-12-20 | Disposition: A | Discharge status: Home / Self Care | Payer: 59 | Source: Ambulatory Visit | Attending: Interventional Radiology | Admitting: Interventional Radiology

## 2019-12-20 ENCOUNTER — Other Ambulatory Visit: Payer: Self-pay

## 2019-12-20 DIAGNOSIS — I671 Cerebral aneurysm, nonruptured: Secondary | ICD-10-CM | POA: Diagnosis present

## 2019-12-24 ENCOUNTER — Encounter (HOSPITAL_COMMUNITY): Payer: Self-pay

## 2020-03-19 ENCOUNTER — Other Ambulatory Visit (HOSPITAL_COMMUNITY): Payer: Self-pay | Admitting: Student

## 2020-03-19 MED ORDER — CLOPIDOGREL BISULFATE 75 MG PO TABS: 37.5000 mg | ORAL_TABLET | ORAL | 2 refills | Status: DC

## 2020-04-24 ENCOUNTER — Encounter: Payer: Self-pay | Admitting: Obstetrics and Gynecology

## 2020-04-24 ENCOUNTER — Other Ambulatory Visit (HOSPITAL_COMMUNITY)
Admission: RE | Admit: 2020-04-24 | Discharge: 2020-04-24 | Disposition: A | Discharge status: Home / Self Care | Payer: 59 | Source: Ambulatory Visit | Attending: Obstetrics and Gynecology | Admitting: Obstetrics and Gynecology

## 2020-04-24 ENCOUNTER — Ambulatory Visit (INDEPENDENT_AMBULATORY_CARE_PROVIDER_SITE_OTHER): Payer: 59 | Admitting: Obstetrics and Gynecology

## 2020-04-24 ENCOUNTER — Other Ambulatory Visit: Payer: Self-pay

## 2020-04-24 VITALS — BP 110/70 | HR 72 | Ht 63.0 in | Wt 121.0 lb

## 2020-04-24 DIAGNOSIS — E785 Hyperlipidemia, unspecified: Secondary | ICD-10-CM | POA: Insufficient documentation

## 2020-04-24 DIAGNOSIS — B009 Herpesviral infection, unspecified: Secondary | ICD-10-CM

## 2020-04-24 DIAGNOSIS — Z803 Family history of malignant neoplasm of breast: Secondary | ICD-10-CM

## 2020-04-24 DIAGNOSIS — Z9189 Other specified personal risk factors, not elsewhere classified: Secondary | ICD-10-CM

## 2020-04-24 DIAGNOSIS — Z8632 Personal history of gestational diabetes: Secondary | ICD-10-CM | POA: Diagnosis not present

## 2020-04-24 DIAGNOSIS — Z0000 Encounter for general adult medical examination without abnormal findings: Secondary | ICD-10-CM

## 2020-04-24 DIAGNOSIS — D259 Leiomyoma of uterus, unspecified: Secondary | ICD-10-CM

## 2020-04-24 DIAGNOSIS — Z124 Encounter for screening for malignant neoplasm of cervix: Secondary | ICD-10-CM | POA: Insufficient documentation

## 2020-04-24 DIAGNOSIS — Z800 Family history of malignant neoplasm of digestive organs: Secondary | ICD-10-CM

## 2020-04-24 DIAGNOSIS — M25561 Pain in right knee: Secondary | ICD-10-CM | POA: Insufficient documentation

## 2020-04-24 DIAGNOSIS — Z01419 Encounter for gynecological examination (general) (routine) without abnormal findings: Secondary | ICD-10-CM

## 2020-04-24 DIAGNOSIS — Z3041 Encounter for surveillance of contraceptive pills: Secondary | ICD-10-CM

## 2020-04-24 DIAGNOSIS — N951 Menopausal and female climacteric states: Secondary | ICD-10-CM

## 2020-04-24 DIAGNOSIS — N952 Postmenopausal atrophic vaginitis: Secondary | ICD-10-CM

## 2020-04-24 DIAGNOSIS — K219 Gastro-esophageal reflux disease without esophagitis: Secondary | ICD-10-CM | POA: Insufficient documentation

## 2020-04-24 DIAGNOSIS — G8929 Other chronic pain: Secondary | ICD-10-CM | POA: Insufficient documentation

## 2020-04-24 MED ORDER — VALACYCLOVIR HCL 1 G PO TABS: ORAL_TABLET | ORAL | 2 refills | Status: DC

## 2020-04-24 MED ORDER — NORETHINDRONE 0.35 MG PO TABS: 1.0000 | ORAL_TABLET | Freq: Every day | ORAL | 3 refills | Status: DC

## 2020-04-24 NOTE — Patient Instructions (Addendum)
EXERCISE   We recommended that you start or continue a regular exercise program for good health. Physical activity is anything that gets your body moving, some is better than none. The CDC recommends 150 minutes per week of Moderate-Intensity Aerobic Activity and 2 or more days of Muscle Strengthening Activity.  Benefits of exercise are limitless: helps weight loss/weight maintenance, improves mood and energy, helps with depression and anxiety, improves sleep, tones and strengthens muscles, improves balance, improves bone density, protects from chronic conditions such as heart disease, high blood pressure and diabetes and so much more. To learn more visit: https://www.cdc.gov/physicalactivity/index.html  DIET: Good nutrition starts with a healthy diet of fruits, vegetables, whole grains, and lean protein sources. Drink plenty of water for hydration. Minimize empty calories, sodium, sweets. For more information about dietary recommendations visit: https://health.gov/our-work/nutrition-physical-activity/dietary-guidelines and https://www.myplate.gov/  ALCOHOL:  Women should limit their alcohol intake to no more than 7 drinks/beers/glasses of wine (combined, not each!) per week. Moderation of alcohol intake to this level decreases your risk of breast cancer and liver damage.  If you are concerned that you may have a problem, or your friends have told you they are concerned about your drinking, there are many resources to help. A well-known program that is free, effective, and available to all people all over the nation is Alcoholics Anonymous.  Check out this site to learn more: https://www.aa.org/   CALCIUM AND VITAMIN D:  Adequate intake of calcium and Vitamin D are recommended for bone health.  You should be getting between 1000-1200 mg of calcium and 800 units of Vitamin D daily between diet and supplements  PAP SMEARS:  Pap smears, to check for cervical cancer or precancers,  have traditionally been  done yearly, scientific advances have shown that most women can have pap smears less often.  However, every woman still should have a physical exam from her gynecologist every year. It will include a breast check, inspection of the vulva and vagina to check for abnormal growths or skin changes, a visual exam of the cervix, and then an exam to evaluate the size and shape of the uterus and ovaries. We will also provide age appropriate advice regarding health maintenance, like when you should have certain vaccines, screening for sexually transmitted diseases, bone density testing, colonoscopy, mammograms, etc.   MAMMOGRAMS:  All women over 40 years old should have a routine mammogram.   COLON CANCER SCREENING: Now recommend starting at age 45. At this time colonoscopy is not covered for routine screening until 50. There are take home tests that can be done between 45-49.   COLONOSCOPY:  Colonoscopy to screen for colon cancer is recommended for all women at age 50.  We know, you hate the idea of the prep.  We agree, BUT, having colon cancer and not knowing it is worse!!  Colon cancer so often starts as a polyp that can be seen and removed at colonscopy, which can quite literally save your life!  And if your first colonoscopy is normal and you have no family history of colon cancer, most women don't have to have it again for 10 years.  Once every ten years, you can do something that may end up saving your life, right?  We will be happy to help you get it scheduled when you are ready.  Be sure to check your insurance coverage so you understand how much it will cost.  It may be covered as a preventative service at no cost, but you should check   your particular policy.      Breast Self-Awareness Breast self-awareness means being familiar with how your breasts look and feel. It involves checking your breasts regularly and reporting any changes to your health care provider. Practicing breast self-awareness is  important. A change in your breasts can be a sign of a serious medical problem. Being familiar with how your breasts look and feel allows you to find any problems early, when treatment is more likely to be successful. All women should practice breast self-awareness, including women who have had breast implants. How to do a breast self-exam One way to learn what is normal for your breasts and whether your breasts are changing is to do a breast self-exam. To do a breast self-exam: Look for Changes  1. Remove all the clothing above your waist. 2. Stand in front of a mirror in a room with good lighting. 3. Put your hands on your hips. 4. Push your hands firmly downward. 5. Compare your breasts in the mirror. Look for differences between them (asymmetry), such as: ? Differences in shape. ? Differences in size. ? Puckers, dips, and bumps in one breast and not the other. 6. Look at each breast for changes in your skin, such as: ? Redness. ? Scaly areas. 7. Look for changes in your nipples, such as: ? Discharge. ? Bleeding. ? Dimpling. ? Redness. ? A change in position. Feel for Changes Carefully feel your breasts for lumps and changes. It is best to do this while lying on your back on the floor and again while sitting or standing in the shower or tub with soapy water on your skin. Feel each breast in the following way:  Place the arm on the side of the breast you are examining above your head.  Feel your breast with the other hand.  Start in the nipple area and make  inch (2 cm) overlapping circles to feel your breast. Use the pads of your three middle fingers to do this. Apply light pressure, then medium pressure, then firm pressure. The light pressure will allow you to feel the tissue closest to the skin. The medium pressure will allow you to feel the tissue that is a little deeper. The firm pressure will allow you to feel the tissue close to the ribs.  Continue the overlapping circles,  moving downward over the breast until you feel your ribs below your breast.  Move one finger-width toward the center of the body. Continue to use the  inch (2 cm) overlapping circles to feel your breast as you move slowly up toward your collarbone.  Continue the up and down exam using all three pressures until you reach your armpit.  Write Down What You Find  Write down what is normal for each breast and any changes that you find. Keep a written record with breast changes or normal findings for each breast. By writing this information down, you do not need to depend only on memory for size, tenderness, or location. Write down where you are in your menstrual cycle, if you are still menstruating. If you are having trouble noticing differences in your breasts, do not get discouraged. With time you will become more familiar with the variations in your breasts and more comfortable with the exam. How often should I examine my breasts? Examine your breasts every month. If you are breastfeeding, the best time to examine your breasts is after a feeding or after using a breast pump. If you menstruate, the best time to   examine your breasts is 5-7 days after your period is over. During your period, your breasts are lumpier, and it may be more difficult to notice changes. When should I see my health care provider? See your health care provider if you notice:  A change in shape or size of your breasts or nipples.  A change in the skin of your breast or nipples, such as a reddened or scaly area.  Unusual discharge from your nipples.  A lump or thick area that was not there before.  Pain in your breasts.  Anything that concerns you.   Sciatica  Sciatica is pain, numbness, weakness, or tingling along the path of the sciatic nerve. The sciatic nerve starts in the lower back and runs down the back of each leg. The nerve controls the muscles in the lower leg and in the back of the knee. It also provides  feeling (sensation) to the back of the thigh, the lower leg, and the sole of the foot. Sciatica is a symptom of another medical condition that pinches or puts pressure on the sciatic nerve. Sciatica most often only affects one side of the body. Sciatica usually goes away on its own or with treatment. In some cases, sciatica may come back (recur). What are the causes? This condition is caused by pressure on the sciatic nerve or pinching of the nerve. This may be the result of:  A disk in between the bones of the spine bulging out too far (herniated disk).  Age-related changes in the spinal disks.  A pain disorder that affects a muscle in the buttock.  Extra bone growth near the sciatic nerve.  A break (fracture) of the pelvis.  Pregnancy.  Tumor. This is rare. What increases the risk? The following factors may make you more likely to develop this condition:  Playing sports that place pressure or stress on the spine.  Having poor strength and flexibility.  A history of back injury or surgery.  Sitting for long periods of time.  Doing activities that involve repetitive bending or lifting.  Obesity. What are the signs or symptoms? Symptoms can vary from mild to very severe, and they may include:  Any of these problems in the lower back, leg, hip, or buttock: ? Mild tingling, numbness, or dull aches. ? Burning sensations. ? Sharp pains.  Numbness in the back of the calf or the sole of the foot.  Leg weakness.  Severe back pain that makes movement difficult. Symptoms may get worse when you cough, sneeze, or laugh, or when you sit or stand for long periods of time. How is this diagnosed? This condition may be diagnosed based on:  Your symptoms and medical history.  A physical exam.  Blood tests.  Imaging tests, such as: ? X-rays. ? MRI. ? CT scan. How is this treated? In many cases, this condition improves on its own without treatment. However, treatment may  include:  Reducing or modifying physical activity.  Exercising and stretching.  Icing and applying heat to the affected area.  Medicines that help to: ? Relieve pain and swelling. ? Relax your muscles.  Injections of medicines that help to relieve pain, irritation, and inflammation around the sciatic nerve (steroids).  Surgery. Follow these instructions at home: Medicines  Take over-the-counter and prescription medicines only as told by your health care provider.  Ask your health care provider if the medicine prescribed to you: ? Requires you to avoid driving or using heavy machinery. ? Can cause constipation.  You may need to take these actions to prevent or treat constipation:  Drink enough fluid to keep your urine pale yellow.  Take over-the-counter or prescription medicines.  Eat foods that are high in fiber, such as beans, whole grains, and fresh fruits and vegetables.  Limit foods that are high in fat and processed sugars, such as fried or sweet foods. Managing pain  If directed, put ice on the affected area. ? Put ice in a plastic bag. ? Place a towel between your skin and the bag. ? Leave the ice on for 20 minutes, 2-3 times a day.  If directed, apply heat to the affected area. Use the heat source that your health care provider recommends, such as a moist heat pack or a heating pad. ? Place a towel between your skin and the heat source. ? Leave the heat on for 20-30 minutes. ? Remove the heat if your skin turns bright red. This is especially important if you are unable to feel pain, heat, or cold. You may have a greater risk of getting burned.      Activity  Return to your normal activities as told by your health care provider. Ask your health care provider what activities are safe for you.  Avoid activities that make your symptoms worse.  Take brief periods of rest throughout the day. ? When you rest for longer periods, mix in some mild activity or  stretching between periods of rest. This will help to prevent stiffness and pain. ? Avoid sitting for long periods of time without moving. Get up and move around at least one time each hour.  Exercise and stretch regularly, as told by your health care provider.  Do not lift anything that is heavier than 10 lb (4.5 kg) while you have symptoms of sciatica. When you do not have symptoms, you should still avoid heavy lifting, especially repetitive heavy lifting.  When you lift objects, always use proper lifting technique, which includes: ? Bending your knees. ? Keeping the load close to your body. ? Avoiding twisting.   General instructions  Maintain a healthy weight. Excess weight puts extra stress on your back.  Wear supportive, comfortable shoes. Avoid wearing high heels.  Avoid sleeping on a mattress that is too soft or too hard. A mattress that is firm enough to support your back when you sleep may help to reduce your pain.  Keep all follow-up visits as told by your health care provider. This is important. Contact a health care provider if:  You have pain that: ? Wakes you up when you are sleeping. ? Gets worse when you lie down. ? Is worse than you have experienced in the past. ? Lasts longer than 4 weeks.  You have an unexplained weight loss. Get help right away if:  You are not able to control when you urinate or have bowel movements (incontinence).  You have: ? Weakness in your lower back, pelvis, buttocks, or legs that gets worse. ? Redness or swelling of your back. ? A burning sensation when you urinate. Summary  Sciatica is pain, numbness, weakness, or tingling along the path of the sciatic nerve.  This condition is caused by pressure on the sciatic nerve or pinching of the nerve.  Sciatica can cause pain, numbness, or tingling in the lower back, legs, hips, and buttocks.  Treatment often includes rest, exercise, medicines, and applying ice or heat. This  information is not intended to replace advice given to you by your health  care provider. Make sure you discuss any questions you have with your health care provider. Document Revised: 03/27/2018 Document Reviewed: 03/27/2018 Elsevier Patient Education  2021 [place].   Atrophic Vaginitis  Atrophic vaginitis is a condition in which the tissues that line the vagina become dry and thin. This condition is most common in women who have stopped having regular menstrual periods (are in menopause). This usually starts when a woman is 41 to 52 years old. That is the time when a woman's estrogen levels begin to decrease. Estrogen is a female hormone. It helps to keep the tissues of the vagina moist. It stimulates the vagina to produce a clear fluid that lubricates the vagina for sex. This fluid also protects the vagina from infection. Lack of estrogen can cause the lining of the vagina to get thinner and dryer. The vagina may also shrink in size. It may become less elastic. Atrophic vaginitis tends to get worse over time as a woman's estrogen level drops. What are the causes? This condition is caused by the normal drop in estrogen that happens around the time of menopause. What increases the risk? Certain conditions or situations may lower a woman's estrogen level, leading to a higher risk for atrophic vaginitis. You are more likely to develop this condition if:  You are taking medicines that block estrogen.  You have had your ovaries removed.  You are being treated for cancer with radiation or medicines (chemotherapy).  You have given birth or are breastfeeding.  You are older than age 31.  You smoke. What are the signs or symptoms? Symptoms of this condition include:  Pain, soreness, a feeling of pressure, or bleeding during sex (dyspareunia).  Vaginal burning, irritation, or itching.  Pain or bleeding when a speculum is used in a vaginal exam.  Having burning pain while  urinating.  Vaginal discharge. In some cases, there are no symptoms. How is this diagnosed? This condition is diagnosed based on your medical history and a physical exam. This will include a pelvic exam that checks the vaginal tissues. Though rare, you may also have other tests, including:  A urine test.  A test that checks the acid balance in your vagina (acid balance test). How is this treated? Treatment for this condition depends on how severe your symptoms are. Treatment may include:  Using an over-the-counter vaginal lubricant before sex.  Using a long-acting vaginal moisturizer.  Using low-dose estrogen for moderate to severe symptoms that do not respond to other treatments. Options include creams, tablets, and inserts (vaginal rings). Before you use a vaginal estrogen, tell your health care provider if you have a history of: ? Breast cancer. ? Endometrial cancer. ? Blood clots. If you are not sexually active and your symptoms are very mild, you may not need treatment. Follow these instructions at home: Medicines  Take over-the-counter and prescription medicines only as told by your health care provider.  Do not use herbal or alternative medicines unless your health care provider says that you can.  Use over-the-counter creams, lubricants, or moisturizers for dryness only as told by your health care provider. General instructions  If your atrophic vaginitis is caused by menopause, discuss all of your menopause symptoms and treatment options with your health care provider.  Do not douche.  Do not use products that can make your vagina dry. These include: ? Scented feminine sprays. ? Scented tampons. ? Scented soaps.  Vaginal sex can help to improve blood flow and elasticity of vaginal tissue.  If you choose to have sex and it hurts, try using a water-soluble lubricant or moisturizer right before having sex. Contact a health care provider if:  Your discharge looks  different than normal.  Your vagina has an unusual smell.  You have new symptoms.  Your symptoms do not improve with treatment.  Your symptoms get worse. Summary  Atrophic vaginitis is a condition in which the tissues that line the vagina become dry and thin. It is most common in women who have stopped having regular menstrual periods (are in menopause).  Treatment options include using vaginal lubricants and low-dose vaginal estrogen.  Contact a health care provider if your vagina has an unusual smell, or if your symptoms get worse or do not improve after treatment. This information is not intended to replace advice given to you by your health care provider. Make sure you discuss any questions you have with your health care provider. Document Revised: 09/06/2019 Document Reviewed: 09/06/2019 Elsevier Patient Education  2021 [place].  Menopause Menopause is the normal time of a woman's life when menstrual periods stop completely. It marks the natural end to a woman's ability to become pregnant. It can be defined as the absence of a menstrual period for 12 months without another medical cause. The transition to menopause (perimenopause) most often happens between the ages of 19 and 47, and can last for many years. During perimenopause, hormone levels change in your body, which can cause symptoms and affect your health. Menopause may increase your risk for:  Weakened bones (osteoporosis), which causes fractures.  Depression.  Hardening and narrowing of the arteries (atherosclerosis), which can cause heart attacks and strokes. What are the causes? This condition is usually caused by a natural change in hormone levels that happens as you get older. The condition may also be caused by changes that are not natural, including:  Surgery to remove both ovaries (surgical menopause).  Side effects from some medicines, such as chemotherapy used to treat cancer (chemical menopause). What  increases the risk? This condition is more likely to start at an earlier age if you have certain medical conditions or have undergone treatments, including:  A tumor of the pituitary gland in the brain.  A disease that affects the ovaries and hormones.  Certain cancer treatments, such as chemotherapy or hormone therapy, or radiation therapy on the pelvis.  Heavy smoking and excessive alcohol use.  Family history of early menopause. This condition is also more likely to develop earlier in women who are very thin. What are the signs or symptoms? Symptoms of this condition include:  Hot flashes.  Irregular menstrual periods.  Night sweats.  Changes in feelings about sex. This could be a decrease in sex drive or an increased discomfort around your sexuality.  Vaginal dryness and thinning of the vaginal walls. This may cause painful sex.  Dryness of the skin and development of wrinkles.  Headaches.  Problems sleeping (insomnia).  Mood swings or irritability.  Memory problems.  Weight gain.  Hair growth on the face and chest.  Bladder infections or problems with urinating. How is this diagnosed? This condition is diagnosed based on your medical history, a physical exam, your age, your menstrual history, and your symptoms. Hormone tests may also be done. How is this treated? In some cases, no treatment is needed. You and your health care provider should make a decision together about whether treatment is necessary. Treatment will be based on your individual condition and preferences. Treatment for this  condition focuses on managing symptoms. Treatment may include:  Menopausal hormone therapy (MHT).  Medicines to treat specific symptoms or complications.  Acupuncture.  Vitamin or herbal supplements. Before starting treatment, make sure to let your health care provider know if you have a personal or family history of these conditions:  Heart disease.  Breast  cancer.  Blood clots.  Diabetes.  Osteoporosis. Follow these instructions at home: Lifestyle  Do not use any products that contain nicotine or tobacco, such as cigarettes, e-cigarettes, and chewing tobacco. If you need help quitting, ask your health care provider.  Get at least 30 minutes of physical activity on 5 or more days each week.  Avoid alcoholic and caffeinated beverages, as well as spicy foods. This may help prevent hot flashes.  Get 7-8 hours of sleep each night.  If you have hot flashes, try: ? Dressing in layers. ? Avoiding things that may trigger hot flashes, such as spicy food, warm places, or stress. ? Taking slow, deep breaths when a hot flash starts. ? Keeping a fan in your home and office.  Find ways to manage stress, such as deep breathing, meditation, or journaling.  Consider going to group therapy with other women who are having menopause symptoms. Ask your health care provider about recommended group therapy meetings. Eating and drinking  Eat a healthy, balanced diet that contains whole grains, lean protein, low-fat dairy, and plenty of fruits and vegetables.  Your health care provider may recommend adding more soy to your diet. Foods that contain soy include tofu, tempeh, and soy milk.  Eat plenty of foods that contain calcium and vitamin D for bone health. Items that are rich in calcium include low-fat milk, yogurt, beans, almonds, sardines, broccoli, and kale.   Medicines  Take over-the-counter and prescription medicines only as told by your health care provider.  Talk with your health care provider before starting any herbal supplements. If prescribed, take vitamins and supplements as told by your health care provider. General instructions  Keep track of your menstrual periods, including: ? When they occur. ? How heavy they are and how long they last. ? How much time passes between periods.  Keep track of your symptoms, noting when they start,  how often you have them, and how long they last.  Use vaginal lubricants or moisturizers to help with vaginal dryness and improve comfort during sex.  Keep all follow-up visits. This is important. This includes any group therapy or counseling.   Contact a health care provider if:  You are still having menstrual periods after age 44.  You have pain during sex.  You have not had a period for 12 months and you develop vaginal bleeding. Get help right away if you have:  Severe depression.  Excessive vaginal bleeding.  Pain when you urinate.  A fast or irregular heartbeat (palpitations).  Severe headaches.  Abdominal pain or severe indigestion. Summary  Menopause is a normal time of life when menstrual periods stop completely. It is usually defined as the absence of a menstrual period for 12 months without another medical cause.  The transition to menopause (perimenopause) most often happens between the ages of 18 and 4 and can last for several years.  Symptoms can be managed through medicines, lifestyle changes, and complementary therapies such as acupuncture.  Eat a balanced diet that is rich in nutrients to promote bone health and heart health and to manage symptoms during menopause. This information is not intended to replace advice given to  you by your health care provider. Make sure you discuss any questions you have with your health care provider. Document Revised: 12/07/2019 Document Reviewed: 08/23/2019 Elsevier Patient Education  [place].

## 2020-04-24 NOTE — Progress Notes (Addendum)
50 y.o. G1P1 Single Black or African American Not Hispanic or Latino female here for annual exam.  She is still on POP for cycle control, no bleeding on POP. She has occasional vasomotor symptoms, tolerable. No vaginal dryness.  Not sexually active.     H/O aneurysm, s/p stent placement, on Plavix  She has an elevated risk of breast cancer of 26.8%. FH of colon cancer, needs q 5 year colonoscopies   H/O rare oral hsv  Has a h/o hemorrhoids, has frequent bleeding with BM (1-2 x a week), she will f/u with GI.  No LMP recorded (lmp unknown). (Menstrual status: Oral contraceptives).          Sexually active: No.  The current method of family planning is oral progesterone-only contraceptive.    Exercising: Yes.    stength training and walking  Smoker:  no  Health Maintenance: Pap:  03/09/16 Neg:Neg HR HPV History of abnormal Pap:  Yes, f/u was normal. MMG:  01/22/19 Bilateral MRI - BIRADS 1 negative/density c BMD:   n/a Colonoscopy: ~2020 with [facility], doing every 5 years.  TDaP:  12/04/14  Gardasil: n/a   reports that she has never smoked. She has never used smokeless tobacco. She reports that she does not drink alcohol and does not use drugs. She has a 20year old son, raising him on her own. 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 She is one of 14 children, very close with her siblings. Parents have passed. 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 Abnormal pap   . Cerebral aneurysm without rupture   . Chest pain radiating to arm   . Family history of breast cancer   . Family history of colon cancer   . Family history of heart disease   . Gastroesophageal reflux   . Gestational diabetes   . Hyperlipidemia   . Hypertension   . Mitral valve regurgitation   . PONV (postoperative nausea and vomiting)     Past Surgical History:  Procedure Laterality Date  . BREAST BIOPSY     age 28 left  breast  . CARDIOVASCULAR STRESS TEST  05/19/11   05/19/11 Nuclear stress test ([facility]): Normal study, EF 87%  . CESAREAN SECTION  09/2005  . CT CORONARY CA SCORING  06/23/12   06/23/12 Cardiac CTA ([facility]): Ca score 0. Right dominant. Normal coronaries.  . INTRACRANIAL ANEURYSM REPAIR    . MYOMECTOMY  2005  . RADIOLOGY WITH ANESTHESIA N/A 11/21/2014   Procedure: Embolization;  Surgeon: Luanne Bras, MD;  Location: [place];  Service: Radiology;  Laterality: N/A;  . US ECHOCARDIOGRAPHY  05/18/11   05/18/11 Echo ([place]): NL chamber size, LV systolic function, wall motion. LVEF 77%. Mild LVH. Mild MR/TR. NL PAP.    Current Outpatient Medications  Medication Sig Dispense Refill  . aspirin 81 MG tablet Take 81 mg by mouth daily.    Marland Kitchen atenolol (TENORMIN) 50 MG tablet Take 1 tablet (50 mg total) by mouth daily. 90 tablet 3  . Cetirizine HCl 10 MG CAPS Take 1 capsule (10 mg total) by mouth daily. 15 capsule 0  . clopidogrel (PLAVIX) 75 MG tablet Take 0.5 tablets (37.5 mg total) by mouth every other day. 15 tablet 2  . enalapril (VASOTEC) 5 MG tablet Take 1 tablet (5 mg total) by mouth daily. 90 tablet 3  . fluticasone (FLONASE) 50 MCG/ACT nasal spray  Place 1-2 sprays into both nostrils daily for 7 days. 1 g 0  . hydrocortisone (ANUSOL-HC) 2.5 % rectal cream Place rectally 2 (two) times daily. 30 g 1  . norethindrone (MICRONOR) 0.35 MG tablet TAKE ONE TABLET BY MOUTH DAILY 3 Package 4   No current facility-administered medications for this visit.    Family History  Problem Relation Age of Onset  . Hypertension Mother   . Liver disease Mother   . Colon cancer Father 31  . Throat cancer Father 52  . Heart disease Brother   . Aneurysm Brother   . Heart attack Brother   . Asthma Son   . Aneurysm Sister   . Aneurysm Brother   . Aneurysm Sister   . Heart attack Sister   . Aneurysm Sister 23  . Stroke Maternal Grandmother   . Breast cancer Paternal Aunt 81        metastatic  . Breast cancer Paternal Aunt 56       metastatic  . Colon polyps Brother     Review of Systems  Constitutional: Negative.   HENT: Negative.   Eyes: Negative.   Respiratory: Negative.   Cardiovascular: Negative.   Gastrointestinal: Negative.   Endocrine: Negative.   Genitourinary: Negative.   Musculoskeletal: Negative.   Skin: Negative.   Allergic/Immunologic: Negative.   Neurological: Negative.   Hematological: Negative.   Psychiatric/Behavioral: Negative.     Exam:   BP 110/70 (BP Location: Left Arm, Patient Position: Sitting, Cuff Size: Normal)   Pulse 72   Ht 5\' 3"  (1.6 m)   Wt 121 lb (54.9 kg)   LMP  (LMP Unknown)   BMI 21.43 kg/m   Weight change: @WEIGHTCHANGE @ Height:   Height: 5\' 3"  (160 cm)  Ht Readings from Last 3 Encounters:  04/24/20 5\' 3"  (1.6 m)  04/25/19 5' 3.5" (1.613 m)  04/18/19 5' 3.5" (1.613 m)    General appearance: alert, cooperative and appears stated age Head: Normocephalic, without obvious abnormality, atraumatic Neck: no adenopathy, supple, symmetrical, trachea midline and thyroid normal to inspection and palpation Lungs: clear to auscultation bilaterally Cardiovascular: regular rate and rhythm Breasts: normal appearance, no masses or tenderness Abdomen: soft, non-tender; non distended,  no masses,  no organomegaly Extremities: extremities normal, atraumatic, no cyanosis or edema Skin: Skin color, texture, turgor normal. No rashes or lesions Lymph nodes: Cervical, supraclavicular, and axillary nodes normal. No abnormal inguinal nodes palpated Neurologic: Grossly normal   Pelvic: External genitalia:  no lesions              Urethra:  normal appearing urethra with no masses, tenderness or lesions              Bartholins and Skenes: normal                 Vagina: normal appearing vagina with normal color and discharge, no lesions              Cervix: no lesions               Bimanual Exam:  Uterus:  normal size, contour,  position, consistency, mobility, non-tender              Adnexa: no mass, fullness, tenderness               Rectovaginal: Confirms               Anus:  normal sphincter tone, anal tags  Sharon Mendoza chaperoned for the exam.  1. Well woman exam Discussed breast self exam Discussed calcium and vit D intake Mammogram due Colonoscopy is UTD   2. History of gestational diabetes  - Hemoglobin A1c  3. Uterine leiomyoma, unspecified location   4. Family history of colon cancer   5. Family history of breast cancer   6. Increased risk of breast cancer Will do mammogram now, plan breast MRI 6 months later  7. Laboratory exam ordered as part of routine general medical examination  - CBC - Comprehensive metabolic panel - Lipid panel  8. HSV-1 infection -Valtrex refill sent  9. Encounter for surveillance of contraceptive pills -will continue for one more year - norethindrone (MICRONOR) 0.35 MG tablet; Take 1 tablet (0.35 mg total) by mouth daily.  Dispense: 84 tablet; Refill: 3 - FSH  10. Vaginal atrophy  - FSH  11. Perimenopausal vasomotor symptoms  - FSH  12. Screening for cervical cancer  - Cytology - PAP  Addendum: The patient sent a message that her last colonoscopy was in May of 2019. She has an appointment to see Dr Watt Climes on 05/30/20

## 2020-04-25 LAB — COMPREHENSIVE METABOLIC PANEL
AG Ratio: 1.7 (calc) (ref 1.0–2.5)
ALT: 10 U/L (ref 6–29)
AST: 22 U/L (ref 10–35)
Albumin: 4.4 g/dL (ref 3.6–5.1)
Alkaline phosphatase (APISO): 73 U/L (ref 37–153)
BUN: 14 mg/dL (ref 7–25)
CO2: 26 mmol/L (ref 20–32)
Calcium: 9.9 mg/dL (ref 8.6–10.4)
Chloride: 102 mmol/L (ref 98–110)
Creat: 0.86 mg/dL (ref 0.50–1.05)
Globulin: 2.6 g/dL (calc) (ref 1.9–3.7)
Glucose, Bld: 74 mg/dL (ref 65–99)
Potassium: 5.1 mmol/L (ref 3.5–5.3)
Sodium: 139 mmol/L (ref 135–146)
Total Bilirubin: 0.4 mg/dL (ref 0.2–1.2)
Total Protein: 7 g/dL (ref 6.1–8.1)

## 2020-04-25 LAB — CBC
HCT: 40 % (ref 35.0–45.0)
Hemoglobin: 13.1 g/dL (ref 11.7–15.5)
MCH: 29.4 pg (ref 27.0–33.0)
MCHC: 32.8 g/dL (ref 32.0–36.0)
MCV: 89.9 fL (ref 80.0–100.0)
MPV: 11.7 fL (ref 7.5–12.5)
Platelets: 250 10*3/uL (ref 140–400)
RBC: 4.45 10*6/uL (ref 3.80–5.10)
RDW: 13.2 % (ref 11.0–15.0)
WBC: 5.1 10*3/uL (ref 3.8–10.8)

## 2020-04-25 LAB — LIPID PANEL
Cholesterol: 217 mg/dL — ABNORMAL HIGH (ref <200)
HDL: 71 mg/dL (ref >=50)
LDL Cholesterol (Calc): 131 mg/dL (calc) — ABNORMAL HIGH
Non-HDL Cholesterol (Calc): 146 mg/dL (calc) — ABNORMAL HIGH (ref <130)
Total CHOL/HDL Ratio: 3.1 (calc) (ref <5.0)
Triglycerides: 62 mg/dL (ref <150)

## 2020-04-25 LAB — HEMOGLOBIN A1C
Hgb A1c MFr Bld: 5.6 % of total Hgb (ref <5.7)
Mean Plasma Glucose: 114 mg/dL
eAG (mmol/L): 6.3 mmol/L

## 2020-04-25 LAB — FOLLICLE STIMULATING HORMONE: FSH: 128.4 m[IU]/mL — ABNORMAL HIGH

## 2020-04-28 ENCOUNTER — Encounter: Payer: Self-pay | Admitting: Obstetrics and Gynecology

## 2020-04-29 LAB — CYTOLOGY - PAP
Comment: NEGATIVE
Diagnosis: NEGATIVE
Diagnosis: REACTIVE
High risk HPV: NEGATIVE

## 2020-05-08 ENCOUNTER — Ambulatory Visit: Payer: 59 | Admitting: Cardiology

## 2020-05-08 ENCOUNTER — Encounter: Payer: Self-pay | Admitting: Cardiology

## 2020-05-08 ENCOUNTER — Other Ambulatory Visit: Payer: Self-pay

## 2020-05-08 VITALS — BP 102/66 | HR 77 | Ht 63.0 in | Wt 122.2 lb

## 2020-05-08 DIAGNOSIS — E785 Hyperlipidemia, unspecified: Secondary | ICD-10-CM

## 2020-05-08 DIAGNOSIS — I10 Essential (primary) hypertension: Secondary | ICD-10-CM

## 2020-05-08 DIAGNOSIS — Z79899 Other long term (current) drug therapy: Secondary | ICD-10-CM | POA: Diagnosis not present

## 2020-05-08 DIAGNOSIS — E7801 Familial hypercholesterolemia: Secondary | ICD-10-CM

## 2020-05-08 DIAGNOSIS — I671 Cerebral aneurysm, nonruptured: Secondary | ICD-10-CM

## 2020-05-08 DIAGNOSIS — Z8249 Family history of ischemic heart disease and other diseases of the circulatory system: Secondary | ICD-10-CM

## 2020-05-08 MED ORDER — ATORVASTATIN CALCIUM 20 MG PO TABS: 20.0000 mg | ORAL_TABLET | Freq: Every day | ORAL | 3 refills | Status: DC

## 2020-05-08 NOTE — Assessment & Plan Note (Signed)
Controlled- avoid high K+ foods

## 2020-05-08 NOTE — Patient Instructions (Signed)
Medication Instructions:  START- Atorvastatin 20 mg by mouth daily  *If you need a refill on your cardiac medications before your next appointment, please call your pharmacy*   Lab Work: CMP and Fasting Lipid in 3 Months  If you have labs (blood work) drawn today and your tests are completely normal, you will receive your results only by: Marland Kitchen MyChart Message (if you have MyChart) OR . A paper copy in the mail If you have any lab test that is abnormal or we need to change your treatment, we will call you to review the results.   Testing/Procedures: None Ordered   Follow-Up: At [facility], you and your health needs are our priority.  As part of our continuing mission to provide you with exceptional heart care, we have created designated Provider Care Teams.  These Care Teams include your primary Cardiologist (physician) and Advanced Practice Providers (APPs -  Physician Assistants and Nurse Practitioners) who all work together to provide you with the care you need, when you need it.  We recommend signing up for the patient portal called "MyChart".  Sign up information is provided on this After Visit Summary.  MyChart is used to connect with patients for Virtual Visits (Telemedicine).  Patients are able to view lab/test results, encounter notes, upcoming appointments, etc.  Non-urgent messages can be sent to your provider as well.   To learn more about what you can do with MyChart, go to [email or web address].    Your next appointment:   1 year(s)  The format for your next appointment:   In Person  Provider:   Melina Copa, PA-C

## 2020-05-08 NOTE — Progress Notes (Signed)
Cardiology Office Note:    Date:  05/08/2020   ID:  Sharon Mendoza, DOB 26-May-1970, MRN [id number]  PCP:  Leeroy Cha, MD  Cardiologist:  Quay Burow, MD  Electrophysiologist:  None   Referring MD: Leeroy Cha,*   No chief complaint on file. none  History of Present Illness:    Sharon Mendoza is a pleasant 50 y.o. 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 Dr Estanislado Pandy has her on aspirin and Plavix.   The patient is in the office today for routine 1 year follow-up.  She saw Hinton Dyer done back in February 2021.  Since we saw her last she has done well.  She does have 1 cerebral aneurysm that Dr. Inez Pilgrim is following.  She recently had labs done.  Her potassium was slightly elevated at 5.1.  Her LDL was elevated at 131.  She has a strong family history of coronary disease, her sister had an MI in her 52s, her brother had an MI in his 56s.  The patient denies any chest pain or unusual dyspnea.  She is already on a low-cholesterol healthy diet.  Past Medical History:  Diagnosis Date  . Abnormal pap   . Cerebral aneurysm without rupture   . Chest pain radiating to arm   . Family history of breast cancer   . Family history of colon cancer   . Family history of heart disease   . Gastroesophageal reflux   . Gestational diabetes   . Hyperlipidemia   . Hypertension   . Mitral valve regurgitation   . PONV (postoperative nausea and vomiting)     Past Surgical History:  Procedure Laterality Date  . BREAST BIOPSY     age 39 left breast  . CARDIOVASCULAR STRESS TEST  05/19/11   05/19/11 Nuclear stress test ([facility]): Normal study, EF 87%  . CESAREAN SECTION  09/2005  . CT CORONARY CA SCORING  06/23/12   06/23/12 Cardiac CTA ([facility]): Ca score 0. Right dominant. Normal coronaries.  . INTRACRANIAL ANEURYSM REPAIR    . MYOMECTOMY  2005  .  RADIOLOGY WITH ANESTHESIA N/A 11/21/2014   Procedure: Embolization;  Surgeon: Luanne Bras, MD;  Location: [place];  Service: Radiology;  Laterality: N/A;  . US ECHOCARDIOGRAPHY  05/18/11   05/18/11 Echo ([place]): NL chamber size, LV systolic function, wall motion. LVEF 77%. Mild LVH. Mild MR/TR. NL PAP.    Current Medications: Current Meds  Medication Sig  . atorvastatin (LIPITOR) 20 MG tablet Take 1 tablet (20 mg total) by mouth daily.     Allergies:   Patient has no known allergies.   Social History   Socioeconomic History  . Marital status: Single    Spouse name: Not on file  . Number of children: 1  . Years of education: BS  . Highest education level: Not on file  Occupational History  . Occupation: PHARMACIST    Employer: [employer]  Tobacco Use  . Smoking status: Never Smoker  . Smokeless tobacco: Never Used  Vaping Use  . Vaping Use: Never used  Substance and Sexual Activity  . Alcohol use: No    Alcohol/week: 0.0 standard drinks  . Drug use: No  . Sexual activity: Not Currently    Partners: Male    Birth control/protection: Pill  Other Topics Concern  . Not on file  Social History Narrative  . Not on  file   Social Determinants of Health   Financial Resource Strain: Not on file  Food Insecurity: Not on file  Transportation Needs: Not on file  Physical Activity: Not on file  Stress: Not on file  Social Connections: Not on file     Family History: The patient's family history includes Aneurysm in her brother, brother, sister, and sister; Aneurysm (age of onset: 24) in her sister; Asthma in her son; Breast cancer (age of onset: 61) in her paternal aunt and paternal aunt; Colon cancer (age of onset: 80) in her father; Colon polyps in her brother; Heart attack in her brother and sister; Heart disease in her brother; Hypertension in her mother; Liver disease in her mother; Stroke in her maternal grandmother; Throat cancer (age of onset: 46) in her  father.  ROS:   Please see the history of present illness.     All other systems reviewed and are negative.  EKGs/Labs/Other Studies Reviewed:    The following studies were reviewed today: Negative GXT 11/04/2015  EKG:  EKG is ordered today.  The ekg ordered today demonstrates NSR, HR 77  Recent Labs: 04/24/2020: ALT 10; BUN 14; Creat 0.86; Hemoglobin 13.1; Platelets 250; Potassium 5.1; Sodium 139  Recent Lipid Panel    Component Value Date/Time   CHOL 217 (H) 04/24/2020 1000   CHOL 204 (H) 04/18/2019 0938   TRIG 62 04/24/2020 1000   HDL 71 04/24/2020 1000   HDL 70 04/18/2019 0938   CHOLHDL 3.1 04/24/2020 1000   VLDL 15 12/16/2015 0851   LDLCALC 131 (H) 04/24/2020 1000    Physical Exam:    VS:  BP 102/66   Pulse 77   Ht 5\' 3"  (1.6 m)   Wt 122 lb 3.2 oz (55.4 kg)   LMP  (LMP Unknown)   BMI 21.65 kg/m     Wt Readings from Last 3 Encounters:  05/08/20 122 lb 3.2 oz (55.4 kg)  04/24/20 121 lb (54.9 kg)  04/25/19 122 lb (55.3 kg)     GEN: Well nourished, well developed in no acute distress HEENT: Normal NECK: No JVD; No carotid bruits CARDIAC: RRR, no murmurs, rubs, gallops RESPIRATORY:  Clear to auscultation without rales, wheezing or rhonchi  ABDOMEN: Soft,  non-distended MUSCULOSKELETAL:  No edema; No deformity  SKIN: Warm and dry NEUROLOGIC:  Alert and oriented x 3 PSYCHIATRIC:  Normal affect   ASSESSMENT:    Hyperlipidemia I suggested we start Lipitor 20 mg daily. Goal LDL < 100.  F/U CMET and fasting lipids in 3 months  Family history of coronary artery disease Her sister and brother have a history of premature CAD  Essential hypertension Controlled- avoid high K+ foods  Aneurysm, cerebral, nonruptured Followed by Dr Estanislado Pandy- he has her on aspirin and Plavix  PLAN:    Check CMET and Lipids in 3 months. F/U Dayna Dunn   Medication Adjustments/Labs and Tests Ordered: Current medicines are reviewed at length with the patient today.  Concerns  regarding medicines are outlined above.  Orders Placed This Encounter  Procedures  . Comprehensive Metabolic Panel (CMET)  . Lipid panel  . EKG 12-Lead   Meds ordered this encounter  Medications  . atorvastatin (LIPITOR) 20 MG tablet    Sig: Take 1 tablet (20 mg total) by mouth daily.    Dispense:  90 tablet    Refill:  3    Patient Instructions  Medication Instructions:  START- Atorvastatin 20 mg by mouth daily  *If you need a refill  on your cardiac medications before your next appointment, please call your pharmacy*   Lab Work: CMP and Fasting Lipid in 3 Months  If you have labs (blood work) drawn today and your tests are completely normal, you will receive your results only by: Marland Kitchen MyChart Message (if you have MyChart) OR . A paper copy in the mail If you have any lab test that is abnormal or we need to change your treatment, we will call you to review the results.   Testing/Procedures: None Ordered   Follow-Up: At [facility], you and your health needs are our priority.  As part of our continuing mission to provide you with exceptional heart care, we have created designated Provider Care Teams.  These Care Teams include your primary Cardiologist (physician) and Advanced Practice Providers (APPs -  Physician Assistants and Nurse Practitioners) who all work together to provide you with the care you need, when you need it.  We recommend signing up for the patient portal called "MyChart".  Sign up information is provided on this After Visit Summary.  MyChart is used to connect with patients for Virtual Visits (Telemedicine).  Patients are able to view lab/test results, encounter notes, upcoming appointments, etc.  Non-urgent messages can be sent to your provider as well.   To learn more about what you can do with MyChart, go to [email or web address].    Your next appointment:   1 year(s)  The format for your next appointment:   In Person  Provider:   Melina Copa,  PA-C        Signed, Kerin Ransom, PA-C  05/08/2020 10:57 AM    [place]

## 2020-05-08 NOTE — Assessment & Plan Note (Signed)
Her sister and brother have a history of premature CAD

## 2020-05-08 NOTE — Assessment & Plan Note (Signed)
I suggested we start Lipitor 20 mg daily. Goal LDL < 100.  F/U CMET and fasting lipids in 3 months

## 2020-05-08 NOTE — Assessment & Plan Note (Signed)
Followed by Dr Estanislado Pandy- he has her on aspirin and Plavix

## 2020-05-14 ENCOUNTER — Other Ambulatory Visit: Payer: Self-pay | Admitting: Obstetrics and Gynecology

## 2020-05-14 DIAGNOSIS — Z1231 Encounter for screening mammogram for malignant neoplasm of breast: Secondary | ICD-10-CM

## 2020-06-18 ENCOUNTER — Other Ambulatory Visit: Payer: Self-pay | Admitting: Obstetrics and Gynecology

## 2020-06-18 ENCOUNTER — Encounter: Payer: Self-pay | Admitting: Obstetrics and Gynecology

## 2020-06-18 MED ORDER — MEDROXYPROGESTERONE ACETATE 5 MG PO TABS: 5.0000 mg | ORAL_TABLET | Freq: Every day | ORAL | 0 refills | Status: DC

## 2020-06-18 NOTE — Progress Notes (Signed)
See my chart message

## 2020-06-20 ENCOUNTER — Other Ambulatory Visit: Payer: Self-pay | Admitting: Obstetrics and Gynecology

## 2020-06-30 MED ORDER — ROSUVASTATIN CALCIUM 10 MG PO TABS: 10.0000 mg | ORAL_TABLET | Freq: Every day | ORAL | 3 refills | Status: DC

## 2020-06-30 NOTE — Telephone Encounter (Signed)
If she has been off the atorvastatin for > 1 week and the rash is still unchanged, then it's not from the atorvastatin and she should check with her PCP.  Otherwise, she can switch to rosuvastatin 10 mg daily.

## 2020-06-30 NOTE — Telephone Encounter (Signed)
Spoke with pt on the phone regarding reaction to atorvastatin. Pt states that she does not believe that this reaction can be from anything else except for the atorvastatin. 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 Pt has an appointment with her PCP to look at the skin rash. Per our office pharmacist we can switch over to rosuvastatin for cholesterol management. Pt states that since she has stopped the medication the rash has improved. Pt also states that her siblings have had a similar reaction to atorvastatin. Pt verbalizes understanding.  Prescription for rosuvastatin sent to [facility]: [facility], [place].

## 2020-07-04 ENCOUNTER — Other Ambulatory Visit: Payer: Self-pay

## 2020-07-04 ENCOUNTER — Ambulatory Visit
Admission: RE | Admit: 2020-07-04 | Discharge: 2020-07-04 | Disposition: A | Discharge status: Home / Self Care | Payer: 59 | Source: Ambulatory Visit | Attending: Obstetrics and Gynecology | Admitting: Obstetrics and Gynecology

## 2020-07-04 DIAGNOSIS — Z1231 Encounter for screening mammogram for malignant neoplasm of breast: Secondary | ICD-10-CM

## 2020-07-24 ENCOUNTER — Ambulatory Visit
Admission: RE | Admit: 2020-07-24 | Discharge: 2020-07-24 | Disposition: A | Discharge status: Home / Self Care | Payer: 59 | Source: Ambulatory Visit | Attending: Internal Medicine | Admitting: Internal Medicine

## 2020-07-24 ENCOUNTER — Other Ambulatory Visit: Payer: Self-pay | Admitting: Internal Medicine

## 2020-07-24 DIAGNOSIS — M7989 Other specified soft tissue disorders: Secondary | ICD-10-CM

## 2020-08-11 LAB — COMPREHENSIVE METABOLIC PANEL
ALT: 7 IU/L
AST: 16 IU/L (ref 0–40)
Albumin/Globulin Ratio: 1.7 (ref 1.2–2.2)
Albumin: 4.4 g/dL (ref 3.8–4.9)
Alkaline Phosphatase: 97 IU/L (ref 44–121)
BUN/Creatinine Ratio: 18
BUN: 14 mg/dL
Bilirubin Total: 0.3 mg/dL (ref 0.0–1.2)
CO2: 27 mmol/L (ref 20–29)
Calcium: 10.2 mg/dL
Chloride: 102 mmol/L (ref 96–106)
Creatinine, Ser: 0.8 mg/dL
Globulin, Total: 2.6 g/dL (ref 1.5–4.5)
Glucose: 74 mg/dL (ref 65–99)
Potassium: 4.9 mmol/L (ref 3.5–5.2)
Sodium: 141 mmol/L (ref 134–144)
Total Protein: 7 g/dL (ref 6.0–8.5)
eGFR: 89 mL/min/{1.73_m2} (ref >59)

## 2020-08-11 LAB — LIPID PANEL
Chol/HDL Ratio: 2.9 ratio
Cholesterol, Total: 221 mg/dL
HDL: 75 mg/dL (ref >39)
LDL Chol Calc (NIH): 133 mg/dL
Triglycerides: 76 mg/dL
VLDL Cholesterol Cal: 13 mg/dL (ref 5–40)

## 2020-08-15 ENCOUNTER — Other Ambulatory Visit: Payer: Self-pay

## 2020-08-15 ENCOUNTER — Telehealth: Payer: Self-pay

## 2020-08-15 NOTE — Telephone Encounter (Signed)
Spoke with pt regarding recent lab work done previously ordered by [employer], PA-C. Lab values given to pt. Pt was recently placed on statin therapy and after an adverse reaction to atorvastatin was switched to rosuvastatin. Pt has been on this for about 1.5 months. Per Dr. Gwenlyn Found will obtain a calcium score to risk factor stratify.    While on the phone with pt she reports an episode of pain in her legs and ankles. Pt states that she could see her blue veins at the time of the episode. Pt states she also had pain in her right hip. Pt spoke to her PCP about the episode and her PCP ordered her a vein study to check for DVTs which was negative. Pt was told by her PCP that this could be varicose veins. Pt wants Dr. Kennon Holter recommendations on this. Pt would like to know if she should see a vein specialist, etc?

## 2020-08-28 ENCOUNTER — Telehealth (HOSPITAL_COMMUNITY): Payer: Self-pay | Admitting: Student

## 2020-08-28 MED ORDER — CLOPIDOGREL BISULFATE 75 MG PO TABS: 37.5000 mg | ORAL_TABLET | ORAL | 2 refills | Status: DC

## 2020-08-28 NOTE — Telephone Encounter (Signed)
IR received request to refill Plavix. This medication was e-prescribed to [facility]:  Plavix 75 mg tablets, take 1/2 tablet by mouth once every other day, dispense 15 tablets with two refills.  The patient was notified of the refill being sent in and she knows to call our office with any questions.  Soyla Dryer, [place] [phone] 08/28/2020, 12:22 PM

## 2020-09-03 NOTE — Telephone Encounter (Signed)
Left message for pt to call back  °

## 2020-09-26 ENCOUNTER — Ambulatory Visit (INDEPENDENT_AMBULATORY_CARE_PROVIDER_SITE_OTHER)
Admission: RE | Admit: 2020-09-26 | Discharge: 2020-09-26 | Disposition: A | Discharge status: Home / Self Care | Payer: Self-pay | Source: Ambulatory Visit | Attending: Cardiovascular Disease | Admitting: Cardiovascular Disease

## 2020-09-26 ENCOUNTER — Other Ambulatory Visit: Payer: Self-pay

## 2020-09-26 DIAGNOSIS — I10 Essential (primary) hypertension: Secondary | ICD-10-CM

## 2020-10-14 ENCOUNTER — Telehealth: Payer: Self-pay | Admitting: *Deleted

## 2020-10-14 DIAGNOSIS — Z803 Family history of malignant neoplasm of breast: Secondary | ICD-10-CM

## 2020-10-14 DIAGNOSIS — Z9189 Other specified personal risk factors, not elsewhere classified: Secondary | ICD-10-CM

## 2020-10-14 NOTE — Telephone Encounter (Signed)
Patient last mammogram was on 06/2020, MRI will be in Oct. 2022. Will placed the order closer to this date.

## 2020-10-14 NOTE — Telephone Encounter (Signed)
-----   Message from Salvadore Dom, MD sent at 04/24/2020  9:36 AM EST ----- Please schedule this patient for a breast MRI in ~9/22. She will try and get her mammogram in the next month. Elevated risk of breast cancer. Thanks, Sharee Pimple

## 2020-12-09 NOTE — Telephone Encounter (Signed)
Order placed at [facility] 

## 2020-12-10 NOTE — Telephone Encounter (Signed)
PA obtained.  Encounter closed.

## 2020-12-10 NOTE — Telephone Encounter (Signed)
Breast Mri scheduled on 12/25/20, will route to [facility] to check if prior approval is needed.

## 2020-12-19 MED ORDER — ENALAPRIL MALEATE 5 MG PO TABS: 5.0000 mg | ORAL_TABLET | Freq: Every day | ORAL | 1 refills | Status: DC

## 2020-12-19 MED ORDER — ATENOLOL 50 MG PO TABS: 50.0000 mg | ORAL_TABLET | Freq: Every day | ORAL | 1 refills | Status: DC

## 2020-12-25 ENCOUNTER — Other Ambulatory Visit: Payer: Self-pay

## 2020-12-25 ENCOUNTER — Ambulatory Visit: Payer: 59 | Admitting: Obstetrics and Gynecology

## 2020-12-25 ENCOUNTER — Encounter: Payer: Self-pay | Admitting: Obstetrics and Gynecology

## 2020-12-25 ENCOUNTER — Other Ambulatory Visit: Payer: 59

## 2020-12-25 VITALS — BP 118/68 | HR 61 | Resp 12 | Ht 63.0 in | Wt 121.0 lb

## 2020-12-25 DIAGNOSIS — N763 Subacute and chronic vulvitis: Secondary | ICD-10-CM

## 2020-12-25 DIAGNOSIS — R350 Frequency of micturition: Secondary | ICD-10-CM

## 2020-12-25 DIAGNOSIS — N952 Postmenopausal atrophic vaginitis: Secondary | ICD-10-CM

## 2020-12-25 DIAGNOSIS — N3941 Urge incontinence: Secondary | ICD-10-CM | POA: Diagnosis not present

## 2020-12-25 LAB — WET PREP FOR TRICH, YEAST, CLUE

## 2020-12-25 MED ORDER — BETAMETHASONE VALERATE 0.1 % EX OINT: TOPICAL_OINTMENT | CUTANEOUS | 0 refills | Status: DC

## 2020-12-25 NOTE — Progress Notes (Signed)
GYNECOLOGY  VISIT   HPI: 50 y.o.   G1P1 Single Black or African American Not Hispanic or Latino  female   G1P1 with Patient's last menstrual period was 01/21/2020.   here for she is having a lot of vaginal discomfort. She is not sexually active.  She c/o a 2 month itching and irritation near her clitoris. No help with monistat.   She c/o frequent urination, small to normal amounts. Can void every 2 hours. She drinks 12 oz of coffee.  No dysuria. She is having a small amount of urge incontinence 1-2 x a week. This started in the last 3-4 months. She does report that her urine stream isn't straight.   No vaginal bleeding. She stopped micronor over 6 months ago. Not sexually active.   GYNECOLOGIC HISTORY: Patient's last menstrual period was 01/21/2020. Contraception:none  Menopausal hormone therapy: none         OB History     Gravida  1   Para  1   Term      Preterm      AB      Living  1      SAB      IAB      Ectopic      Multiple      Live Births                 Patient Active Problem List   Diagnosis Date Noted   Family history of coronary artery disease 05/08/2020   Chronic pain 04/24/2020   Dyslipidemia 04/24/2020   Gastroesophageal reflux disease without esophagitis 04/24/2020   Pain in right knee 04/24/2020   History of cerebral aneurysm 04/17/2018   Genetic testing 06/08/2017   Family history of colon cancer    Family history of breast cancer    Medication management 05/13/2015   Essential hypertension 02/18/2015   Hyperlipidemia 02/18/2015   Chest pain 02/18/2015   Brain aneurysm 11/21/2014   Pain in joint, shoulder region 12/10/2013   LBP radiating to left leg 12/10/2013   Aneurysm, cerebral, nonruptured 12/10/2013   Fibroid uterus 06/22/2012   Menorrhagia 06/22/2012   Dysmenorrhea 06/22/2012    Past Medical History:  Diagnosis Date   Abnormal pap    Cerebral aneurysm without rupture    Chest pain radiating to arm    Family  history of breast cancer    Family history of colon cancer    Family history of heart disease    Gastroesophageal reflux    Gestational diabetes    Hyperlipidemia    Hypertension    Mitral valve regurgitation    PONV (postoperative nausea and vomiting)     Past Surgical History:  Procedure Laterality Date   BREAST BIOPSY     age 89 left breast   CARDIOVASCULAR STRESS TEST  05/19/11   05/19/11 Nuclear stress test ([facility]): Normal study, EF 87%   CESAREAN SECTION  09/2005   CT CORONARY CA SCORING  06/23/12   06/23/12 Cardiac CTA ([facility]): Ca score 0. Right dominant. Normal coronaries.   INTRACRANIAL ANEURYSM REPAIR     MYOMECTOMY  2005   RADIOLOGY WITH ANESTHESIA N/A 11/21/2014   Procedure: Embolization;  Surgeon: Luanne Bras, MD;  Location: [place];  Service: Radiology;  Laterality: N/A;   US ECHOCARDIOGRAPHY  05/18/11   05/18/11 Echo ([place]): NL chamber size, LV systolic function, wall motion. LVEF 77%. Mild LVH. Mild MR/TR. NL PAP.    Current Outpatient Medications  Medication Sig  Dispense Refill   aspirin 81 MG tablet Take 81 mg by mouth daily.     atenolol (TENORMIN) 50 MG tablet Take 1 tablet (50 mg total) by mouth daily. 90 tablet 1   Cetirizine HCl 10 MG CAPS Take 1 capsule (10 mg total) by mouth daily. 15 capsule 0   clopidogrel (PLAVIX) 75 MG tablet Take 0.5 tablets (37.5 mg total) by mouth every other day. 15 tablet 2   enalapril (VASOTEC) 5 MG tablet Take 1 tablet (5 mg total) by mouth daily. 90 tablet 1   valACYclovir (VALTREX) 1000 MG tablet Take 2 tablets po q 12 hours for 2 doses. 30 tablet 2   rosuvastatin (CRESTOR) 10 MG tablet Take 1 tablet (10 mg total) by mouth daily. 90 tablet 3   No current facility-administered medications for this visit.     ALLERGIES: Patient has no known allergies.  Family History  Problem Relation Age of Onset   Hypertension Mother    Liver disease Mother    Colon cancer Father 41   Throat cancer  Father 64   Heart disease Brother    Aneurysm Brother    Heart attack Brother    Asthma Son    Aneurysm Sister    Aneurysm Brother    Aneurysm Sister    Heart attack Sister    Aneurysm Sister 18   Stroke Maternal Grandmother    Breast cancer Paternal Aunt 37       metastatic   Breast cancer Paternal Aunt 78       metastatic   Colon polyps Brother    Breast cancer Cousin    Breast cancer Paternal Aunt     Social History   Socioeconomic History   Marital status: Single    Spouse name: Not on file   Number of children: 1   Years of education: BS   Highest education level: Not on file  Occupational History   Occupation: PHARMACIST    Employer: [employer]  Tobacco Use   Smoking status: Never   Smokeless tobacco: Never  Vaping Use   Vaping Use: Never used  Substance and Sexual Activity   Alcohol use: No    Alcohol/week: 0.0 standard drinks   Drug use: No   Sexual activity: Not Currently    Partners: Male    Birth control/protection: Pill  Other Topics Concern   Not on file  Social History Narrative   Not on file   Social Determinants of Health   Financial Resource Strain: Not on file  Food Insecurity: Not on file  Transportation Needs: Not on file  Physical Activity: Not on file  Stress: Not on file  Social Connections: Not on file  Intimate Partner Violence: Not on file    Review of Systems  Genitourinary:        Vaginal dryness.   All other systems reviewed and are negative.  PHYSICAL EXAMINATION:    BP 118/68   Pulse 61   Resp 12   Ht 5\' 3"  (1.6 m)   Wt 121 lb (54.9 kg)   LMP 01/21/2020   BMI 21.43 kg/m     General appearance: alert, cooperative and appears stated age Pelvic: External genitalia:  no lesions, slight erythema and a very small fissure at the clitoris. No whitening, no plaques.               Urethra:  normal appearing urethra with no masses, tenderness or lesions  Bartholins and Skenes: normal                  Vagina: atrophic appearing vagina with a slight amount of thin, white vaginal discharge              Cervix: no lesions               Chaperone was present for exam.  1. Chronic vulvitis Discussed vulvar skin care, information given - WET PREP FOR TRICH, YEAST, CLUE: negative - betamethasone valerate ointment (VALISONE) 0.1 %; Apply a pea sized amount topically BID for up to 2 weeks as needed  Dispense: 30 g; Refill: 0 - SureSwab Advanced Vaginitis, TMA  2. Vaginal atrophy Not currently symptomatic.  Discussed use of Replens vaginal moisturizer if needed If she becomes sexually active she may need vaginal estrogen  3. Urge incontinence Cut back on caffeine - Urinalysis,Complete w/RFL Culture  4. Urinary frequency Cut back on caffeine.  - Urinalysis,Complete w/RFL Culture

## 2020-12-25 NOTE — Patient Instructions (Signed)
Urinary Incontinence Urinary incontinence refers to a condition in which a person is unable to control where and when to pass urine. A person with this condition will urinate when he or she does not mean to (involuntarily). What are the causes? This condition may be caused by: Medicines. Infections. Constipation. Overactive bladder muscles. Weak bladder muscles. Weak pelvic floor muscles. These muscles provide support for the bladder, intestine, and, in women, the uterus. Enlarged prostate in men. The prostate is a gland near the bladder. When it gets too big, it can pinch the urethra. With the urethra blocked, the bladder can weaken and lose the ability to empty properly. Surgery. Emotional factors, such as anxiety, stress, or post-traumatic stress disorder (PTSD). Pelvic organ prolapse. This happens in women when organs shift out of place and into the vagina. This shift can prevent the bladder and urethra from working properly. What increases the risk? The following factors may make you more likely to develop this condition: Older age. Obesity and physical inactivity. Pregnancy and childbirth. Menopause. Diseases that affect the nerves or spinal cord (neurological diseases). Long-term (chronic) coughing. This can increase pressure on the bladder and pelvic floor muscles. What are the signs or symptoms? Symptoms may vary depending on the type of urinary incontinence you have. They include: A sudden urge to urinate, but passing urine involuntarily before you can get to a bathroom (urge incontinence). Suddenly passing urine with any activity that forces urine to pass, such as coughing, laughing, exercise, or sneezing (stress incontinence). Needing to urinate often, but urinating only a small amount, or constantly dribbling urine (overflow incontinence). Urinating because you cannot get to the bathroom in time due to a physical disability, such as arthritis or injury, or communication and  thinking problems, such as Alzheimer disease (functional incontinence). How is this diagnosed? This condition may be diagnosed based on: Your medical history. A physical exam. Tests, such as: Urine tests. X-rays of your kidney and bladder. Ultrasound. CT scan. Cystoscopy. In this procedure, a health care provider inserts a tube with a light and camera (cystoscope) through the urethra and into the bladder in order to check for problems. Urodynamic testing. These tests assess how well the bladder, urethra, and sphincter can store and release urine. There are different types of urodynamic tests, and they vary depending on what the test is measuring. To help diagnose your condition, your health care provider may recommend that you keep a log of when you urinate and how much you urinate. How is this treated? Treatment for this condition depends on the type of incontinence that you have and its cause. Treatment may include: Lifestyle changes, such as: Quitting smoking. Maintaining a healthy weight. Staying active. Try to get 150 minutes of moderate-intensity exercise every week. Ask your health care provider which activities are safe for you. Eating a healthy diet. Avoid high-fat foods, like fried foods. Avoid refined carbohydrates like white bread and white rice. Limit how much alcohol and caffeine you drink. Increase your fiber intake. Foods such as fresh fruits, vegetables, beans, and whole grains are healthy sources of fiber. Pelvic floor muscle exercises. Bladder training, such as lengthening the amount of time between bathroom breaks, or using the bathroom at regular intervals. Using techniques to suppress bladder urges. This can include distraction techniques or controlled breathing exercises. Medicines to relax the bladder muscles and prevent bladder spasms. Medicines to help slow or prevent the growth of a man's prostate. Botox injections. These can help relax the bladder  muscles. Using   pulses of electricity to help change bladder reflexes (electrical nerve stimulation). For women, using a medical device to prevent urine leaks. This is a small, tampon-like, disposable device that is inserted into the urethra. Injecting collagen or carbon beads (bulking agents) into the urinary sphincter. These can help thicken tissue and close the bladder opening. Surgery. Follow these instructions at home: Lifestyle Limit alcohol and caffeine. These can fill your bladder quickly and irritate it. Keep yourself clean to help prevent odors and skin damage. Ask your doctor about special skin creams and cleansers that can protect the skin from urine. Consider wearing pads or adult diapers. Make sure to change them regularly, and always change them right after experiencing incontinence. General instructions Take over-the-counter and prescription medicines only as told by your health care provider. Use the bathroom about every 3-4 hours, even if you do not feel the need to urinate. Try to empty your bladder completely every time. After urinating, wait a minute. Then try to urinate again. Make sure you are in a relaxed position while urinating. If your incontinence is caused by nerve problems, keep a log of the medicines you take and the times you go to the bathroom. Keep all follow-up visits as told by your health care provider. This is important. Contact a health care provider if: You have pain that gets worse. Your incontinence gets worse. Get help right away if: You have a fever or chills. You are unable to urinate. You have redness in your groin area or down your legs. Summary Urinary incontinence refers to a condition in which a person is unable to control where and when to pass urine. This condition may be caused by medicines, infection, weak bladder muscles, weak pelvic floor muscles, enlargement of the prostate (in men), or surgery. The following factors increase your risk  for developing this condition: older age, obesity, pregnancy and childbirth, menopause, neurological diseases, and chronic coughing. There are several types of urinary incontinence. They include urge incontinence, stress incontinence, overflow incontinence, and functional incontinence. This condition is usually treated first with lifestyle and behavioral changes, such as quitting smoking, eating a healthier diet, and doing regular pelvic floor exercises. Other treatment options include medicines, bulking agents, medical devices, electrical nerve stimulation, or surgery. This information is not intended to replace advice given to you by your health care provider. Make sure you discuss any questions you have with your health care provider. Document Revised: 03/18/2017 Document Reviewed: 06/17/2016 Elsevier Patient Education  [place]. Kegel Exercises Kegel exercises can help strengthen your pelvic floor muscles. The pelvic floor is a group of muscles that support your rectum, small intestine, and bladder. In females, pelvic floor muscles also help support the womb (uterus). These muscles help you control the flow of urine and stool. Kegel exercises are painless and simple, and they do not require any equipment. Your provider may suggest Kegel exercises to: Improve bladder and bowel control. Improve sexual response. Improve weak pelvic floor muscles after surgery to remove the uterus (hysterectomy) or pregnancy (females). Improve weak pelvic floor muscles after prostate gland removal or surgery (males). Kegel exercises involve squeezing your pelvic floor muscles, which are the same muscles you squeeze when you try to stop the flow of urine or keep from passing gas. The exercises can be done while sitting, standing, or lying down, but it is best to vary your position. Exercises How to do Kegel exercises: Squeeze your pelvic floor muscles tight. You should feel a tight lift in your  rectal area.  If you are a female, you should also feel a tightness in your vaginal area. Keep your stomach, buttocks, and legs relaxed. Hold the muscles tight for up to 10 seconds. Breathe normally. Relax your muscles. Repeat as told by your health care provider. Repeat this exercise daily as told by your health care provider. Continue to do this exercise for at least 4-6 weeks, or for as long as told by your health care provider. You may be referred to a physical therapist who can help you learn more about how to do Kegel exercises. Depending on your condition, your health care provider may recommend: Varying how long you squeeze your muscles. Doing several sets of exercises every day. Doing exercises for several weeks. Making Kegel exercises a part of your regular exercise routine. This information is not intended to replace advice given to you by your health care provider. Make sure you discuss any questions you have with your health care provider. Document Revised: 02/27/2020 Document Reviewed: 10/26/2017 Elsevier Patient Education  [place].

## 2020-12-26 LAB — SURESWAB® ADVANCED VAGINITIS,TMA
CANDIDA SPECIES: NOT DETECTED
Candida glabrata: NOT DETECTED
SURESWAB(R) ADV BACTERIAL VAGINOSIS(BV),TMA: POSITIVE — AB
TRICHOMONAS VAGINALIS (TV),TMA: NOT DETECTED

## 2020-12-28 LAB — URINALYSIS, COMPLETE W/RFL CULTURE
Bilirubin Urine: NEGATIVE
Glucose, UA: NEGATIVE
Hgb urine dipstick: NEGATIVE
Hyaline Cast: NONE SEEN /LPF
Ketones, ur: NEGATIVE
Nitrites, Initial: NEGATIVE
Protein, ur: NEGATIVE
RBC / HPF: NONE SEEN /HPF (ref 0–2)
Specific Gravity, Urine: 1.017 (ref 1.001–1.035)
pH: 8.5 — ABNORMAL HIGH (ref 5.0–8.0)

## 2020-12-28 LAB — URINE CULTURE
MICRO NUMBER:: 12469949
SPECIMEN QUALITY:: ADEQUATE

## 2020-12-28 LAB — CULTURE INDICATED

## 2020-12-29 ENCOUNTER — Other Ambulatory Visit: Payer: Self-pay

## 2020-12-29 ENCOUNTER — Telehealth: Payer: Self-pay

## 2020-12-29 MED ORDER — METRONIDAZOLE 500 MG PO TABS: 500.0000 mg | ORAL_TABLET | Freq: Two times a day (BID) | ORAL | 0 refills | Status: DC

## 2020-12-29 MED ORDER — FLUCONAZOLE 150 MG PO TABS: 150.0000 mg | ORAL_TABLET | Freq: Once | ORAL | 0 refills | Status: AC

## 2020-12-29 MED ORDER — SULFAMETHOXAZOLE-TRIMETHOPRIM 800-160 MG PO TABS: 1.0000 | ORAL_TABLET | Freq: Two times a day (BID) | ORAL | 0 refills | Status: DC

## 2020-12-29 NOTE — Telephone Encounter (Signed)
1 diflucan sent to her pharmacy.

## 2020-12-29 NOTE — Telephone Encounter (Signed)
-----   Message from Salvadore Dom, MD sent at 12/29/2020 11:36 AM EDT ----- Please let the patient know that she does have a UTI and call in Bactrim DS, 1 po BID x 3 days.

## 2020-12-29 NOTE — Telephone Encounter (Signed)
Metronidazole 500 bid x 7 days and Septra DS # 6 bid x 3 days sent to pharmacy for patient.   Patient asked if you would send a Diflucan tablet as she feels certain these meds will cause her a vaginal yeast infection.

## 2021-01-04 ENCOUNTER — Ambulatory Visit
Admission: RE | Admit: 2021-01-04 | Discharge: 2021-01-04 | Disposition: A | Discharge status: Home / Self Care | Payer: 59 | Source: Ambulatory Visit | Attending: Obstetrics and Gynecology | Admitting: Obstetrics and Gynecology

## 2021-01-04 ENCOUNTER — Other Ambulatory Visit: Payer: Self-pay

## 2021-01-04 DIAGNOSIS — Z9189 Other specified personal risk factors, not elsewhere classified: Secondary | ICD-10-CM

## 2021-01-04 DIAGNOSIS — Z803 Family history of malignant neoplasm of breast: Secondary | ICD-10-CM

## 2021-01-04 MED ORDER — GADOBUTROL 1 MMOL/ML IV SOLN
5.0000 mL | Freq: Once | INTRAVENOUS | Status: AC | PRN
  Administered 2021-01-04: 5 mL via INTRAVENOUS

## 2021-01-20 ENCOUNTER — Other Ambulatory Visit (HOSPITAL_COMMUNITY): Payer: Self-pay | Admitting: Interventional Radiology

## 2021-01-20 DIAGNOSIS — I671 Cerebral aneurysm, nonruptured: Secondary | ICD-10-CM

## 2021-02-05 ENCOUNTER — Ambulatory Visit (HOSPITAL_COMMUNITY)
Admission: RE | Admit: 2021-02-05 | Discharge: 2021-02-05 | Disposition: A | Discharge status: Home / Self Care | Payer: 59 | Source: Ambulatory Visit | Attending: Interventional Radiology | Admitting: Interventional Radiology

## 2021-02-05 ENCOUNTER — Other Ambulatory Visit: Payer: Self-pay

## 2021-02-05 DIAGNOSIS — I671 Cerebral aneurysm, nonruptured: Secondary | ICD-10-CM | POA: Insufficient documentation

## 2021-02-10 ENCOUNTER — Telehealth (HOSPITAL_COMMUNITY): Payer: Self-pay

## 2021-02-10 NOTE — Telephone Encounter (Signed)
Pt agreed to f/u in 1 year with mra. AW  °

## 2021-03-10 ENCOUNTER — Other Ambulatory Visit (HOSPITAL_COMMUNITY): Payer: Self-pay | Admitting: Physician Assistant

## 2021-03-10 ENCOUNTER — Telehealth (HOSPITAL_COMMUNITY): Payer: Self-pay | Admitting: Physician Assistant

## 2021-03-10 ENCOUNTER — Telehealth (HOSPITAL_COMMUNITY): Payer: Self-pay

## 2021-03-10 MED ORDER — CLOPIDOGREL BISULFATE 75 MG PO TABS: 37.5000 mg | ORAL_TABLET | ORAL | 2 refills | Status: DC

## 2021-03-10 NOTE — Progress Notes (Signed)
Patient ID: Sharon Mendoza, female   DOB: 1970/04/11, 50 y.o.   MRN: [id number]  IR received request to refill Plavix. This medication was e-prescribed to [facility]:   Plavix 75 mg tablets, take 1/2 tablet by mouth once every other day, dispense 15 tablets with two refills.    Electronically Signed: Pasty Spillers, PA 03/10/2021, 3:20 PM

## 2021-03-10 NOTE — Telephone Encounter (Signed)
Returned pt's call, i've sent a message to our PA to call in Plavix refill. AW

## 2021-04-20 NOTE — Progress Notes (Signed)
51 y.o. G1P1 Single Black or African American Not Hispanic or Latino female here for annual exam.  She went off of the minipill ~ 1 year ago. No bleeding. She is having some increase in vasomotor symptoms. She is having night sweats ~1 x a night 5-6/7 x a week. She is having hot flashes 2-3 x a day. It is tolerable with a fan.  Not sexually active.   She is having right leg and foot pain.    H/O aneurysm, s/p stent placement, on Plavix   She has an elevated risk of breast cancer of 26.8%. FH of colon cancer, needs q 5 year colonoscopies    H/O rare oral hsv.   No LMP recorded (lmp unknown). Patient is postmenopausal.          Sexually active: No.  The current method of family planning is post menopausal status.    Exercising: Yes.     walking Smoker:  no  Health Maintenance: Pap: 04/24/20 WNL Hr HPV Neg; 03/09/16 Neg:Neg HR HPV History of abnormal Pap:  yes f/u was normal  MMG:  07/04/20 Bi-Rad 1, cat d Breast MRI: 01/05/21  Bi-rads 1 neg  BMD:   n/a  Colonoscopy: 2020 with [facility], doing every 5 years (she thinks she is due now, she will call).  TDaP:  12/04/14  Gardasil: n/a   reports that she has never smoked. She has never used smokeless tobacco. She reports that she does not drink alcohol and does not use drugs. 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 Son is 68, raising him on her own. Parents have passed. She is one of 14 children, very close with her siblings.    Past Medical History:  Diagnosis Date   Abnormal pap    Cerebral aneurysm without rupture    Chest pain radiating to arm    Family history of breast cancer    Family history of colon cancer    Family history of heart disease    Gastroesophageal reflux    Gestational diabetes    Hyperlipidemia    Hypertension    Mitral valve regurgitation    PONV (postoperative nausea and vomiting)     Past Surgical History:  Procedure Laterality Date   BREAST  BIOPSY     age 1 left breast   CARDIOVASCULAR STRESS TEST  05/19/11   05/19/11 Nuclear stress test ([facility]): Normal study, EF 87%   CESAREAN SECTION  09/2005   CT CORONARY CA SCORING  06/23/12   06/23/12 Cardiac CTA ([place] [facility]): Ca score 0. Right dominant. Normal coronaries.   INTRACRANIAL ANEURYSM REPAIR     MYOMECTOMY  2005   RADIOLOGY WITH ANESTHESIA N/A 11/21/2014   Procedure: Embolization;  Surgeon: Luanne Bras, MD;  Location: [place];  Service: Radiology;  Laterality: N/A;   US ECHOCARDIOGRAPHY  05/18/11   05/18/11 Echo ([facility]): NL chamber size, LV systolic function, wall motion. LVEF 77%. Mild LVH. Mild MR/TR. NL PAP.    Current Outpatient Medications  Medication Sig Dispense Refill   aspirin 81 MG tablet Take 81 mg by mouth daily.     atenolol (TENORMIN) 50 MG tablet Take 1 tablet (50 mg total) by mouth daily. 90 tablet 1   betamethasone valerate ointment (VALISONE) 0.1 % Apply a pea sized amount topically BID for up to 2 weeks as needed 30 g 0   Cetirizine HCl 10 MG CAPS Take 1 capsule (10 mg total)  by mouth daily. 15 capsule 0   clopidogrel (PLAVIX) 75 MG tablet Take 0.5 tablets (37.5 mg total) by mouth every other day. 15 tablet 2   enalapril (VASOTEC) 5 MG tablet Take 1 tablet (5 mg total) by mouth daily. 90 tablet 1   valACYclovir (VALTREX) 1000 MG tablet Take 2 tablets po q 12 hours for 2 doses. 30 tablet 2   rosuvastatin (CRESTOR) 10 MG tablet Take 1 tablet (10 mg total) by mouth daily. 90 tablet 3   No current facility-administered medications for this visit.    Family History  Problem Relation Age of Onset   Hypertension Mother    Liver disease Mother    Colon cancer Father 82   Throat cancer Father 3   Heart disease Brother    Aneurysm Brother    Heart attack Brother    Asthma Son    Aneurysm Sister    Aneurysm Brother    Aneurysm Sister    Heart attack Sister    Aneurysm Sister 18   Stroke Maternal Grandmother    Breast cancer  Paternal Aunt 16       metastatic   Breast cancer Paternal Aunt 73       metastatic   Colon polyps Brother    Breast cancer Cousin    Breast cancer Paternal Aunt     Review of Systems  All other systems reviewed and are negative.  Exam:   BP 120/78    Pulse 70    Ht 5\' 3"  (1.6 m)    Wt 121 lb (54.9 kg)    LMP  (LMP Unknown)    SpO2 100%    BMI 21.43 kg/m   Weight change: @WEIGHTCHANGE @ Height:   Height: 5\' 3"  (160 cm)  Ht Readings from Last 3 Encounters:  04/30/21 5\' 3"  (1.6 m)  12/25/20 5\' 3"  (1.6 m)  05/08/20 5\' 3"  (1.6 m)    General appearance: alert, cooperative and appears stated age Head: Normocephalic, without obvious abnormality, atraumatic Neck: no adenopathy, supple, symmetrical, trachea midline and thyroid normal to inspection and palpation Lungs: clear to auscultation bilaterally Cardiovascular: regular rate and rhythm Breasts: normal appearance, no masses or tenderness Abdomen: soft, non-tender; non distended,  no masses,  no organomegaly Extremities: extremities normal, atraumatic, no cyanosis or edema Skin: Skin color, texture, turgor normal. No rashes or lesions Lymph nodes: Cervical, supraclavicular, and axillary nodes normal. No abnormal inguinal nodes palpated Neurologic: Grossly normal   Pelvic: External genitalia:  no lesions              Urethra:  normal appearing urethra with no masses, tenderness or lesions              Bartholins and Skenes: normal                 Vagina: atrophic appearing vagina with normal color and discharge, no lesions              Cervix: no lesions               Bimanual Exam:  Uterus:  normal size, contour, position, consistency, mobility, non-tender              Adnexa: no mass, fullness, tenderness               Rectovaginal: Confirms               Anus:  normal sphincter tone, no lesions  Gae Dry chaperoned for the exam.  1. Well woman exam Discussed calcium and vit D intake No pap this year  2. Vasomotor  symptoms due to menopause Discussed behavioral changes, over the counter options. She will reach out if her symptoms worsen. Will try and avoid HRT  3. Increased risk of breast cancer Mammogram in 4/23, breast MRI next fall (reviewed gallium deposits) Continue monthly breast self exams  4. History of gestational diabetes - Hemoglobin A1c  5. Family history of colon cancer She will check when her colonoscopy is due  6. HSV-1 infection Rare outbreaks - valACYclovir (VALTREX) 1000 MG tablet; Take 2 tablets po q 12 hours for 2 doses.  Dispense: 30 tablet; Refill: 2  7. Constipation, unspecified constipation type - TSH  8. Other fatigue - CBC - TSH  9. Laboratory exam ordered as part of routine general medical examination - CBC - Comprehensive metabolic panel - Lipid panel  10. Elevated LDL cholesterol level - Lipid panel

## 2021-04-30 ENCOUNTER — Encounter: Payer: Self-pay | Admitting: Obstetrics and Gynecology

## 2021-04-30 ENCOUNTER — Other Ambulatory Visit: Payer: Self-pay

## 2021-04-30 ENCOUNTER — Ambulatory Visit (INDEPENDENT_AMBULATORY_CARE_PROVIDER_SITE_OTHER): Payer: 59 | Admitting: Obstetrics and Gynecology

## 2021-04-30 VITALS — BP 120/78 | HR 70 | Ht 63.0 in | Wt 121.0 lb

## 2021-04-30 DIAGNOSIS — N951 Menopausal and female climacteric states: Secondary | ICD-10-CM

## 2021-04-30 DIAGNOSIS — I7300 Raynaud's syndrome without gangrene: Secondary | ICD-10-CM | POA: Insufficient documentation

## 2021-04-30 DIAGNOSIS — I83813 Varicose veins of bilateral lower extremities with pain: Secondary | ICD-10-CM | POA: Insufficient documentation

## 2021-04-30 DIAGNOSIS — Z800 Family history of malignant neoplasm of digestive organs: Secondary | ICD-10-CM

## 2021-04-30 DIAGNOSIS — Z9189 Other specified personal risk factors, not elsewhere classified: Secondary | ICD-10-CM | POA: Diagnosis not present

## 2021-04-30 DIAGNOSIS — K5900 Constipation, unspecified: Secondary | ICD-10-CM

## 2021-04-30 DIAGNOSIS — B009 Herpesviral infection, unspecified: Secondary | ICD-10-CM

## 2021-04-30 DIAGNOSIS — R5383 Other fatigue: Secondary | ICD-10-CM

## 2021-04-30 DIAGNOSIS — Z01419 Encounter for gynecological examination (general) (routine) without abnormal findings: Secondary | ICD-10-CM

## 2021-04-30 DIAGNOSIS — Z0000 Encounter for general adult medical examination without abnormal findings: Secondary | ICD-10-CM

## 2021-04-30 DIAGNOSIS — E7800 Pure hypercholesterolemia, unspecified: Secondary | ICD-10-CM

## 2021-04-30 DIAGNOSIS — Z8632 Personal history of gestational diabetes: Secondary | ICD-10-CM

## 2021-04-30 LAB — CBC
HCT: 40.2 % (ref 35.0–45.0)
Hemoglobin: 12.9 g/dL (ref 11.7–15.5)
MCH: 28.9 pg (ref 27.0–33.0)
MCHC: 32.1 g/dL (ref 32.0–36.0)
MCV: 90.1 fL (ref 80.0–100.0)
MPV: 12.1 fL (ref 7.5–12.5)
Platelets: 219 10*3/uL (ref 140–400)
RBC: 4.46 10*6/uL (ref 3.80–5.10)
RDW: 13.3 % (ref 11.0–15.0)
WBC: 4.2 10*3/uL (ref 3.8–10.8)

## 2021-04-30 LAB — COMPREHENSIVE METABOLIC PANEL
AG Ratio: 1.7 (calc) (ref 1.0–2.5)
ALT: 14 U/L (ref 6–29)
AST: 20 U/L (ref 10–35)
Albumin: 4.2 g/dL (ref 3.6–5.1)
Alkaline phosphatase (APISO): 68 U/L (ref 37–153)
BUN: 17 mg/dL (ref 7–25)
CO2: 30 mmol/L (ref 20–32)
Calcium: 9.7 mg/dL (ref 8.6–10.4)
Chloride: 105 mmol/L (ref 98–110)
Creat: 0.87 mg/dL (ref 0.50–1.03)
Globulin: 2.5 g/dL (calc) (ref 1.9–3.7)
Glucose, Bld: 82 mg/dL (ref 65–99)
Potassium: 4.5 mmol/L (ref 3.5–5.3)
Sodium: 140 mmol/L (ref 135–146)
Total Bilirubin: 0.4 mg/dL (ref 0.2–1.2)
Total Protein: 6.7 g/dL (ref 6.1–8.1)

## 2021-04-30 LAB — HEMOGLOBIN A1C
Hgb A1c MFr Bld: 5.6 % of total Hgb (ref <5.7)
Mean Plasma Glucose: 114 mg/dL
eAG (mmol/L): 6.3 mmol/L

## 2021-04-30 LAB — LIPID PANEL
Cholesterol: 213 mg/dL — ABNORMAL HIGH (ref <200)
HDL: 68 mg/dL (ref >=50)
LDL Cholesterol (Calc): 129 mg/dL (calc) — ABNORMAL HIGH
Non-HDL Cholesterol (Calc): 145 mg/dL (calc) — ABNORMAL HIGH (ref <130)
Total CHOL/HDL Ratio: 3.1 (calc) (ref <5.0)
Triglycerides: 67 mg/dL (ref <150)

## 2021-04-30 LAB — TSH: TSH: 1.02 mIU/L

## 2021-04-30 MED ORDER — VALACYCLOVIR HCL 1 G PO TABS: ORAL_TABLET | ORAL | 2 refills | Status: DC

## 2021-04-30 NOTE — Patient Instructions (Signed)

## 2021-07-04 NOTE — Progress Notes (Signed)
?Subjective:  ? ? Sharon Mendoza - 51 y.o. female MRN [id number]  Date of birth: 1970/10/12 ? ?HPI ? ?Sharon Mendoza is to establish care.  ? ?Current issues and/or concerns: ?Reports intermittent right lower extremity pain persisting for 5 months. Located primarily at the right thigh. Radiates down right leg and into the right foot. Right medial foot with tenderness. 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 Reports she can see her veins. Notices fingernails and toenails on right side intermittently turn blue and cold. Denies red flag symptoms such as but not limited to swelling, warmth, redness, shortness of breath, and chest pain. No recent trauma or injury. Followed by Cardiology and Neurology routinely. Considered if sheath from aneurysm causing vascular complications. Also, reports her mother has history of vascular issues of extremities.  ? ?ROS per HPI  ? ? ? ?Health Maintenance:  ?Health Maintenance Due  ?Topic Date Due  ? HIV Screening  Never done  ? Hepatitis C Screening  Never done  ? Zoster Vaccines- Shingrix (1 of 2) Never done  ? COLONOSCOPY (Pts 45-60yr Insurance coverage will need to be confirmed)  03/22/2018  ? COVID-19 Vaccine (4 - Booster for Pfizer series) 05/30/2020  ? ? ? ?Past Medical History: ?Patient Active Problem List  ? Diagnosis Date Noted  ? Raynaud's disease 04/30/2021  ? Varicose veins of bilateral lower extremities with pain 04/30/2021  ? Family history of coronary artery disease 05/08/2020  ? Chronic pain 04/24/2020  ? Dyslipidemia 04/24/2020  ? Gastroesophageal reflux disease without esophagitis 04/24/2020  ? Pain in right knee 04/24/2020  ? History of cerebral aneurysm 04/17/2018  ? Genetic testing 06/08/2017  ? Family history of colon cancer   ? Family history of breast cancer   ? Essential hypertension 02/18/2015  ? Hyperlipidemia 02/18/2015  ? Chest pain 02/18/2015  ? Brain aneurysm 11/21/2014  ? Pain in joint, shoulder region 12/10/2013  ? LBP  radiating to left leg 12/10/2013  ? Aneurysm, cerebral, nonruptured 12/10/2013  ? Fibroid uterus 06/22/2012  ? Menorrhagia 06/22/2012  ? Dysmenorrhea 06/22/2012  ? ? ? ? ?Social History  ? reports that she has never smoked. She has never been exposed to tobacco smoke. She has never used smokeless tobacco. She reports that she does not drink alcohol and does not use drugs.  ? ?Family History  ?family history includes Aneurysm in her brother, brother, sister, and sister; Aneurysm (age of onset: 149 in her sister; Asthma in her son; Breast cancer in her cousin and paternal aunt; Breast cancer (age of onset: 652 in her paternal aunt and paternal aunt; Colon cancer (age of onset: 330 in her father; Colon polyps in her brother; Heart attack in her brother and sister; Heart disease in her brother; Hypertension in her mother; Liver disease in her mother; Stroke in her maternal grandmother; Throat cancer (age of onset: 762 in her father.  ? ?Medications: reviewed and updated ?  ?Objective:  ? Physical Exam ?BP 118/76 (BP Location: Left Arm, Patient Position: Sitting, Cuff Size: Normal)   Pulse 62   Temp 98.3 ?F (36.8 ?C)   Resp 18   Ht 5' 2.99" (1.6 m)   Wt 122 lb (55.3 kg)   LMP  (LMP Unknown)   SpO2 98%   BMI 21.62 kg/m?  ? ?Physical Exam ?HENT:  ?   Head: Normocephalic and atraumatic.  ?Eyes:  ?   Extraocular Movements: Extraocular movements intact.  ?   Conjunctiva/sclera: Conjunctivae normal.  ?  Pupils: Pupils are equal, round, and reactive to light.  ?Cardiovascular:  ?   Rate and Rhythm: Normal rate and regular rhythm.  ?   Pulses: Normal pulses.  ?   Heart sounds: Normal heart sounds.  ?Pulmonary:  ?   Effort: Pulmonary effort is normal.  ?   Breath sounds: Normal breath sounds.  ?Musculoskeletal:  ?   Cervical back: Normal range of motion and neck supple.  ?   Right lower leg: Normal.  ?   Right ankle: Normal.  ?   Right foot: Normal.  ?Skin: ?   General: Skin is warm and dry.  ?Neurological:  ?   General:  No focal deficit present.  ?   Mental Status: She is alert and oriented to person, place, and time.  ?Psychiatric:     ?   Mood and Affect: Mood normal.     ?   Behavior: Behavior normal.  ? ? ?Assessment & Plan:  ?1. Encounter to establish care: ?- Patient presents today to establish care.  ?- Return for annual physical examination, labs, and health maintenance. Arrive fasting meaning having no food for at least 8 hours prior to appointment. You may have only water or black coffee. Please take scheduled medications as normal. ? ?2. Pain of right lower extremity: ?3. Cold feet: ?4. Nail discoloration: ?- Referral to Vascular Surgery for further evaluation and management.  ?- Ambulatory referral to Vascular Surgery ? ? ? ? ?Patient was given clear instructions to go to Emergency Department or return to medical center if symptoms don't improve, worsen, or new problems develop.The patient verbalized understanding. ? ?I discussed the assessment and treatment plan with the patient. The patient was provided an opportunity to ask questions and all were answered. The patient agreed with the plan and demonstrated an understanding of the instructions. ?  ?The patient was advised to call back or seek an in-person evaluation if the symptoms worsen or if the condition fails to improve as anticipated. ? ? ? ?Durene Fruits, NP ?07/09/2021, 10:28 AM ?Primary Care at [facility]  ? ?

## 2021-07-09 ENCOUNTER — Ambulatory Visit: Payer: 59 | Admitting: Family

## 2021-07-09 ENCOUNTER — Encounter: Payer: Self-pay | Admitting: Family

## 2021-07-09 VITALS — BP 118/76 | HR 62 | Temp 98.3°F | Resp 18 | Ht 62.99 in | Wt 122.0 lb

## 2021-07-09 DIAGNOSIS — L608 Other nail disorders: Secondary | ICD-10-CM | POA: Diagnosis not present

## 2021-07-09 DIAGNOSIS — R209 Unspecified disturbances of skin sensation: Secondary | ICD-10-CM | POA: Diagnosis not present

## 2021-07-09 DIAGNOSIS — Z7689 Persons encountering health services in other specified circumstances: Secondary | ICD-10-CM | POA: Diagnosis not present

## 2021-07-09 DIAGNOSIS — M79604 Pain in right leg: Secondary | ICD-10-CM | POA: Diagnosis not present

## 2021-07-09 NOTE — Progress Notes (Signed)
Pt presents to establish care ?Pt has complaints of right leg pain that radiates to bottom of foot started about 5 months ago  ?

## 2021-07-27 ENCOUNTER — Encounter: Payer: Self-pay | Admitting: Family

## 2021-07-27 NOTE — Telephone Encounter (Signed)
Spoke w/Sonya at [facility] [phone] they do have pt referral and pt can call to get an appointment scheduled. Per Davy Pique they are back-up on scheduling  ?

## 2021-07-29 ENCOUNTER — Ambulatory Visit
Admission: EM | Admit: 2021-07-29 | Discharge: 2021-07-29 | Disposition: A | Discharge status: Home / Self Care | Payer: 59 | Attending: Physician Assistant | Admitting: Physician Assistant

## 2021-07-29 ENCOUNTER — Encounter: Payer: Self-pay | Admitting: Emergency Medicine

## 2021-07-29 ENCOUNTER — Other Ambulatory Visit: Payer: Self-pay

## 2021-07-29 DIAGNOSIS — L282 Other prurigo: Secondary | ICD-10-CM

## 2021-07-29 DIAGNOSIS — L239 Allergic contact dermatitis, unspecified cause: Secondary | ICD-10-CM | POA: Diagnosis not present

## 2021-07-29 MED ORDER — FAMOTIDINE 40 MG PO TABS: 40.0000 mg | ORAL_TABLET | Freq: Every day | ORAL | 0 refills | Status: DC

## 2021-07-29 MED ORDER — TRIAMCINOLONE ACETONIDE 0.5 % EX CREA: 1.0000 "application " | TOPICAL_CREAM | Freq: Two times a day (BID) | CUTANEOUS | 0 refills | Status: DC

## 2021-07-29 NOTE — Discharge Instructions (Signed)
Continue Zyrtec in the morning.  Add Pepcid at night.  Use triamcinolone cream twice daily as needed.  Use hypoallergenic soaps and detergents.  Use cool compresses for additional symptom relief.  If anything worsens and you have a spread of rash, fever, nausea, vomiting you need to be seen immediately. ?

## 2021-07-29 NOTE — ED Provider Notes (Signed)
?[facility] ? ? ? ?CSN: [id number] ?Arrival date & time: 07/29/21  1828 ? ? ?  ? ?History   ?Chief Complaint ?Chief Complaint  ?Patient presents with  ? Rash  ? ? ?HPI ?Sharon Mendoza is a 51 y.o. female.  ? ?Patient presents today with a 3-day history of pruritic rash involving her upper arms, elbows, ankles.  She denies any new exposures including to insects, plants, personal hygiene products such as soaps/detergents, medications, antibiotics.  She has had similar symptoms in the past that resolved without intervention but current symptoms continue to persist despite topical medication.  She denies history of dermatological condition including psoriasis or eczema.  She denies any difficulty breathing, swelling of her throat, shortness of breath, fever, nausea, vomiting.  She has tried Benadryl as well as topical steroids with minimal improvement of symptoms.  She does take Zyrtec daily for allergy symptoms. ? ? ?Past Medical History:  ?Diagnosis Date  ? Abnormal pap   ? Cerebral aneurysm without rupture   ? Chest pain radiating to arm   ? Family history of breast cancer   ? Family history of colon cancer   ? Family history of heart disease   ? Gastroesophageal reflux   ? Gestational diabetes   ? Hyperlipidemia   ? Hypertension   ? Mitral valve regurgitation   ? PONV (postoperative nausea and vomiting)   ? ? ?Patient Active Problem List  ? Diagnosis Date Noted  ? Raynaud's disease 04/30/2021  ? Varicose veins of bilateral lower extremities with pain 04/30/2021  ? Family history of coronary artery disease 05/08/2020  ? Chronic pain 04/24/2020  ? Dyslipidemia 04/24/2020  ? Gastroesophageal reflux disease without esophagitis 04/24/2020  ? Pain in right knee 04/24/2020  ? History of cerebral aneurysm 04/17/2018  ? Genetic testing 06/08/2017  ? Family history of colon cancer   ? Family history of breast cancer   ? Essential hypertension 02/18/2015  ? Hyperlipidemia 02/18/2015  ? Chest pain 02/18/2015  ?  Brain aneurysm 11/21/2014  ? Pain in joint, shoulder region 12/10/2013  ? LBP radiating to left leg 12/10/2013  ? Aneurysm, cerebral, nonruptured 12/10/2013  ? Fibroid uterus 06/22/2012  ? Menorrhagia 06/22/2012  ? Dysmenorrhea 06/22/2012  ? ? ?Past Surgical History:  ?Procedure Laterality Date  ? BREAST BIOPSY    ? age 49 left breast  ? CARDIOVASCULAR STRESS TEST  05/19/11  ? 05/19/11 Nuclear stress test ([facility]): Normal study, EF 87%  ? CESAREAN SECTION  09/2005  ? CT CORONARY CA SCORING  06/23/12  ? 06/23/12 Cardiac CTA ([facility]): Ca score 0. Right dominant. Normal coronaries.  ? INTRACRANIAL ANEURYSM REPAIR    ? MYOMECTOMY  2005  ? RADIOLOGY WITH ANESTHESIA N/A 11/21/2014  ? Procedure: Embolization;  Surgeon: Luanne Bras, MD;  Location: [facility];  Service: Radiology;  Laterality: N/A;  ? US ECHOCARDIOGRAPHY  05/18/11  ? 05/18/11 Echo ([facility]): NL chamber size, LV systolic function, wall motion. LVEF 77%. Mild LVH. Mild MR/TR. NL PAP.  ? ? ?OB History   ? ? Gravida  ?1  ? Para  ?1  ? Term  ?   ? Preterm  ?   ? AB  ?   ? Living  ?1  ?  ? ? SAB  ?   ? IAB  ?   ? Ectopic  ?   ? Multiple  ?   ? Live Births  ?   ?   ?  ?  ? ? ? ?  Home Medications   ? ?Prior to Admission medications   ?Medication Sig Start Date End Date Taking? Authorizing Provider  ?famotidine (PEPCID) 40 MG tablet Take 1 tablet (40 mg total) by mouth at bedtime. 07/29/21  Yes Evette Diclemente K, PA-C  ?triamcinolone cream (KENALOG) 0.5 % Apply 1 application. topically 2 (two) times daily. 07/29/21  Yes Eraina Winnie K, PA-C  ?aspirin 81 MG tablet Take 81 mg by mouth daily.    [provider]  ?atenolol (TENORMIN) 50 MG tablet Take 1 tablet (50 mg total) by mouth daily. 12/19/20 06/17/21  Lorretta Harp, MD  ?Cetirizine HCl 10 MG CAPS Take 1 capsule (10 mg total) by mouth daily. 06/24/19   Wieters, Hallie C, PA-C  ?clopidogrel (PLAVIX) 75 MG tablet Take 0.5 tablets (37.5 mg total) by mouth every other day. 03/10/21   Boisseau,  Angie Fava, PA  ?enalapril (VASOTEC) 5 MG tablet Take 1 tablet (5 mg total) by mouth daily. 12/19/20   Lorretta Harp, MD  ?rosuvastatin (CRESTOR) 10 MG tablet Take 1 tablet (10 mg total) by mouth daily. 06/30/20 09/28/20  Lorretta Harp, MD  ?valACYclovir (VALTREX) 1000 MG tablet Take 2 tablets po q 12 hours for 2 doses. 04/30/21   Salvadore Dom, MD  ?clopidogrel (PLAVIX) 75 MG tablet Take 0.5 tablets (37.5 mg total) by mouth every other day. 08/28/20   Theresa Duty, NP  ? ? ?Family History ?Family History  ?Problem Relation Age of Onset  ? Hypertension Mother   ? Liver disease Mother   ? Colon cancer Father 74  ? Throat cancer Father 77  ? Heart disease Brother   ? Aneurysm Brother   ? Heart attack Brother   ? Asthma Son   ? Aneurysm Sister   ? Aneurysm Brother   ? Aneurysm Sister   ? Heart attack Sister   ? Aneurysm Sister 55  ? Stroke Maternal Grandmother   ? Breast cancer Paternal Aunt 43  ?     metastatic  ? Breast cancer Paternal Aunt 1  ?     metastatic  ? Colon polyps Brother   ? Breast cancer Cousin   ? Breast cancer Paternal Aunt   ? ? ?Social History ?Social History  ? ?Tobacco Use  ? Smoking status: Never  ?  Passive exposure: Never  ? Smokeless tobacco: Never  ?Vaping Use  ? Vaping Use: Never used  ?Substance Use Topics  ? Alcohol use: No  ?  Alcohol/week: 0.0 standard drinks  ? Drug use: No  ? ? ? ?Allergies   ?Patient has no known allergies. ? ? ?Review of Systems ?Review of Systems  ?Constitutional:  Negative for activity change, appetite change, fatigue and fever.  ?HENT:  Negative for sore throat, trouble swallowing and voice change.   ?Gastrointestinal:  Negative for abdominal pain, diarrhea and nausea.  ?Skin:  Positive for rash.  ?Neurological:  Negative for dizziness, light-headedness and headaches.  ? ? ?Physical Exam ?Triage Vital Signs ?ED Triage Vitals [07/29/21 1916]  ?Enc Vitals Group  ?   BP 128/81  ?   Pulse Rate 77  ?   Resp 18  ?   Temp 97.8 ?F (36.6 ?C)  ?   Temp Source  Oral  ?   SpO2 97 %  ?   Weight   ?   Height   ?   Head Circumference   ?   Peak Flow   ?   Pain Score 0  ?  Pain Loc   ?   Pain Edu?   ?   Excl. in [place]?   ? ?No data found. ? ?Updated Vital Signs ?BP 128/81 (BP Location: Left Arm)   Pulse 77   Temp 97.8 ?F (36.6 ?C) (Oral)   Resp 18   LMP  (LMP Unknown)   SpO2 97%  ? ?Visual Acuity ?Right Eye Distance:   ?Left Eye Distance:   ?Bilateral Distance:   ? ?Right Eye Near:   ?Left Eye Near:    ?Bilateral Near:    ? ?Physical Exam ?Vitals reviewed.  ?Constitutional:   ?   General: She is awake. She is not in acute distress. ?   Appearance: Normal appearance. She is well-developed. She is not ill-appearing.  ?   Comments: Very pleasant female appears stated age no acute distress  ?HENT:  ?   Head: Normocephalic and atraumatic.  ?   Nose: Nose normal.  ?   Mouth/Throat:  ?   Pharynx: Uvula midline. No oropharyngeal exudate or posterior oropharyngeal erythema.  ?Cardiovascular:  ?   Rate and Rhythm: Normal rate and regular rhythm.  ?   Heart sounds: Normal heart sounds, S1 normal and S2 normal. No murmur heard. ?Pulmonary:  ?   Effort: Pulmonary effort is normal.  ?   Breath sounds: Normal breath sounds. No wheezing, rhonchi or rales.  ?   Comments: Clear to auscultation bilaterally ?Skin: ?   Findings: Rash present. Rash is macular and papular.  ?   Comments: Widespread maculopapular rash involving bilateral arms and ankles with evidence of excoriation.  No bleeding or drainage noted.  No streaking or evidence of lymphangitis.  ?Psychiatric:     ?   Behavior: Behavior is cooperative.  ? ? ? ?UC Treatments / Results  ?Labs ?(all labs ordered are listed, but only abnormal results are displayed) ?Labs Reviewed - No data to display ? ?EKG ? ? ?Radiology ?No results found. ? ?Procedures ?Procedures (including critical care time) ? ?Medications Ordered in UC ?Medications - No data to display ? ?Initial Impression / Assessment and Plan / UC Course  ?I have reviewed the triage  vital signs and the nursing notes. ? ?Pertinent labs & imaging results that were available during my care of the patient were reviewed by me and considered in my medical decision making (see chart for details).

## 2021-07-29 NOTE — ED Triage Notes (Signed)
Pt here for itchy rash to arms, legs and hands; pt sts hx of similar x 3 this year with unknown cause  ?

## 2021-08-14 NOTE — Progress Notes (Unsigned)
Patient ID: Sharon Mendoza, female    DOB: 1970-05-26  MRN: [id number]  CC: Annual Physical Exam  Subjective: Sharon Mendoza is a 51 y.o. female who presents for annual physical exam.   Her concerns today include:  Colon ca   Patient Active Problem List   Diagnosis Date Noted   Raynaud's disease 04/30/2021   Varicose veins of bilateral lower extremities with pain 04/30/2021   Family history of coronary artery disease 05/08/2020   Chronic pain 04/24/2020   Dyslipidemia 04/24/2020   Gastroesophageal reflux disease without esophagitis 04/24/2020   Pain in right knee 04/24/2020   History of cerebral aneurysm 04/17/2018   Genetic testing 06/08/2017   Family history of colon cancer    Family history of breast cancer    Essential hypertension 02/18/2015   Hyperlipidemia 02/18/2015   Chest pain 02/18/2015   Brain aneurysm 11/21/2014   Pain in joint, shoulder region 12/10/2013   LBP radiating to left leg 12/10/2013   Aneurysm, cerebral, nonruptured 12/10/2013   Fibroid uterus 06/22/2012   Menorrhagia 06/22/2012   Dysmenorrhea 06/22/2012     Current Outpatient Medications on File Prior to Visit  Medication Sig Dispense Refill   aspirin 81 MG tablet Take 81 mg by mouth daily.     atenolol (TENORMIN) 50 MG tablet Take 1 tablet (50 mg total) by mouth daily. 90 tablet 1   Cetirizine HCl 10 MG CAPS Take 1 capsule (10 mg total) by mouth daily. 15 capsule 0   clopidogrel (PLAVIX) 75 MG tablet Take 0.5 tablets (37.5 mg total) by mouth every other day. 15 tablet 2   enalapril (VASOTEC) 5 MG tablet Take 1 tablet (5 mg total) by mouth daily. 90 tablet 1   famotidine (PEPCID) 40 MG tablet Take 1 tablet (40 mg total) by mouth at bedtime. 30 tablet 0   rosuvastatin (CRESTOR) 10 MG tablet Take 1 tablet (10 mg total) by mouth daily. 90 tablet 3   triamcinolone cream (KENALOG) 0.5 % Apply 1 application. topically 2 (two) times daily. 30 g 0   valACYclovir (VALTREX) 1000 MG tablet Take 2  tablets po q 12 hours for 2 doses. 30 tablet 2   [DISCONTINUED] clopidogrel (PLAVIX) 75 MG tablet Take 0.5 tablets (37.5 mg total) by mouth every other day. 15 tablet 2   No current facility-administered medications on file prior to visit.    No Known Allergies  Social History   Socioeconomic History   Marital status: Single    Spouse name: Not on file   Number of children: 1   Years of education: BS   Highest education level: Not on file  Occupational History   Occupation: PHARMACIST    Employer: [employer]  Tobacco Use   Smoking status: Never    Passive exposure: Never   Smokeless tobacco: Never  Vaping Use   Vaping Use: Never used  Substance and Sexual Activity   Alcohol use: No    Alcohol/week: 0.0 standard drinks   Drug use: No   Sexual activity: Yes    Partners: Male  Other Topics Concern   Not on file  Social History Narrative   Not on file   Social Determinants of Health   Financial Resource Strain: Not on file  Food Insecurity: Not on file  Transportation Needs: Not on file  Physical Activity: Not on file  Stress: Not on file  Social Connections: Not on file  Intimate Partner Violence: Not on file    Family History  Problem  Relation Age of Onset   Hypertension Mother    Liver disease Mother    Colon cancer Father 2   Throat cancer Father 8   Heart disease Brother    Aneurysm Brother    Heart attack Brother    Asthma Son    Aneurysm Sister    Aneurysm Brother    Aneurysm Sister    Heart attack Sister    Aneurysm Sister 18   Stroke Maternal Grandmother    Breast cancer Paternal Aunt 68       metastatic   Breast cancer Paternal Aunt 57       metastatic   Colon polyps Brother    Breast cancer Cousin    Breast cancer Paternal Aunt     Past Surgical History:  Procedure Laterality Date   BREAST BIOPSY     age 58 left breast   CARDIOVASCULAR STRESS TEST  05/19/11   05/19/11 Nuclear stress test ([facility]): Normal study, EF 87%    CESAREAN SECTION  09/2005   CT CORONARY CA SCORING  06/23/12   06/23/12 Cardiac CTA ([facility]): Ca score 0. Right dominant. Normal coronaries.   INTRACRANIAL ANEURYSM REPAIR     MYOMECTOMY  2005   RADIOLOGY WITH ANESTHESIA N/A 11/21/2014   Procedure: Embolization;  Surgeon: Luanne Bras, MD;  Location: [facility];  Service: Radiology;  Laterality: N/A;   US ECHOCARDIOGRAPHY  05/18/11   05/18/11 Echo ([facility]): NL chamber size, LV systolic function, wall motion. LVEF 77%. Mild LVH. Mild MR/TR. NL PAP.    ROS: Review of Systems Negative except as stated above  PHYSICAL EXAM: LMP  (LMP Unknown)   Physical Exam  {female adult master:310786} {female adult master:310785}     Latest Ref Rng & Units 04/30/2021    9:42 AM 08/07/2020   12:00 AM 04/24/2020   10:00 AM  CMP  Glucose 65 - 99 mg/dL 82   74   74    BUN 7 - 25 mg/dL '17   14   14    '$ Creatinine 0.50 - 1.03 mg/dL 0.87   0.80   0.86    Sodium 135 - 146 mmol/L 140   141   139    Potassium 3.5 - 5.3 mmol/L 4.5   4.9   5.1    Chloride 98 - 110 mmol/L 105   102   102    CO2 20 - 32 mmol/L '30   27   26    '$ Calcium 8.6 - 10.4 mg/dL 9.7   10.2   9.9    Total Protein 6.1 - 8.1 g/dL 6.7   7.0   7.0    Total Bilirubin 0.2 - 1.2 mg/dL 0.4   0.3   0.4    Alkaline Phos 44 - 121 IU/L  97     AST 10 - 35 U/L '20   16   22    '$ ALT 6 - 29 U/L '14   7   10     '$ Lipid Panel     Component Value Date/Time   CHOL 213 (H) 04/30/2021 0942   CHOL 221 08/07/2020 0000   TRIG 67 04/30/2021 0942   HDL 68 04/30/2021 0942   HDL 75 08/07/2020 0000   CHOLHDL 3.1 04/30/2021 0942   VLDL 15 12/16/2015 0851   LDLCALC 129 (H) 04/30/2021 0942    CBC    Component Value Date/Time   WBC 4.2 04/30/2021 0942   RBC 4.46 04/30/2021 0942  HGB 12.9 04/30/2021 0942   HGB 13.5 04/18/2019 0938   HGB 12.7 11/28/2013 0810   HCT 40.2 04/30/2021 0942   HCT 41.5 04/18/2019 0938   PLT 219 04/30/2021 0942   PLT 224 04/18/2019 0938   MCV 90.1 04/30/2021 0942    MCV 90 04/18/2019 0938   MCH 28.9 04/30/2021 0942   MCHC 32.1 04/30/2021 0942   RDW 13.3 04/30/2021 0942   RDW 13.3 04/18/2019 0938   LYMPHSABS 2.4 11/22/2014 0450   MONOABS 0.8 11/22/2014 0450   EOSABS 0.0 11/22/2014 0450   BASOSABS 0.0 11/22/2014 0450    ASSESSMENT AND PLAN:  There are no diagnoses linked to this encounter.   Patient was given the opportunity to ask questions.  Patient verbalized understanding of the plan and was able to repeat key elements of the plan. Patient was given clear instructions to go to Emergency Department or return to medical center if symptoms don't improve, worsen, or new problems develop.The patient verbalized understanding.   No orders of the defined types were placed in this encounter.    Requested Prescriptions    No prescriptions requested or ordered in this encounter    No follow-ups on file.  Camillia Herter, NP

## 2021-08-20 ENCOUNTER — Ambulatory Visit (INDEPENDENT_AMBULATORY_CARE_PROVIDER_SITE_OTHER): Payer: 59

## 2021-08-20 ENCOUNTER — Ambulatory Visit (INDEPENDENT_AMBULATORY_CARE_PROVIDER_SITE_OTHER): Payer: 59 | Admitting: Family

## 2021-08-20 ENCOUNTER — Encounter: Payer: Self-pay | Admitting: Family

## 2021-08-20 VITALS — BP 119/75 | HR 70 | Temp 98.3°F | Resp 18 | Ht 62.99 in | Wt 122.0 lb

## 2021-08-20 DIAGNOSIS — Z0000 Encounter for general adult medical examination without abnormal findings: Secondary | ICD-10-CM

## 2021-08-20 DIAGNOSIS — M79642 Pain in left hand: Secondary | ICD-10-CM | POA: Diagnosis not present

## 2021-08-20 DIAGNOSIS — Z1211 Encounter for screening for malignant neoplasm of colon: Secondary | ICD-10-CM

## 2021-08-20 DIAGNOSIS — Z13228 Encounter for screening for other metabolic disorders: Secondary | ICD-10-CM | POA: Diagnosis not present

## 2021-08-20 DIAGNOSIS — R2232 Localized swelling, mass and lump, left upper limb: Secondary | ICD-10-CM

## 2021-08-20 DIAGNOSIS — Z114 Encounter for screening for human immunodeficiency virus [HIV]: Secondary | ICD-10-CM

## 2021-08-20 DIAGNOSIS — Z1159 Encounter for screening for other viral diseases: Secondary | ICD-10-CM

## 2021-08-20 DIAGNOSIS — Z800 Family history of malignant neoplasm of digestive organs: Secondary | ICD-10-CM | POA: Diagnosis not present

## 2021-08-20 NOTE — Patient Instructions (Signed)
Preventive Care 40-53 Years Old, Female Preventive care refers to lifestyle choices and visits with your health care provider that can promote health and wellness. Preventive care visits are also called wellness exams. What can I expect for my preventive care visit? Counseling Your health care provider may ask you questions about your: Medical history, including: Past medical problems. Family medical history. Pregnancy history. Current health, including: Menstrual cycle. Method of birth control. Emotional well-being. Home life and relationship well-being. Sexual activity and sexual health. Lifestyle, including: Alcohol, nicotine or tobacco, and drug use. Access to firearms. Diet, exercise, and sleep habits. Work and work environment. Sunscreen use. Safety issues such as seatbelt and bike helmet use. Physical exam Your health care provider will check your: Height and weight. These may be used to calculate your BMI (body mass index). BMI is a measurement that tells if you are at a healthy weight. Waist circumference. This measures the distance around your waistline. This measurement also tells if you are at a healthy weight and may help predict your risk of certain diseases, such as type 2 diabetes and high blood pressure. Heart rate and blood pressure. Body temperature. Skin for abnormal spots. What immunizations do I need?  Vaccines are usually given at various ages, according to a schedule. Your health care provider will recommend vaccines for you based on your age, medical history, and lifestyle or other factors, such as travel or where you work. What tests do I need? Screening Your health care provider may recommend screening tests for certain conditions. This may include: Lipid and cholesterol levels. Diabetes screening. This is done by checking your blood sugar (glucose) after you have not eaten for a while (fasting). Pelvic exam and Pap test. Hepatitis B test. Hepatitis C  test. HIV (human immunodeficiency virus) test. STI (sexually transmitted infection) testing, if you are at risk. Lung cancer screening. Colorectal cancer screening. Mammogram. Talk with your health care provider about when you should start having regular mammograms. This may depend on whether you have a family history of breast cancer. BRCA-related cancer screening. This may be done if you have a family history of breast, ovarian, tubal, or peritoneal cancers. Bone density scan. This is done to screen for osteoporosis. Talk with your health care provider about your test results, treatment options, and if necessary, the need for more tests. Follow these instructions at home: Eating and drinking  Eat a diet that includes fresh fruits and vegetables, whole grains, lean protein, and low-fat dairy products. Take vitamin and mineral supplements as recommended by your health care provider. Do not drink alcohol if: Your health care provider tells you not to drink. You are pregnant, may be pregnant, or are planning to become pregnant. If you drink alcohol: Limit how much you have to 0-1 drink a day. Know how much alcohol is in your drink. In the [place] one drink equals one 12 oz bottle of beer (355 mL), one 5 oz glass of wine (148 mL), or one 1 oz glass of hard liquor (44 mL). Lifestyle Brush your teeth every morning and night with fluoride toothpaste. Floss one time each day. Exercise for at least 30 minutes 5 or more days each week. Do not use any products that contain nicotine or tobacco. These products include cigarettes, chewing tobacco, and vaping devices, such as e-cigarettes. If you need help quitting, ask your health care provider. Do not use drugs. If you are sexually active, practice safe sex. Use a condom or other form of protection to   prevent STIs. If you do not wish to become pregnant, use a form of birth control. If you plan to become pregnant, see your health care provider for a  prepregnancy visit. Take aspirin only as told by your health care provider. Make sure that you understand how much to take and what form to take. Work with your health care provider to find out whether it is safe and beneficial for you to take aspirin daily. Find healthy ways to manage stress, such as: Meditation, yoga, or listening to music. Journaling. Talking to a trusted person. Spending time with friends and family. Minimize exposure to UV radiation to reduce your risk of skin cancer. Safety Always wear your seat belt while driving or riding in a vehicle. Do not drive: If you have been drinking alcohol. Do not ride with someone who has been drinking. When you are tired or distracted. While texting. If you have been using any mind-altering substances or drugs. Wear a helmet and other protective equipment during sports activities. If you have firearms in your house, make sure you follow all gun safety procedures. Seek help if you have been physically or sexually abused. What's next? Visit your health care provider once a year for an annual wellness visit. Ask your health care provider how often you should have your eyes and teeth checked. Stay up to date on all vaccines. This information is not intended to replace advice given to you by your health care provider. Make sure you discuss any questions you have with your health care provider. Document Revised: 09/03/2020 Document Reviewed: 09/03/2020 Elsevier Patient Education  [place].

## 2021-08-20 NOTE — Progress Notes (Signed)
Pt presents for annual physical exam, pt states has colonoscopy consultation for 5 year f/u scheduled for 7/26  Pt has cyst on left hand that has hardened within last couple of months, denies constant pain, only feels when gripping something

## 2021-08-21 LAB — HEPATITIS C ANTIBODY: Hep C Virus Ab: NONREACTIVE

## 2021-08-21 LAB — HIV ANTIBODY (ROUTINE TESTING W REFLEX): HIV Screen 4th Generation wRfx: NONREACTIVE

## 2021-08-21 NOTE — Progress Notes (Signed)
-   Hepatitis C negative. - HIV negative.

## 2021-08-21 NOTE — Progress Notes (Signed)
Arthritis left hand. Continue with plan discussed in office.

## 2021-08-25 ENCOUNTER — Other Ambulatory Visit: Payer: Self-pay | Admitting: *Deleted

## 2021-08-25 DIAGNOSIS — R209 Unspecified disturbances of skin sensation: Secondary | ICD-10-CM

## 2021-08-27 ENCOUNTER — Ambulatory Visit: Payer: 59 | Admitting: Orthopedic Surgery

## 2021-08-27 ENCOUNTER — Encounter: Payer: Self-pay | Admitting: Orthopedic Surgery

## 2021-08-27 DIAGNOSIS — M67442 Ganglion, left hand: Secondary | ICD-10-CM

## 2021-08-27 NOTE — Progress Notes (Signed)
Office Visit Note   Patient: Sharon Mendoza           Date of Birth: 1970-04-27           MRN: [id number] Visit Date: 08/27/2021              Requested by: Camillia Herter, NP [place],  [place] [address] PCP: Camillia Herter, NP   Assessment & Plan: Visit Diagnoses:  1. Ganglion cyst of flexor tendon sheath of finger, left     Plan: Discussed with patient that the mass over the A1 pulley of the left ring finger seems most consistent with a retinacular cyst.  It is quite small and less than 1 cm.  We discussed treatment options including observation, attempted aspiration or disruption of the cyst wall in the office, or surgical excision.  After discussion, she would like to continue to observe it for now.  She can come back and see me as needed.  Follow-Up Instructions: No follow-ups on file.   Orders:  No orders of the defined types were placed in this encounter.  No orders of the defined types were placed in this encounter.     Procedures: No procedures performed   Clinical Data: No additional findings.   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  The mass was first noticed 1 or 2 months ago but she notes that she had some mild discomfort in this finger maybe a year or so ago.  Initially when the mass was first noticed it was much more sensitive and painful.  Since that time it has become much less so.  She has no pain today.  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  The example she gives is when opening a pill bottle.  The mass is very small, less than 1 cm, and has not changed in size since it was first noticed.  She denies any locking, catching of this finger.  She denies any lumps or bumps elsewhere in her hands.    Review  of Systems   Objective: Vital Signs: BP 121/78 (BP Location: Left Arm, Patient Position: Sitting)   LMP  (LMP Unknown)   Physical Exam Constitutional:      Appearance: Normal appearance.  Cardiovascular:     Rate and Rhythm: Normal rate.     Pulses: Normal pulses.  Pulmonary:     Effort: Pulmonary effort is normal.  Skin:    General: Skin is warm and dry.     Capillary Refill: Capillary refill takes less than 2 seconds.  Neurological:     Mental Status: She is alert.     Left Hand Exam   Tenderness  Left hand tenderness location: Minimal TTP at base of ring finger over A1 pul.ley.   Other  Erythema: absent Sensation: normal Pulse: present  Comments:  Small, round, firm, <1 cm mass at base of ring finger over A1 pulley.  No catching or triggering of finger.       Specialty Comments:  No specialty comments available.  Imaging: No results found.   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, left 08/27/2021   Raynaud's disease 04/30/2021  Varicose veins of bilateral lower extremities with pain 04/30/2021   Family history of coronary artery disease 05/08/2020   Chronic pain 04/24/2020   Dyslipidemia 04/24/2020   Gastroesophageal reflux disease without esophagitis 04/24/2020   Pain in right knee 04/24/2020   History of cerebral aneurysm 04/17/2018   Genetic testing 06/08/2017   Family history of colon cancer    Family history of breast cancer    Essential hypertension 02/18/2015   Hyperlipidemia 02/18/2015   Chest pain 02/18/2015   Brain aneurysm 11/21/2014   Pain in joint, shoulder region 12/10/2013   LBP radiating to left leg 12/10/2013   Aneurysm, cerebral, nonruptured 12/10/2013   Fibroid uterus 06/22/2012   Menorrhagia 06/22/2012   Dysmenorrhea 06/22/2012   Past Medical History:  Diagnosis Date   Abnormal pap    Cerebral aneurysm without rupture    Chest pain radiating  to arm    Family history of breast cancer    Family history of colon cancer    Family history of heart disease    Gastroesophageal reflux    Gestational diabetes    Hyperlipidemia    Hypertension    Mitral valve regurgitation    PONV (postoperative nausea and vomiting)     Family History  Problem Relation Age of Onset   Hypertension Mother    Liver disease Mother    Colon cancer Father 28   Throat cancer Father 53   Heart disease Brother    Aneurysm Brother    Heart attack Brother    Asthma Son    Aneurysm Sister    Aneurysm Brother    Aneurysm Sister    Heart attack Sister    Aneurysm Sister 18   Stroke Maternal Grandmother    Breast cancer Paternal Aunt 54       metastatic   Breast cancer Paternal Aunt 96       metastatic   Colon polyps Brother    Breast cancer Cousin    Breast cancer Paternal Aunt     Past Surgical History:  Procedure Laterality Date   BREAST BIOPSY     age 26 left breast   CARDIOVASCULAR STRESS TEST  05/19/11   05/19/11 Nuclear stress test ([facility]): Normal study, EF 87%   CESAREAN SECTION  09/2005   CT CORONARY CA SCORING  06/23/12   06/23/12 Cardiac CTA ([facility]): Ca score 0. Right dominant. Normal coronaries.   INTRACRANIAL ANEURYSM REPAIR     MYOMECTOMY  2005   RADIOLOGY WITH ANESTHESIA N/A 11/21/2014   Procedure: Embolization;  Surgeon: Luanne Bras, MD;  Location: [facility];  Service: Radiology;  Laterality: N/A;   US ECHOCARDIOGRAPHY  05/18/11   05/18/11 Echo ([facility]): NL chamber size, LV systolic function, wall motion. LVEF 77%. Mild LVH. Mild MR/TR. NL PAP.   Social History   Occupational History   Occupation: PHARMACIST    Employer: [employer]  Tobacco Use   Smoking status: Never    Passive exposure: Never   Smokeless tobacco: Never  Vaping Use   Vaping Use: Never used  Substance and Sexual Activity   Alcohol use: No    Alcohol/week: 0.0 standard drinks of alcohol   Drug use: No   Sexual activity:  Yes    Partners: Male

## 2021-09-03 ENCOUNTER — Encounter: Payer: Self-pay | Admitting: Vascular Surgery

## 2021-09-03 ENCOUNTER — Ambulatory Visit (HOSPITAL_COMMUNITY)
Admission: RE | Admit: 2021-09-03 | Discharge: 2021-09-03 | Disposition: A | Discharge status: Home / Self Care | Payer: 59 | Source: Ambulatory Visit | Attending: Vascular Surgery | Admitting: Vascular Surgery

## 2021-09-03 ENCOUNTER — Ambulatory Visit: Payer: 59 | Admitting: Vascular Surgery

## 2021-09-03 VITALS — BP 109/70 | HR 68 | Temp 98.4°F | Resp 20 | Ht 63.0 in | Wt 123.0 lb

## 2021-09-03 DIAGNOSIS — I739 Peripheral vascular disease, unspecified: Secondary | ICD-10-CM | POA: Diagnosis not present

## 2021-09-03 DIAGNOSIS — R209 Unspecified disturbances of skin sensation: Secondary | ICD-10-CM

## 2021-09-03 NOTE — Progress Notes (Signed)
ASSESSMENT & PLAN   LEG PAIN: This patient has intermittent sharp pain in the lateral aspect of her right thigh.  I reassured her that based on her noninvasive studies and physical exam she has no evidence of significant arterial insufficiency that would explain her symptoms.  Given the location of the pain certainly it could be neurogenic related to back issues however she has no significant back pain.  I think if the symptoms worsen she could potentially require neurologic evaluation.  Likewise I did not think her pain could be attributed to significant venous disease.  She does not have any evidence on physical exam to have significant venous disease.  She has some mild venous disease with telangiectasias only.  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  With respect to her foot pain if this persist I would recommend evaluation by podiatry as she could Hunte have underlying issue such as planter fasciitis.  I be happy to see her back at any time if any new vascular issues arise  REASON FOR CONSULT:    Right lower extremity pain and cold feet.  The consult is requested by Durene Fruits, NP.  HPI:   Sharon Mendoza is a 51 y.o. female who was referred with right leg pain.  I have reviewed the records from the referring office.  The patient was seen on 08/20/2021 for an annual physical exam.  The patient was having pain in the lateral right thigh and also pain in the right foot.  To lesser degree she had symptoms on the left side.  She was sent for vascular consultation.  She does describe some pain in her calves which is brought on by ambulation and relieved with rest.  This usually takes some time before the symptoms go away.  I do not get any history of rest pain.  She has no previous history of DVT.  She does have a history of a  cerebral stent for an aneurysm and is on aspirin and Plavix for this.  She is also on a statin.  On my history she describes a sharp pain that occurs intermittently on the lateral aspect of her right thigh.  There do not appear to be any specific aggravating or alleviating factors.  She denies any significant low back pain.  The pain extends down into her foot at times and she has fairly constant pain in both feet especially on the right side.  She is unaware of any injury.  Risk factors for peripheral arterial disease include hypertension, and a family history of premature cardiovascular disease.  She denies any history of diabetes, hypercholesterolemia, or tobacco use.  Past Medical History:  Diagnosis Date   Abnormal pap    Cerebral aneurysm without rupture    Chest pain radiating to arm    Family history of breast cancer    Family history of colon cancer    Family history of heart disease    Gastroesophageal reflux    Gestational diabetes    Hyperlipidemia    Hypertension    Mitral valve regurgitation    PONV (postoperative nausea and vomiting)     Family History  Problem Relation Age of Onset   Hypertension Mother    Liver disease Mother    Colon cancer Father 59   Throat cancer Father 59  Heart disease Brother    Aneurysm Brother    Heart attack Brother    Asthma Son    Aneurysm Sister    Aneurysm Brother    Aneurysm Sister    Heart attack Sister    Aneurysm Sister 18   Stroke Maternal Grandmother    Breast cancer Paternal Aunt 45       metastatic   Breast cancer Paternal Aunt 63       metastatic   Colon polyps Brother    Breast cancer Cousin    Breast cancer Paternal Aunt     SOCIAL HISTORY: Social History   Tobacco Use   Smoking status: Never    Passive exposure: Never   Smokeless tobacco: Never  Substance Use Topics   Alcohol use: No    Alcohol/week: 0.0 standard drinks of alcohol    Allergies  Allergen Reactions   Atorvastatin Hives and Itching     Current Outpatient Medications  Medication Sig Dispense Refill   aspirin 81 MG tablet Take 81 mg by mouth daily.     Cetirizine HCl 10 MG CAPS Take 1 capsule (10 mg total) by mouth daily. 15 capsule 0   clopidogrel (PLAVIX) 75 MG tablet Take 0.5 tablets (37.5 mg total) by mouth every other day. 15 tablet 2   enalapril (VASOTEC) 5 MG tablet Take 1 tablet (5 mg total) by mouth daily. 90 tablet 1   famotidine (PEPCID) 40 MG tablet Take 1 tablet (40 mg total) by mouth at bedtime. 30 tablet 0   triamcinolone cream (KENALOG) 0.5 % Apply 1 application. topically 2 (two) times daily. 30 g 0   valACYclovir (VALTREX) 1000 MG tablet Take 2 tablets po q 12 hours for 2 doses. 30 tablet 2   atenolol (TENORMIN) 50 MG tablet Take 1 tablet (50 mg total) by mouth daily. 90 tablet 1   rosuvastatin (CRESTOR) 10 MG tablet Take 1 tablet (10 mg total) by mouth daily. 90 tablet 3   No current facility-administered medications for this visit.    REVIEW OF SYSTEMS:  '[X]'$  denotes positive finding, '[ ]'$  denotes negative finding Cardiac  Comments:  Chest pain or chest pressure:    Shortness of breath upon exertion:    Short of breath when lying flat:    Irregular heart rhythm:        Vascular    Pain in calf, thigh, or hip brought on by ambulation: x   Pain in feet at night that wakes you up from your sleep:  x   Blood clot in your veins:    Leg swelling:         Pulmonary    Oxygen at home:    Productive cough:     Wheezing:         Neurologic    Sudden weakness in arms or legs:     Sudden numbness in arms or legs:     Sudden onset of difficulty speaking or slurred speech:    Temporary loss of vision in one eye:     Problems with dizziness:         Gastrointestinal    Blood in stool:     Vomited blood:         Genitourinary    Burning when urinating:     Blood in urine:        Psychiatric    Major depression:         Hematologic    Bleeding problems:  Problems with blood clotting too  easily:        Skin    Rashes or ulcers: x       Constitutional    Fever or chills:    -  PHYSICAL EXAM:   Vitals:   09/03/21 1120  BP: 109/70  Pulse: 68  Resp: 20  Temp: 98.4 F (36.9 C)  SpO2: 97%  Weight: 123 lb (55.8 kg)  Height: '5\' 3"'$  (1.6 m)   Body mass index is 21.79 kg/m.  GENERAL: The patient is a well-nourished female, in no acute distress. The vital signs are documented above. CARDIAC: There is a regular rate and rhythm.  VASCULAR: I do not detect carotid bruits. She has palpable femoral pulses popliteal pulses and posterior tibial pulses bilaterally. She has a biphasic dorsalis pedis and posterior tibial signal with the Doppler bilaterally. She has no significant lower extremity swelling. She has some small telangiectasias bilaterally. PULMONARY: There is good air exchange bilaterally without wheezing or rales. ABDOMEN: Soft and non-tender with normal pitched bowel sounds.  I do not palpate any aneurysm. MUSCULOSKELETAL: There are no major deformities. NEUROLOGIC: No focal weakness or paresthesias are detected. SKIN: There are no ulcers or rashes noted. PSYCHIATRIC: The patient has a normal affect.  DATA:    ARTERIAL DOPPLER STUDY: I have independently interpreted her arterial Doppler study today.  On the right side there is a triphasic dorsalis pedis and posterior tibial signal.  ABI is 100%.  Toe pressure is 94 mmHg.  On the left side there is a biphasic dorsalis pedis and posterior tibial signal.  ABI is 100%.  Toe pressures 105 mmHg.  Deitra Mayo [facility]

## 2021-09-25 ENCOUNTER — Other Ambulatory Visit: Payer: Self-pay | Admitting: Family

## 2021-09-25 NOTE — Telephone Encounter (Signed)
Requested medication (s) are due for refill today: yes  Requested medication (s) are on the active medication list: yes    Last refill:    Vasotec  12/19/20 #90  1 refill        Clopidogrel   03/10/21  #15  2 refills  Future visit scheduled No  Notes to clinic:Historical providers, please review. Thank you.  Requested Prescriptions  Pending Prescriptions Disp Refills   enalapril (VASOTEC) 5 MG tablet 90 tablet 1    Sig: Take 1 tablet (5 mg total) by mouth daily.     Cardiovascular:  ACE Inhibitors Passed - 09/25/2021 12:19 PM      Passed - Cr in normal range and within 180 days    Creat  Date Value Ref Range Status  04/30/2021 0.87 0.50 - 1.03 mg/dL Final         Passed - K in normal range and within 180 days    Potassium  Date Value Ref Range Status  04/30/2021 4.5 3.5 - 5.3 mmol/L Final         Passed - Patient is not pregnant      Passed - Last BP in normal range    BP Readings from Last 1 Encounters:  09/03/21 109/70         Passed - Valid encounter within last 6 months    Recent Outpatient Visits           1 month ago Annual physical exam   [facility] at [facility], [place], NP   2 months ago Encounter to establish care   Primary Care at [facility], [place], NP   6 years ago Cystitis   Primary Care at [facility], Benjaman Pott, PA-C   7 years ago Acute bronchitis, unspecified organism   [facility] at [facility], Synetta Shadow, MD   7 years ago Cough   Primary Care at [place], MD               clopidogrel (PLAVIX) 75 MG tablet 15 tablet 2    Sig: Take 0.5 tablets (37.5 mg total) by mouth every other day.     Hematology: Antiplatelets - clopidogrel Passed - 09/25/2021 12:19 PM      Passed - HCT in normal range and within 180 days    HCT  Date Value Ref Range Status  04/30/2021 40.2 35.0 - 45.0 % Final   Hematocrit  Date Value Ref Range Status  04/18/2019 41.5 34.0 - 46.6 % Final         Passed - HGB in normal  range and within 180 days    Hemoglobin  Date Value Ref Range Status  04/30/2021 12.9 11.7 - 15.5 g/dL Final  04/18/2019 13.5 11.1 - 15.9 g/dL Final   Hemoglobin, fingerstick  Date Value Ref Range Status  11/28/2013 12.7 12.0 - 16.0 g/dL Final         Passed - PLT in normal range and within 180 days    Platelets  Date Value Ref Range Status  04/30/2021 219 140 - 400 Thousand/uL Final  04/18/2019 224 150 - 450 x10E3/uL Final   Platelet Count, POC  Date Value Ref Range Status  01/01/2013 223 142 - 424 K/uL Final         Passed - Cr in normal range and within 360 days    Creat  Date Value Ref Range Status  04/30/2021 0.87 0.50 - 1.03 mg/dL Final  Passed - Valid encounter within last 6 months    Recent Outpatient Visits           1 month ago Annual physical exam   Primary Care at [facility], [place], NP   2 months ago Encounter to establish care   Primary Care at [facility], [place], NP   6 years ago Cystitis   [facility] at [place] [facility], PA-C   7 years ago Acute bronchitis, unspecified organism   [facility] at [facility], MD   7 years ago Cough   Primary Care at [facility], Gay Filler, MD

## 2021-09-25 NOTE — Telephone Encounter (Signed)
Copied from [place] [id number]. Topic: General - Other >> Sep 25, 2021 10:16 AM Everette C wrote: Reason for CRM: Medication Refill - Medication: atenolol (TENORMIN) 50 MG tablet [[id number]]    enalapril (VASOTEC) 5 MG tablet [[id number]]   clopidogrel (PLAVIX) 75 MG tablet [[id number]]   Has the patient contacted their pharmacy? Yes.  The patient has been directed to contact their PCP  (Agent: If no, request that the patient contact the pharmacy for the refill. If patient does not wish to contact the pharmacy document the reason why and proceed with request.) (Agent: If yes, when and what did the pharmacy advise?)  Preferred Pharmacy (with phone number or street name): [facility] [id number], [place] [address] [place] [address] Phone: [phone] Fax: [phone] Hours: Not open 24 hours   Has the patient been seen for an appointment in the last year OR does the patient have an upcoming appointment? Yes.    Agent: Please be advised that RX refills may take up to 3 business days. We ask that you follow-up with your pharmacy.

## 2021-09-28 ENCOUNTER — Other Ambulatory Visit: Payer: Self-pay | Admitting: Cardiovascular Disease

## 2021-09-28 ENCOUNTER — Ambulatory Visit: Payer: Self-pay | Admitting: *Deleted

## 2021-09-28 MED ORDER — ATENOLOL 50 MG PO TABS: 50.0000 mg | ORAL_TABLET | Freq: Every day | ORAL | 0 refills | Status: DC

## 2021-09-28 NOTE — Telephone Encounter (Signed)
  Chief Complaint: lump  after a fall requesting appt  Symptoms: fell 8 days ago and noted lump between buttocks "crack" .  Less than size of quarter. Bruising color purplish but healing. Less painful no itching now. Area not healing fast  Frequency: 8 days ago  Pertinent Negatives: Patient denies fever, no bleeding or drainage from lump.  Disposition: '[]'$ ED /'[]'$ Urgent Care (no appt availability in office) / '[x]'$ Appointment(In office/virtual)/ '[]'$  [facility]/ '[]'$ Home Care/ '[]'$ Refused Recommended Disposition /'[]'$ [facility]/ '[]'$  Follow-up with PCP Additional Notes:   Please advise if patient can be seen earlier.    Reason for Disposition  [1] Small swelling or lump AND [2] unexplained AND [3] present > 1 week    See PCP within 2 weeks  Answer Assessment - Initial Assessment Questions 1. APPEARANCE of SWELLING: "What does it look like?" (e.g., lymph node, insect bite, mole)      8 days ago fell on to buttocks and in "crack" of buttocks area had swelling and a lump continues to be noticed 2. SIZE: "How large is the swelling?" (e.g., inches, cm; or compare to size of pinhead, tip of pen, eraser, coin, pea, grape, ping pong ball)      Less than size of quarter 3. LOCATION: "Where is the swelling located?"     "Crack" of between buttocks  4. ONSET: "When did the swelling start?"     After the fall 8 days ago  5. PAIN: "Is it painful?" If Yes, ask: "How much?"     Less painful now  6. ITCH: "Does it itch?" If Yes, ask: "How much?"     Was itching but not now  7. CAUSE: "What do you think caused the swelling?"     Noted after a fall  8. OTHER SYMPTOMS: "Do you have any other symptoms?" (e.g., fever)     No but noticed color to be purplish bruised color  Protocols used: Skin Lump or Localized Swelling-A-AH

## 2021-09-28 NOTE — Telephone Encounter (Signed)
Rx(s) sent to pharmacy electronically.  

## 2021-10-01 ENCOUNTER — Ambulatory Visit: Payer: 59 | Admitting: Family

## 2021-10-01 VITALS — BP 128/80 | HR 59 | Temp 98.3°F | Resp 16 | Wt 124.0 lb

## 2021-10-01 DIAGNOSIS — R229 Localized swelling, mass and lump, unspecified: Secondary | ICD-10-CM

## 2021-10-01 DIAGNOSIS — I10 Essential (primary) hypertension: Secondary | ICD-10-CM | POA: Diagnosis not present

## 2021-10-01 MED ORDER — ATENOLOL 50 MG PO TABS: 50.0000 mg | ORAL_TABLET | Freq: Every day | ORAL | 3 refills | Status: DC

## 2021-10-01 MED ORDER — ENALAPRIL MALEATE 5 MG PO TABS: 5.0000 mg | ORAL_TABLET | Freq: Every day | ORAL | 3 refills | Status: DC

## 2021-10-01 NOTE — Progress Notes (Signed)
Patient ID: Sharon Mendoza, female    DOB: Apr 23, 1970  MRN: [id number]  CC: Knot  Subjective: Sharon Mendoza is a 51 y.o. female who presents for knot.  Her concerns today include:  - Two weeks ago while walking down the steps in her home she slipped and fell . Only wearing socks at the time. Fell onto her buttocks. She used her arms to support herself from falling completely onto her back. She did not hit her head. Immediately noticed a knot at right lower back and left inner thigh. For pain she took over-the-counter medications. Symptoms eventually improved and she feels back to normal except for area of concern at left inner thigh. Reports the area is uncomfortable. Area has decreased in size since began. No further issues/concerns.   - Reports was followed by Cardiology for management of hypertension. States the cardiologist told her since her blood pressure is well-controlled she can be managed in the primary care setting. Her regimen includes Atenolol and Enalapril. She is doing well on the regimen. Home blood pressures normal. She is concerned about low pulse however this is her baseline. Reports she doesn't prefer to take medications and would like to be eventually discontinued.  - History of brain aneurysm x 3. Established with Neurology who also manages Plavix refills. She is aware to keep all scheduled appointments.   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 04/30/2021   Family history of coronary artery disease 05/08/2020   Dyslipidemia 04/24/2020   Gastroesophageal reflux disease without esophagitis 04/24/2020   Pain in right knee 04/24/2020   History of cerebral aneurysm 04/17/2018   Genetic testing 06/08/2017   Family history of colon cancer    Family history of breast cancer    Essential hypertension 02/18/2015   Hyperlipidemia  02/18/2015   Chest pain 02/18/2015   Brain aneurysm 11/21/2014   Pain in joint, shoulder region 12/10/2013   LBP radiating to left leg 12/10/2013   Aneurysm, cerebral, nonruptured 12/10/2013   Fibroid uterus 06/22/2012   Menorrhagia 06/22/2012   Dysmenorrhea 06/22/2012     Current Outpatient Medications on File Prior to Visit  Medication Sig Dispense Refill   aspirin 81 MG tablet Take 81 mg by mouth daily.     Cetirizine HCl 10 MG CAPS Take 1 capsule (10 mg total) by mouth daily. 15 capsule 0   clopidogrel (PLAVIX) 75 MG tablet Take 0.5 tablets (37.5 mg total) by mouth every other day. 15 tablet 2   famotidine (PEPCID) 40 MG tablet Take 1 tablet (40 mg total) by mouth at bedtime. 30 tablet 0   rosuvastatin (CRESTOR) 10 MG tablet Take 1 tablet (10 mg total) by mouth daily. 90 tablet 3   triamcinolone cream (KENALOG) 0.5 % Apply 1 application. topically 2 (two) times daily. 30 g 0   valACYclovir (VALTREX) 1000 MG tablet Take 2 tablets po q 12 hours for 2 doses. 30 tablet 2   [DISCONTINUED] clopidogrel (PLAVIX) 75 MG tablet Take 0.5 tablets (37.5 mg total) by mouth every other day. 15 tablet 2   No current facility-administered medications on file prior to visit.    Allergies  Allergen Reactions   Atorvastatin Hives and Itching    Social History   Socioeconomic History   Marital status: Single    Spouse name: Not on file   Number of children: 1  Years of education: BS   Highest education level: Not on file  Occupational History   Occupation: PHARMACIST    Employer: [employer]  Tobacco Use   Smoking status: Never    Passive exposure: Never   Smokeless tobacco: Never  Vaping Use   Vaping Use: Never used  Substance and Sexual Activity   Alcohol use: No    Alcohol/week: 0.0 standard drinks of alcohol   Drug use: No   Sexual activity: Yes    Partners: Male  Other Topics Concern   Not on file  Social History Narrative   Not on file   Social Determinants of Health    Financial Resource Strain: Not on file  Food Insecurity: Not on file  Transportation Needs: Not on file  Physical Activity: Not on file  Stress: Not on file  Social Connections: Not on file  Intimate Partner Violence: Not on file    Family History  Problem Relation Age of Onset   Hypertension Mother    Liver disease Mother    Colon cancer Father 41   Throat cancer Father 42   Heart disease Brother    Aneurysm Brother    Heart attack Brother    Asthma Son    Aneurysm Sister    Aneurysm Brother    Aneurysm Sister    Heart attack Sister    Aneurysm Sister 18   Stroke Maternal Grandmother    Breast cancer Paternal Aunt 36       metastatic   Breast cancer Paternal Aunt 48       metastatic   Colon polyps Brother    Breast cancer Cousin    Breast cancer Paternal Aunt     Past Surgical History:  Procedure Laterality Date   BREAST BIOPSY     age 52 left breast   CARDIOVASCULAR STRESS TEST  05/19/11   05/19/11 Nuclear stress test ([facility]): Normal study, EF 87%   CESAREAN SECTION  09/2005   CT CORONARY CA SCORING  06/23/12   06/23/12 Cardiac CTA ([facility]): Ca score 0. Right dominant. Normal coronaries.   INTRACRANIAL ANEURYSM REPAIR     MYOMECTOMY  2005   RADIOLOGY WITH ANESTHESIA N/A 11/21/2014   Procedure: Embolization;  Surgeon: Luanne Bras, MD;  Location: [place];  Service: Radiology;  Laterality: N/A;   US ECHOCARDIOGRAPHY  05/18/11   05/18/11 Echo ([place]): NL chamber size, LV systolic function, wall motion. LVEF 77%. Mild LVH. Mild MR/TR. NL PAP.    ROS: Review of Systems Negative except as stated above  PHYSICAL EXAM: BP 128/80   Pulse (!) 59   Temp 98.3 F (36.8 C)   Resp 16   Wt 124 lb (56.2 kg)   LMP  (LMP Unknown)   SpO2 97%   BMI 21.97 kg/m   Physical Exam HENT:     Head: Normocephalic and atraumatic.  Eyes:     Extraocular Movements: Extraocular movements intact.     Conjunctiva/sclera: Conjunctivae normal.      Pupils: Pupils are equal, round, and reactive to light.  Cardiovascular:     Rate and Rhythm: Bradycardia present.     Pulses: Normal pulses.     Heart sounds: Normal heart sounds.  Pulmonary:     Effort: Pulmonary effort is normal.     Breath sounds: Normal breath sounds.  Abdominal:     General: Bowel sounds are normal.     Palpations: Abdomen is soft.  Genitourinary:    Comments: Directly lateral  to left lower labia majora firm movable knot without drainage. Sharon Mendoza, CMA present during exam. Musculoskeletal:     Cervical back: Normal, normal range of motion and neck supple.     Thoracic back: Normal.     Lumbar back: Normal.  Neurological:     General: No focal deficit present.     Mental Status: She is alert and oriented to person, place, and time.  Psychiatric:        Mood and Affect: Mood normal.        Behavior: Behavior normal.     ASSESSMENT AND PLAN: 1. Skin nodule - Considering close proximity to left lower labia majora discussed with patient to see if her gynecologist Sumner Boast, MD has any recommendations at her appointment on 10/14/2021. If not will refer patient out to Dermatology for further evaluation and management . - Follow-up with primary provider as scheduled.  2. Essential (primary) hypertension - Continue Atenolol and Enalapril as prescribed.  - Counseled on blood pressure goal of less than 130/80, low-sodium, DASH diet, medication compliance, and 150 minutes of moderate intensity exercise per week as tolerated. Counseled on medication adherence and adverse effects. - BMP to evaluate kidney function and electrolyte balance. - Follow-up with primary provider in 4 months or sooner if needed.  - atenolol (TENORMIN) 50 MG tablet; Take 1 tablet (50 mg total) by mouth daily.  Dispense: 30 tablet; Refill: 3 - enalapril (VASOTEC) 5 MG tablet; Take 1 tablet (5 mg total) by mouth daily.  Dispense: 30 tablet; Refill: 3 - Basic Metabolic Panel    Patient  was given the opportunity to ask questions.  Patient verbalized understanding of the plan and was able to repeat key elements of the plan. Patient was given clear instructions to go to Emergency Department or return to medical center if symptoms don't improve, worsen, or new problems develop.The patient verbalized understanding.   Orders Placed This Encounter  Procedures   Basic Metabolic Panel     Requested Prescriptions   Signed Prescriptions Disp Refills   atenolol (TENORMIN) 50 MG tablet 30 tablet 3    Sig: Take 1 tablet (50 mg total) by mouth daily.   enalapril (VASOTEC) 5 MG tablet 30 tablet 3    Sig: Take 1 tablet (5 mg total) by mouth daily.    Return in about 4 months (around 02/01/2022) for Follow-Up or next available chronic care mgmt.  Camillia Herter, NP

## 2021-10-01 NOTE — Progress Notes (Signed)
Pt presents for fall that cause hematoma knot on left side of buttocks

## 2021-10-02 LAB — BASIC METABOLIC PANEL
BUN/Creatinine Ratio: 20 (ref 9–23)
BUN: 17 mg/dL (ref 6–24)
CO2: 25 mmol/L (ref 20–29)
Calcium: 10 mg/dL (ref 8.7–10.2)
Chloride: 104 mmol/L (ref 96–106)
Creatinine, Ser: 0.83 mg/dL (ref 0.57–1.00)
Glucose: 81 mg/dL (ref 70–99)
Potassium: 4.9 mmol/L (ref 3.5–5.2)
Sodium: 140 mmol/L (ref 134–144)
eGFR: 85 mL/min/{1.73_m2} (ref >59)

## 2021-10-06 ENCOUNTER — Ambulatory Visit: Payer: 59 | Admitting: Family

## 2021-10-07 ENCOUNTER — Encounter: Payer: Self-pay | Admitting: Obstetrics and Gynecology

## 2021-10-07 ENCOUNTER — Ambulatory Visit: Payer: 59 | Admitting: Obstetrics and Gynecology

## 2021-10-07 VITALS — BP 110/78 | Wt 125.0 lb

## 2021-10-07 DIAGNOSIS — L02215 Cutaneous abscess of perineum: Secondary | ICD-10-CM | POA: Diagnosis not present

## 2021-10-07 MED ORDER — SULFAMETHOXAZOLE-TRIMETHOPRIM 800-160 MG PO TABS: 1.0000 | ORAL_TABLET | Freq: Two times a day (BID) | ORAL | 0 refills | Status: DC

## 2021-10-07 NOTE — Progress Notes (Signed)
GYNECOLOGY  VISIT   HPI: 51 y.o.   Single Black or African American Not Hispanic or Latino  female   G1P1 with No LMP recorded (lmp unknown). Patient is postmenopausal.   here for a lump on her buttock. She states that she fell down the steps about two weeks and she had two lumps. No she just has one on the bottom left side of her buttock. She states that she saw her pcp her told her to go to her gyn.   She fell down 4 steps on June, 29. About an hour after the fall she noticed a tender, hard bump on her perineum. It is smaller, still uncomfortable. No drainage.  GYNECOLOGIC HISTORY: No LMP recorded (lmp unknown). Patient is postmenopausal. Contraception:not sexually active.  Menopausal hormone therapy: none         OB History     Gravida  1   Para  1   Term      Preterm      AB      Living  1      SAB      IAB      Ectopic      Multiple      Live Births                 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 04/30/2021   Family history of coronary artery disease 05/08/2020   Dyslipidemia 04/24/2020   Gastroesophageal reflux disease without esophagitis 04/24/2020   Pain in right knee 04/24/2020   History of cerebral aneurysm 04/17/2018   Genetic testing 06/08/2017   Family history of colon cancer    Family history of breast cancer    Essential hypertension 02/18/2015   Hyperlipidemia 02/18/2015   Chest pain 02/18/2015   Brain aneurysm 11/21/2014   Pain in joint, shoulder region 12/10/2013   LBP radiating to left leg 12/10/2013   Aneurysm, cerebral, nonruptured 12/10/2013   Fibroid uterus 06/22/2012   Menorrhagia 06/22/2012   Dysmenorrhea 06/22/2012    Past Medical History:  Diagnosis Date   Abnormal pap    Cerebral aneurysm without rupture    Chest pain radiating to arm    Family history of breast cancer     Family history of colon cancer    Family history of heart disease    Gastroesophageal reflux    Gestational diabetes    Hyperlipidemia    Hypertension    Mitral valve regurgitation    PONV (postoperative nausea and vomiting)     Past Surgical History:  Procedure Laterality Date   BREAST BIOPSY     age 1 left breast   CARDIOVASCULAR STRESS TEST  05/19/11   05/19/11 Nuclear stress test ([facility]): Normal study, EF 87%   CESAREAN SECTION  09/2005   CT CORONARY CA SCORING  06/23/12   06/23/12 Cardiac CTA ([facility]): Ca score 0. Right dominant. Normal coronaries.   INTRACRANIAL ANEURYSM REPAIR     MYOMECTOMY  2005   RADIOLOGY WITH ANESTHESIA N/A 11/21/2014   Procedure: Embolization;  Surgeon: Luanne Bras, MD;  Location: [facility];  Service: Radiology;  Laterality: N/A;   US ECHOCARDIOGRAPHY  05/18/11   05/18/11 Echo ([place]): NL chamber size, LV systolic function, wall motion. LVEF 77%. Mild LVH. Mild MR/TR. NL PAP.    Current Outpatient Medications  Medication  Sig Dispense Refill   aspirin 81 MG tablet Take 81 mg by mouth daily.     atenolol (TENORMIN) 50 MG tablet Take 1 tablet (50 mg total) by mouth daily. 30 tablet 3   Cetirizine HCl 10 MG CAPS Take 1 capsule (10 mg total) by mouth daily. 15 capsule 0   clopidogrel (PLAVIX) 75 MG tablet Take 0.5 tablets (37.5 mg total) by mouth every other day. 15 tablet 2   enalapril (VASOTEC) 5 MG tablet Take 1 tablet (5 mg total) by mouth daily. 30 tablet 3   famotidine (PEPCID) 40 MG tablet Take 1 tablet (40 mg total) by mouth at bedtime. 30 tablet 0   triamcinolone cream (KENALOG) 0.5 % Apply 1 application. topically 2 (two) times daily. 30 g 0   valACYclovir (VALTREX) 1000 MG tablet Take 2 tablets po q 12 hours for 2 doses. 30 tablet 2   rosuvastatin (CRESTOR) 10 MG tablet Take 1 tablet (10 mg total) by mouth daily. 90 tablet 3   No current facility-administered medications for this visit.     ALLERGIES:  Atorvastatin  Family History  Problem Relation Age of Onset   Hypertension Mother    Liver disease Mother    Colon cancer Father 46   Throat cancer Father 74   Heart disease Brother    Aneurysm Brother    Heart attack Brother    Asthma Son    Aneurysm Sister    Aneurysm Brother    Aneurysm Sister    Heart attack Sister    Aneurysm Sister 18   Stroke Maternal Grandmother    Breast cancer Paternal Aunt 37       metastatic   Breast cancer Paternal Aunt 57       metastatic   Colon polyps Brother    Breast cancer Cousin    Breast cancer Paternal Aunt     Social History   Socioeconomic History   Marital status: Single    Spouse name: Not on file   Number of children: 1   Years of education: BS   Highest education level: Not on file  Occupational History   Occupation: PHARMACIST    Employer: [employer]  Tobacco Use   Smoking status: Never    Passive exposure: Never   Smokeless tobacco: Never  Vaping Use   Vaping Use: Never used  Substance and Sexual Activity   Alcohol use: No    Alcohol/week: 0.0 standard drinks of alcohol   Drug use: No   Sexual activity: Yes    Partners: Male  Other Topics Concern   Not on file  Social History Narrative   Not on file   Social Determinants of Health   Financial Resource Strain: Not on file  Food Insecurity: Not on file  Transportation Needs: Not on file  Physical Activity: Not on file  Stress: Not on file  Social Connections: Not on file  Intimate Partner Violence: Not on file    Review of Systems  All other systems reviewed and are negative.   PHYSICAL EXAMINATION:    BP 110/78   Wt 125 lb (56.7 kg)   LMP  (LMP Unknown)   BMI 22.14 kg/m     General appearance: alert, cooperative and appears stated age  Pelvic: External genitalia:  on the left inner buttock is an ~1 cm abscess, with gentle pressure pus drained.   Chaperone was present for exam.  1. Cutaneous abscess of perineum -Use warm compresses and  hot soaks -  sulfamethoxazole-trimethoprim (BACTRIM DS) 800-160 MG tablet; Take 1 tablet by mouth 2 (two) times daily. One PO BID x 5 days  Dispense: 10 tablet; Refill: 0

## 2021-10-07 NOTE — Patient Instructions (Signed)
Skin Abscess  A skin abscess is an infected area on or under your skin that contains a collection of pus and other material. An abscess may also be called a furuncle, carbuncle, or boil. An abscess can occur in or on almost any part of your body. Some abscesses break open (rupture) on their own. Most continue to get worse unless they are treated. The infection can spread deeper into the body and eventually into your blood, which can make you feel ill. Treatment usually involves draining the abscess. What are the causes? An abscess occurs when germs, like bacteria, pass through your skin and cause an infection. This may be caused by: A scrape or cut on your skin. A puncture wound through your skin, including a needle injection or insect bite. Blocked oil or sweat glands. Blocked and infected hair follicles. A cyst that forms beneath your skin (sebaceous cyst) and becomes infected. What increases the risk? This condition is more likely to develop in people who: Have a weak body defense system (immune system). Have diabetes. Have dry and irritated skin. Get frequent injections or use illegal IV drugs. Have a foreign body in a wound, such as a splinter. Have problems with their lymph system or veins. What are the signs or symptoms? Symptoms of this condition include: A painful, firm bump under the skin. A bump with pus at the top. This may break through the skin and drain. Other symptoms include: Redness surrounding the abscess site. Warmth. Swelling of the lymph nodes (glands) near the abscess. Tenderness. A sore on the skin. How is this diagnosed? This condition may be diagnosed based on: A physical exam. Your medical history. A sample of pus. This may be used to find out what is causing the infection. Blood tests. Imaging tests, such as an ultrasound, CT scan, or MRI. How is this treated? A small abscess that drains on its own may not need treatment. Treatment for larger abscesses  may include: Moist heat or heat pack applied to the area several times a day. A procedure to drain the abscess (incision and drainage). Antibiotic medicines. For a severe abscess, you may first get antibiotics through an IV and then change to antibiotics by mouth. Follow these instructions at home: Medicines  Take over-the-counter and prescription medicines only as told by your health care provider. If you were prescribed an antibiotic medicine, take it as told by your health care provider. Do not stop taking the antibiotic even if you start to feel better. Abscess care  If you have an abscess that has not drained, apply heat to the affected area. Use the heat source that your health care provider recommends, such as a moist heat pack or a heating pad. Place a towel between your skin and the heat source. Leave the heat on for 20-30 minutes. Remove the heat if your skin turns bright red. This is especially important if you are unable to feel pain, heat, or cold. You may have a greater risk of getting burned. Follow instructions from your health care provider about how to take care of your abscess. Make sure you: Cover the abscess with a bandage (dressing). Change your dressing or gauze as told by your health care provider. Wash your hands with soap and water before you change the dressing or gauze. If soap and water are not available, use hand sanitizer. Check your abscess every day for signs of a worsening infection. Check for: More redness, swelling, or pain. More fluid or blood. Warmth.   More pus or a bad smell. General instructions To avoid spreading the infection: Do not share personal care items, towels, or hot tubs with others. Avoid making skin contact with other people. Keep all follow-up visits as told by your health care provider. This is important. Contact a health care provider if you have: More redness, swelling, or pain around your abscess. More fluid or blood coming from  your abscess. Warm skin around your abscess. More pus or a bad smell coming from your abscess. Muscle aches. Chills or a general ill feeling. Get help right away if you: Have severe pain. See red streaks on your skin spreading away from the abscess. See redness that spreads quickly. Have a fever or chills. Summary A skin abscess is an infected area on or under your skin that contains a collection of pus and other material. A small abscess that drains on its own may not need treatment. Treatment for larger abscesses may include having a procedure to drain the abscess and taking an antibiotic. This information is not intended to replace advice given to you by your health care provider. Make sure you discuss any questions you have with your health care provider. Document Revised: 12/15/2020 Document Reviewed: 12/15/2020 Elsevier Patient Education  [place].

## 2021-10-14 ENCOUNTER — Encounter: Payer: Self-pay | Admitting: Family

## 2021-10-14 ENCOUNTER — Ambulatory Visit: Payer: 59 | Admitting: Obstetrics and Gynecology

## 2021-10-26 ENCOUNTER — Other Ambulatory Visit: Payer: Self-pay | Admitting: Radiology

## 2021-10-26 MED ORDER — CLOPIDOGREL BISULFATE 75 MG PO TABS: 37.5000 mg | ORAL_TABLET | ORAL | 6 refills | Status: DC

## 2021-12-28 ENCOUNTER — Encounter: Payer: Self-pay | Admitting: Family

## 2021-12-29 ENCOUNTER — Telehealth: Payer: Self-pay | Admitting: Physician Assistant

## 2021-12-29 NOTE — Telephone Encounter (Signed)
  Received a phone call from the [facility] regarding administration of the Shingrix (Shingles) vaccine.  They requested a review of her chart to make sure there was no contraindication to her receiving the vaccine.  Chart reviewed and notes/dictations from Dr. Estanislado Pandy reviewed.  From NIR standpoint there is no contraindication to Shingrix vaccine. Ok for her to receive the vaccine.  Majed Pellegrin S Maleik Vanderzee PA-C 12/29/2021 2:04 PM

## 2021-12-30 NOTE — Progress Notes (Unsigned)
Patient ID: Sharon Mendoza, female    DOB: September 16, 1970  MRN: [id number]  CC: No chief complaint on file.   Subjective: Sharon Mendoza is a 51 y.o. female who presents for  Her concerns today include:    Airborne chemical exposure followup with pcp. Lung health/chest xray   Gm. On 12/16/21 we were exposed to airborne chemicals at work. Bldg was not closed until 4:30 that initial day. Had to close bldg Thursday and Friday.  I came in Monday, became sick again. Went to employee health as recommended.  Coughing, burning in chest and throat,  metallic taste. Employer doesn't plan to do anything else. We are being told to see our PCP. Can you refer me somewhere forca chest xray just to be safe?   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 04/30/2021   Family history of coronary artery disease 05/08/2020   Dyslipidemia 04/24/2020   Gastroesophageal reflux disease without esophagitis 04/24/2020   Pain in right knee 04/24/2020   History of cerebral aneurysm 04/17/2018   Genetic testing 06/08/2017   Family history of colon cancer    Family history of breast cancer    Essential hypertension 02/18/2015   Hyperlipidemia 02/18/2015   Chest pain 02/18/2015   Brain aneurysm 11/21/2014   Pain in joint, shoulder region 12/10/2013   LBP radiating to left leg 12/10/2013   Aneurysm, cerebral, nonruptured 12/10/2013   Fibroid uterus 06/22/2012   Menorrhagia 06/22/2012   Dysmenorrhea 06/22/2012     Current Outpatient Medications on File Prior to Visit  Medication Sig Dispense Refill   aspirin 81 MG tablet Take 81 mg by mouth daily.     atenolol (TENORMIN) 50 MG tablet Take 1 tablet (50 mg total) by mouth daily. 30 tablet 3   Cetirizine HCl 10 MG CAPS Take 1 capsule (10 mg total) by mouth daily. 15 capsule 0   clopidogrel (PLAVIX) 75 MG tablet Take 0.5 tablets  (37.5 mg total) by mouth every other day. 15 tablet 6   enalapril (VASOTEC) 5 MG tablet Take 1 tablet (5 mg total) by mouth daily. 30 tablet 3   famotidine (PEPCID) 40 MG tablet Take 1 tablet (40 mg total) by mouth at bedtime. 30 tablet 0   rosuvastatin (CRESTOR) 10 MG tablet Take 1 tablet (10 mg total) by mouth daily. 90 tablet 3   sulfamethoxazole-trimethoprim (BACTRIM DS) 800-160 MG tablet Take 1 tablet by mouth 2 (two) times daily. One PO BID x 5 days 10 tablet 0   triamcinolone cream (KENALOG) 0.5 % Apply 1 application. topically 2 (two) times daily. 30 g 0   valACYclovir (VALTREX) 1000 MG tablet Take 2 tablets po q 12 hours for 2 doses. 30 tablet 2   [DISCONTINUED] clopidogrel (PLAVIX) 75 MG tablet Take 0.5 tablets (37.5 mg total) by mouth every other day. 15 tablet 2   No current facility-administered medications on file prior to visit.    Allergies  Allergen Reactions   Atorvastatin Hives and Itching    Social History   Socioeconomic History   Marital status: Single    Spouse name: Not on file   Number of children: 1   Years of education: BS   Highest education level: Not on file  Occupational History   Occupation: PHARMACIST    Employer: [employer]  Tobacco Use   Smoking status: Never  Passive exposure: Never   Smokeless tobacco: Never  Vaping Use   Vaping Use: Never used  Substance and Sexual Activity   Alcohol use: No    Alcohol/week: 0.0 standard drinks of alcohol   Drug use: No   Sexual activity: Yes    Partners: Male  Other Topics Concern   Not on file  Social History Narrative   Not on file   Social Determinants of Health   Financial Resource Strain: Not on file  Food Insecurity: Not on file  Transportation Needs: Not on file  Physical Activity: Not on file  Stress: Not on file  Social Connections: Not on file  Intimate Partner Violence: Not on file    Family History  Problem Relation Age of Onset   Hypertension Mother    Liver disease  Mother    Colon cancer Father 68   Throat cancer Father 30   Heart disease Brother    Aneurysm Brother    Heart attack Brother    Asthma Son    Aneurysm Sister    Aneurysm Brother    Aneurysm Sister    Heart attack Sister    Aneurysm Sister 18   Stroke Maternal Grandmother    Breast cancer Paternal Aunt 60       metastatic   Breast cancer Paternal Aunt 45       metastatic   Colon polyps Brother    Breast cancer Cousin    Breast cancer Paternal Aunt     Past Surgical History:  Procedure Laterality Date   BREAST BIOPSY     age 51 left breast   CARDIOVASCULAR STRESS TEST  05/19/11   05/19/11 Nuclear stress test ([facility]): Normal study, EF 87%   CESAREAN SECTION  09/2005   CT CORONARY CA SCORING  06/23/12   06/23/12 Cardiac CTA ([facility]): Ca score 0. Right dominant. Normal coronaries.   INTRACRANIAL ANEURYSM REPAIR     MYOMECTOMY  2005   RADIOLOGY WITH ANESTHESIA N/A 11/21/2014   Procedure: Embolization;  Surgeon: Luanne Bras, MD;  Location: [place] [facility];  Service: Radiology;  Laterality: N/A;   US ECHOCARDIOGRAPHY  05/18/11   05/18/11 Echo ([place]): NL chamber size, LV systolic function, wall motion. LVEF 77%. Mild LVH. Mild MR/TR. NL PAP.    ROS: Review of Systems Negative except as stated above  PHYSICAL EXAM: LMP  (LMP Unknown)   Physical Exam  {female adult master:310786} {female adult master:310785}     Latest Ref Rng & Units 10/01/2021   11:48 AM 04/30/2021    9:42 AM 08/07/2020   12:00 AM  CMP  Glucose 70 - 99 mg/dL 81  82  74   BUN 6 - 24 mg/dL '17  17  14   '$ Creatinine 0.57 - 1.00 mg/dL 0.83  0.87  0.80   Sodium 134 - 144 mmol/L 140  140  141   Potassium 3.5 - 5.2 mmol/L 4.9  4.5  4.9   Chloride 96 - 106 mmol/L 104  105  102   CO2 20 - 29 mmol/L '25  30  27   '$ Calcium 8.7 - 10.2 mg/dL 10.0  9.7  10.2   Total Protein 6.1 - 8.1 g/dL  6.7  7.0   Total Bilirubin 0.2 - 1.2 mg/dL  0.4  0.3   Alkaline Phos 44 - 121 IU/L   97   AST 10 - 35 U/L   20  16   ALT 6 - 29 U/L  14  7  Lipid Panel     Component Value Date/Time   CHOL 213 (H) 04/30/2021 0942   CHOL 221 08/07/2020 0000   TRIG 67 04/30/2021 0942   HDL 68 04/30/2021 0942   HDL 75 08/07/2020 0000   CHOLHDL 3.1 04/30/2021 0942   VLDL 15 12/16/2015 0851   LDLCALC 129 (H) 04/30/2021 0942    CBC    Component Value Date/Time   WBC 4.2 04/30/2021 0942   RBC 4.46 04/30/2021 0942   HGB 12.9 04/30/2021 0942   HGB 13.5 04/18/2019 0938   HGB 12.7 11/28/2013 0810   HCT 40.2 04/30/2021 0942   HCT 41.5 04/18/2019 0938   PLT 219 04/30/2021 0942   PLT 224 04/18/2019 0938   MCV 90.1 04/30/2021 0942   MCV 90 04/18/2019 0938   MCH 28.9 04/30/2021 0942   MCHC 32.1 04/30/2021 0942   RDW 13.3 04/30/2021 0942   RDW 13.3 04/18/2019 0938   LYMPHSABS 2.4 11/22/2014 0450   MONOABS 0.8 11/22/2014 0450   EOSABS 0.0 11/22/2014 0450   BASOSABS 0.0 11/22/2014 0450    ASSESSMENT AND PLAN:  There are no diagnoses linked to this encounter.   Patient was given the opportunity to ask questions.  Patient verbalized understanding of the plan and was able to repeat key elements of the plan. Patient was given clear instructions to go to Emergency Department or return to medical center if symptoms don't improve, worsen, or new problems develop.The patient verbalized understanding.   No orders of the defined types were placed in this encounter.    Requested Prescriptions    No prescriptions requested or ordered in this encounter    No follow-ups on file.  Camillia Herter, NP

## 2022-01-01 ENCOUNTER — Ambulatory Visit: Payer: 59 | Admitting: Family

## 2022-01-01 ENCOUNTER — Ambulatory Visit (INDEPENDENT_AMBULATORY_CARE_PROVIDER_SITE_OTHER): Payer: 59

## 2022-01-01 VITALS — BP 123/77 | HR 63 | Temp 98.3°F | Resp 16 | Wt 125.0 lb

## 2022-01-01 DIAGNOSIS — R059 Cough, unspecified: Secondary | ICD-10-CM

## 2022-01-01 DIAGNOSIS — R12 Heartburn: Secondary | ICD-10-CM

## 2022-01-01 DIAGNOSIS — Z77098 Contact with and (suspected) exposure to other hazardous, chiefly nonmedicinal, chemicals: Secondary | ICD-10-CM | POA: Diagnosis not present

## 2022-01-01 DIAGNOSIS — R062 Wheezing: Secondary | ICD-10-CM | POA: Diagnosis not present

## 2022-01-01 DIAGNOSIS — R491 Aphonia: Secondary | ICD-10-CM

## 2022-01-01 MED ORDER — ALBUTEROL SULFATE HFA 108 (90 BASE) MCG/ACT IN AERS: 1.0000 | INHALATION_SPRAY | Freq: Four times a day (QID) | RESPIRATORY_TRACT | 1 refills | Status: DC | PRN

## 2022-01-01 MED ORDER — BENZONATATE 100 MG PO CAPS: 100.0000 mg | ORAL_CAPSULE | Freq: Three times a day (TID) | ORAL | 2 refills | Status: DC | PRN

## 2022-01-01 MED ORDER — ALBUTEROL SULFATE (2.5 MG/3ML) 0.083% IN NEBU: 2.5000 mg | INHALATION_SOLUTION | Freq: Four times a day (QID) | RESPIRATORY_TRACT | 2 refills | Status: DC | PRN

## 2022-01-01 MED ORDER — SUCRALFATE 1 GM/10ML PO SUSP: 1.0000 g | Freq: Three times a day (TID) | ORAL | 2 refills | Status: DC

## 2022-01-01 NOTE — Progress Notes (Unsigned)
Pt presents for chemical exposure -did have metallic taste in mouth -mild burning sensation in chest  -did lose voice but has returned  -per patient she was having an episodic cough

## 2022-01-06 ENCOUNTER — Encounter: Payer: Self-pay | Admitting: Family

## 2022-01-18 NOTE — Progress Notes (Unsigned)
Synopsis: Referred in November 2023 for cough and wheezing  Subjective:   PATIENT ID: Sharon Mendoza GENDER: female DOB: 11/26/1970, MRN: [id number]   HPI  No chief complaint on file.   *** Record review: October 2023 primary care visit reviewed where the patient was seen after she had had a [employer] exposure at work.  She works as a Software engineer.  Apparently her office building was closed for several days after this.  She experienced chest tightness, shortness of breath and burning in her chest.  Supportive care was provided, chest x-ray ordered, referred to pulmonary.  Past Medical History:  Diagnosis Date   Abnormal pap    Cerebral aneurysm without rupture    Chest pain radiating to arm    Family history of breast cancer    Family history of colon cancer    Family history of heart disease    Gastroesophageal reflux    Gestational diabetes    Hyperlipidemia    Hypertension    Mitral valve regurgitation    PONV (postoperative nausea and vomiting)      Family History  Problem Relation Age of Onset   Hypertension Mother    Liver disease Mother    Colon cancer Father 73   Throat cancer Father 64   Heart disease Brother    Aneurysm Brother    Heart attack Brother    Asthma Son    Aneurysm Sister    Aneurysm Brother    Aneurysm Sister    Heart attack Sister    Aneurysm Sister 18   Stroke Maternal Grandmother    Breast cancer Paternal Aunt 61       metastatic   Breast cancer Paternal Aunt 7       metastatic   Colon polyps Brother    Breast cancer Cousin    Breast cancer Paternal Aunt      Social History   Socioeconomic History   Marital status: Single    Spouse name: Not on file   Number of children: 1   Years of education: BS   Highest education level: Not on file  Occupational History   Occupation: PHARMACIST    Employer: [employer]  Tobacco Use   Smoking status: Never    Passive exposure: Never   Smokeless tobacco: Never  Vaping Use   Vaping  Use: Never used  Substance and Sexual Activity   Alcohol use: No    Alcohol/week: 0.0 standard drinks of alcohol   Drug use: No   Sexual activity: Yes    Partners: Male  Other Topics Concern   Not on file  Social History Narrative   Not on file   Social Determinants of Health   Financial Resource Strain: Not on file  Food Insecurity: Not on file  Transportation Needs: Not on file  Physical Activity: Not on file  Stress: Not on file  Social Connections: Not on file  Intimate Partner Violence: Not on file     Allergies  Allergen Reactions   Atorvastatin Hives and Itching     Outpatient Medications Prior to Visit  Medication Sig Dispense Refill   albuterol (PROVENTIL) (2.5 MG/3ML) 0.083% nebulizer solution Take 3 mLs (2.5 mg total) by nebulization every 6 (six) hours as needed for wheezing or shortness of breath. 150 mL 2   albuterol (VENTOLIN HFA) 108 (90 Base) MCG/ACT inhaler Inhale 1-2 puffs into the lungs every 6 (six) hours as needed for wheezing or shortness of breath. 18 g 1   aspirin  81 MG tablet Take 81 mg by mouth daily.     atenolol (TENORMIN) 50 MG tablet Take 1 tablet (50 mg total) by mouth daily. 30 tablet 3   benzonatate (TESSALON) 100 MG capsule Take 1 capsule (100 mg total) by mouth 3 (three) times daily as needed for cough. 30 capsule 2   Cetirizine HCl 10 MG CAPS Take 1 capsule (10 mg total) by mouth daily. 15 capsule 0   clopidogrel (PLAVIX) 75 MG tablet Take 0.5 tablets (37.5 mg total) by mouth every other day. 15 tablet 6   enalapril (VASOTEC) 5 MG tablet Take 1 tablet (5 mg total) by mouth daily. 30 tablet 3   famotidine (PEPCID) 40 MG tablet Take 1 tablet (40 mg total) by mouth at bedtime. 30 tablet 0   rosuvastatin (CRESTOR) 10 MG tablet Take 1 tablet (10 mg total) by mouth daily. 90 tablet 3   sucralfate (CARAFATE) 1 GM/10ML suspension Take 10 mLs (1 g total) by mouth in the morning, at noon, and at bedtime. 414 mL 2   sulfamethoxazole-trimethoprim  (BACTRIM DS) 800-160 MG tablet Take 1 tablet by mouth 2 (two) times daily. One PO BID x 5 days 10 tablet 0   triamcinolone cream (KENALOG) 0.5 % Apply 1 application. topically 2 (two) times daily. 30 g 0   valACYclovir (VALTREX) 1000 MG tablet Take 2 tablets po q 12 hours for 2 doses. 30 tablet 2   No facility-administered medications prior to visit.    ROS    Objective:  Physical Exam   There were no vitals filed for this visit.  ***  CBC    Component Value Date/Time   WBC 4.2 04/30/2021 0942   RBC 4.46 04/30/2021 0942   HGB 12.9 04/30/2021 0942   HGB 13.5 04/18/2019 0938   HGB 12.7 11/28/2013 0810   HCT 40.2 04/30/2021 0942   HCT 41.5 04/18/2019 0938   PLT 219 04/30/2021 0942   PLT 224 04/18/2019 0938   MCV 90.1 04/30/2021 0942   MCV 90 04/18/2019 0938   MCH 28.9 04/30/2021 0942   MCHC 32.1 04/30/2021 0942   RDW 13.3 04/30/2021 0942   RDW 13.3 04/18/2019 0938   LYMPHSABS 2.4 11/22/2014 0450   MONOABS 0.8 11/22/2014 0450   EOSABS 0.0 11/22/2014 0450   BASOSABS 0.0 11/22/2014 0450     Chest imaging: 12/2021 CXR > normal lung fields, personally reviewed  PFT:  Labs:  Path:  Echo:  Heart Catheterization:       Assessment & Plan:   No diagnosis found.  Discussion: ***  Immunizations: Immunization History  Administered Date(s) Administered   Influenza Split 02/04/2017, 12/07/2017   Influenza,inj,Quad PF,6+ Mos 01/03/2019   Influenza-Unspecified 02/04/2017, 01/03/2020   PFIZER(Purple Top)SARS-COV-2 Vaccination 05/29/2019, 06/19/2019, 04/04/2020   Tdap 12/04/2014     Current Outpatient Medications:    albuterol (PROVENTIL) (2.5 MG/3ML) 0.083% nebulizer solution, Take 3 mLs (2.5 mg total) by nebulization every 6 (six) hours as needed for wheezing or shortness of breath., Disp: 150 mL, Rfl: 2   albuterol (VENTOLIN HFA) 108 (90 Base) MCG/ACT inhaler, Inhale 1-2 puffs into the lungs every 6 (six) hours as needed for wheezing or shortness of  breath., Disp: 18 g, Rfl: 1   aspirin 81 MG tablet, Take 81 mg by mouth daily., Disp: , Rfl:    atenolol (TENORMIN) 50 MG tablet, Take 1 tablet (50 mg total) by mouth daily., Disp: 30 tablet, Rfl: 3   benzonatate (TESSALON) 100 MG capsule, Take 1 capsule (  100 mg total) by mouth 3 (three) times daily as needed for cough., Disp: 30 capsule, Rfl: 2   Cetirizine HCl 10 MG CAPS, Take 1 capsule (10 mg total) by mouth daily., Disp: 15 capsule, Rfl: 0   clopidogrel (PLAVIX) 75 MG tablet, Take 0.5 tablets (37.5 mg total) by mouth every other day., Disp: 15 tablet, Rfl: 6   enalapril (VASOTEC) 5 MG tablet, Take 1 tablet (5 mg total) by mouth daily., Disp: 30 tablet, Rfl: 3   famotidine (PEPCID) 40 MG tablet, Take 1 tablet (40 mg total) by mouth at bedtime., Disp: 30 tablet, Rfl: 0   rosuvastatin (CRESTOR) 10 MG tablet, Take 1 tablet (10 mg total) by mouth daily., Disp: 90 tablet, Rfl: 3   sucralfate (CARAFATE) 1 GM/10ML suspension, Take 10 mLs (1 g total) by mouth in the morning, at noon, and at bedtime., Disp: 414 mL, Rfl: 2   sulfamethoxazole-trimethoprim (BACTRIM DS) 800-160 MG tablet, Take 1 tablet by mouth 2 (two) times daily. One PO BID x 5 days, Disp: 10 tablet, Rfl: 0   triamcinolone cream (KENALOG) 0.5 %, Apply 1 application. topically 2 (two) times daily., Disp: 30 g, Rfl: 0   valACYclovir (VALTREX) 1000 MG tablet, Take 2 tablets po q 12 hours for 2 doses., Disp: 30 tablet, Rfl: 2

## 2022-01-20 ENCOUNTER — Encounter: Payer: Self-pay | Admitting: Pulmonary Disease

## 2022-01-20 ENCOUNTER — Ambulatory Visit: Payer: 59 | Admitting: Pulmonary Disease

## 2022-01-20 VITALS — BP 122/78 | HR 64 | Temp 97.8°F | Ht 63.0 in | Wt 128.6 lb

## 2022-01-20 DIAGNOSIS — R058 Other specified cough: Secondary | ICD-10-CM | POA: Diagnosis not present

## 2022-01-20 DIAGNOSIS — J45909 Unspecified asthma, uncomplicated: Secondary | ICD-10-CM

## 2022-01-20 NOTE — Patient Instructions (Signed)
Reactive airways disease: Upper airway cough syndrome: Spirometry testing today You need to try to suppress your cough to allow your larynx (voice box) to heal.  For three days don't talk, laugh, sing, or clear your throat. Do everything you can to suppress the cough during this time. Use hard candies (sugarless Jolly Ranchers) or non-mint or non-menthol containing cough drops during this time to soothe your throat.  Use a cough suppressant (Delsym or what I have prescribed you) around the clock during this time.  After three days, gradually increase the use of your voice and back off on the cough suppressants.  Follow up with me if no improvement or if worse

## 2022-01-27 NOTE — Progress Notes (Signed)
Patient ID: Sharon Mendoza, female    DOB: 12/28/1970  MRN: [id number]  CC: Chronic Care Management   Subjective: Sharon Mendoza is a 51 y.o. female who presents for chronic care management.   Her concerns today include:  Doing well on blood pressure medications, no issues/concerns. Since last visit established with pulmonologist. No further issues/concerns today.   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 04/30/2021   Family history of coronary artery disease 05/08/2020   Dyslipidemia 04/24/2020   Gastroesophageal reflux disease without esophagitis 04/24/2020   Pain in right knee 04/24/2020   History of cerebral aneurysm 04/17/2018   Genetic testing 06/08/2017   Family history of colon cancer    Family history of breast cancer    Essential hypertension 02/18/2015   Hyperlipidemia 02/18/2015   Chest pain 02/18/2015   Brain aneurysm 11/21/2014   Pain in joint, shoulder region 12/10/2013   LBP radiating to left leg 12/10/2013   Aneurysm, cerebral, nonruptured 12/10/2013   Fibroid uterus 06/22/2012   Menorrhagia 06/22/2012   Dysmenorrhea 06/22/2012     Current Outpatient Medications on File Prior to Visit  Medication Sig Dispense Refill   albuterol (PROVENTIL) (2.5 MG/3ML) 0.083% nebulizer solution Take 3 mLs (2.5 mg total) by nebulization every 6 (six) hours as needed for wheezing or shortness of breath. 150 mL 2   albuterol (VENTOLIN HFA) 108 (90 Base) MCG/ACT inhaler Inhale 1-2 puffs into the lungs every 6 (six) hours as needed for wheezing or shortness of breath. 18 g 1   aspirin 81 MG tablet Take 81 mg by mouth daily.     benzonatate (TESSALON) 100 MG capsule Take 1 capsule (100 mg total) by mouth 3 (three) times daily as needed for cough. 30 capsule 2   Cetirizine HCl 10 MG CAPS Take 1 capsule (10 mg total) by mouth daily. 15 capsule  0   clopidogrel (PLAVIX) 75 MG tablet Take 0.5 tablets (37.5 mg total) by mouth every other day. 15 tablet 6   famotidine (PEPCID) 40 MG tablet Take 1 tablet (40 mg total) by mouth at bedtime. 30 tablet 0   rosuvastatin (CRESTOR) 10 MG tablet Take 1 tablet (10 mg total) by mouth daily. 90 tablet 3   sucralfate (CARAFATE) 1 GM/10ML suspension Take 10 mLs (1 g total) by mouth in the morning, at noon, and at bedtime. 414 mL 2   triamcinolone cream (KENALOG) 0.5 % Apply 1 application. topically 2 (two) times daily. 30 g 0   valACYclovir (VALTREX) 1000 MG tablet Take 2 tablets po q 12 hours for 2 doses. 30 tablet 2   [DISCONTINUED] clopidogrel (PLAVIX) 75 MG tablet Take 0.5 tablets (37.5 mg total) by mouth every other day. 15 tablet 2   No current facility-administered medications on file prior to visit.    Allergies  Allergen Reactions   Atorvastatin Hives and Itching    Social History   Socioeconomic History   Marital status: Single    Spouse name: Not on file   Number of children: 1   Years of education: BS   Highest education level: Not on file  Occupational History   Occupation: PHARMACIST    Employer: [employer]  Tobacco Use   Smoking status: Never    Passive exposure: Never   Smokeless tobacco: Never  Vaping Use   Vaping Use: Never used  Substance  and Sexual Activity   Alcohol use: No    Alcohol/week: 0.0 standard drinks of alcohol   Drug use: No   Sexual activity: Yes    Partners: Male  Other Topics Concern   Not on file  Social History Narrative   Not on file   Social Determinants of Health   Financial Resource Strain: Not on file  Food Insecurity: Not on file  Transportation Needs: Not on file  Physical Activity: Not on file  Stress: Not on file  Social Connections: Not on file  Intimate Partner Violence: Not on file    Family History  Problem Relation Age of Onset   Hypertension Mother    Liver disease Mother    Colon cancer Father 35   Throat  cancer Father 74   Heart disease Brother    Aneurysm Brother    Heart attack Brother    Asthma Son    Aneurysm Sister    Aneurysm Brother    Aneurysm Sister    Heart attack Sister    Aneurysm Sister 18   Stroke Maternal Grandmother    Breast cancer Paternal Aunt 71       metastatic   Breast cancer Paternal Aunt 68       metastatic   Colon polyps Brother    Breast cancer Cousin    Breast cancer Paternal Aunt     Past Surgical History:  Procedure Laterality Date   BREAST BIOPSY     age 73 left breast   CARDIOVASCULAR STRESS TEST  05/19/11   05/19/11 Nuclear stress test ([facility]): Normal study, EF 87%   CESAREAN SECTION  09/2005   CT CORONARY CA SCORING  06/23/12   06/23/12 Cardiac CTA ([facility]): Ca score 0. Right dominant. Normal coronaries.   INTRACRANIAL ANEURYSM REPAIR     MYOMECTOMY  2005   RADIOLOGY WITH ANESTHESIA N/A 11/21/2014   Procedure: Embolization;  Surgeon: Luanne Bras, MD;  Location: [place] [facility];  Service: Radiology;  Laterality: N/A;   US ECHOCARDIOGRAPHY  05/18/11   05/18/11 Echo ([place]): NL chamber size, LV systolic function, wall motion. LVEF 77%. Mild LVH. Mild MR/TR. NL PAP.    ROS: Review of Systems Negative except as stated above  PHYSICAL EXAM: BP 115/74 (BP Location: Left Arm, Patient Position: Sitting, Cuff Size: Normal)   Pulse 68   Temp 98.3 F (36.8 C)   Resp 16   Ht 5' 2.99" (1.6 m)   Wt 124 lb (56.2 kg)   LMP  (LMP Unknown)   SpO2 98%   BMI 21.97 kg/m   Physical Exam HENT:     Head: Normocephalic and atraumatic.  Eyes:     Extraocular Movements: Extraocular movements intact.     Conjunctiva/sclera: Conjunctivae normal.     Pupils: Pupils are equal, round, and reactive to light.  Cardiovascular:     Rate and Rhythm: Normal rate and regular rhythm.     Pulses: Normal pulses.     Heart sounds: Normal heart sounds.  Pulmonary:     Effort: Pulmonary effort is normal.     Breath sounds: Normal breath sounds.   Musculoskeletal:     Cervical back: Normal range of motion and neck supple.  Neurological:     General: No focal deficit present.     Mental Status: She is alert and oriented to person, place, and time.  Psychiatric:        Mood and Affect: Mood normal.  Behavior: Behavior normal.     ASSESSMENT AND PLAN: 1. Primary hypertension - Continue Atenolol and Enalapril as prescribed. - Counseled on blood pressure goal of less than 130/80, low-sodium, DASH diet, medication compliance, and 150 minutes of moderate intensity exercise per week as tolerated. Counseled on medication adherence and adverse effects. - Follow-up with primary provider in 3 months or sooner if needed.  - atenolol (TENORMIN) 50 MG tablet; Take 1 tablet (50 mg total) by mouth daily.  Dispense: 30 tablet; Refill: 2 - enalapril (VASOTEC) 5 MG tablet; Take 1 tablet (5 mg total) by mouth daily.  Dispense: 30 tablet; Refill: 2  2. Hyperlipidemia, unspecified hyperlipidemia type - Update lipid panel.  - Follow-up with primary provider as scheduled.  - Lipid panel  3. Need for immunization against influenza - Administered.  - Flu Vaccine QUAD 40moIM (Fluarix, Fluzone & Alfiuria Quad PF)   Patient was given the opportunity to ask questions.  Patient verbalized understanding of the plan and was able to repeat key elements of the plan. Patient was given clear instructions to go to Emergency Department or return to medical center if symptoms don't improve, worsen, or new problems develop.The patient verbalized understanding.   Orders Placed This Encounter  Procedures   Flu Vaccine QUAD 657moM (Fluarix, Fluzone & Alfiuria Quad PF)   Lipid panel     Requested Prescriptions   Signed Prescriptions Disp Refills   atenolol (TENORMIN) 50 MG tablet 30 tablet 2    Sig: Take 1 tablet (50 mg total) by mouth daily.   enalapril (VASOTEC) 5 MG tablet 30 tablet 2    Sig: Take 1 tablet (5 mg total) by mouth daily.    Return  in about 3 months (around 05/07/2022) for Follow-Up or next available chronic care mgmt.  AmCamillia HerterNP

## 2022-02-04 ENCOUNTER — Ambulatory Visit: Payer: 59 | Admitting: Family

## 2022-02-04 ENCOUNTER — Encounter: Payer: Self-pay | Admitting: Family

## 2022-02-04 VITALS — BP 115/74 | HR 68 | Temp 98.3°F | Resp 16 | Ht 62.99 in | Wt 124.0 lb

## 2022-02-04 DIAGNOSIS — E785 Hyperlipidemia, unspecified: Secondary | ICD-10-CM | POA: Diagnosis not present

## 2022-02-04 DIAGNOSIS — Z23 Encounter for immunization: Secondary | ICD-10-CM | POA: Diagnosis not present

## 2022-02-04 DIAGNOSIS — I10 Essential (primary) hypertension: Secondary | ICD-10-CM

## 2022-02-04 MED ORDER — ENALAPRIL MALEATE 5 MG PO TABS: 5.0000 mg | ORAL_TABLET | Freq: Every day | ORAL | 2 refills | Status: DC

## 2022-02-04 MED ORDER — ATENOLOL 50 MG PO TABS: 50.0000 mg | ORAL_TABLET | Freq: Every day | ORAL | 2 refills | Status: DC

## 2022-02-04 NOTE — Patient Instructions (Signed)
Atenolol Tablets What is this medication? ATENOLOL (a TEN oh lole) treats high blood pressure. It also prevents chest pain (angina) or further damage after a heart attack. It works by lowering your blood pressure and heart rate, making it easier for your heart to pump blood to the rest of your body. It belongs to a group of medications called beta blockers. This medicine may be used for other purposes; ask your health care provider or pharmacist if you have questions. COMMON BRAND NAME(S): Tenormin What should I tell my care team before I take this medication? They need to know if you have any of these conditions: Heart or blood vessel disease like slow heart rate, worsening heart failure, heart block, sick sinus syndrome or Raynaud's disease High blood sugar (diabetes) Kidney disease Lung or breathing disease, like asthma or emphysema Pheochromocytoma Thyroid disease An unusual or allergic reaction to atenolol, other beta-blockers, medications, foods, dyes, or preservatives Pregnant or trying to get pregnant Breast-feeding How should I use this medication? Take this medication by mouth. Take it as directed on the prescription label at the same time every day. You can take it with or without food. If it upsets your stomach, take it with food. Keep taking it unless your care team tells you to stop. Talk to your care team about the use of this medication in children. Special care may be needed. Overdosage: If you think you have taken too much of this medicine contact a poison control center or emergency room at once. NOTE: This medicine is only for you. Do not share this medicine with others. What if I miss a dose? If you miss a dose, take it as soon as you can. If it is almost time for your next dose, take only that dose. Do not take double or extra doses. What may interact with this medication? This medication may interact with the following: Certain medications for blood pressure, heart  disease, irregular heart beat Clonidine Digoxin Diuretics Dobutamine Epinephrine Isoproterenol NSAIDs, medications for pain and inflammation, like ibuprofen or naproxen Reserpine This list may not describe all possible interactions. Give your health care provider a list of all the medicines, herbs, non-prescription drugs, or dietary supplements you use. Also tell them if you smoke, drink alcohol, or use illegal drugs. Some items may interact with your medicine. What should I watch for while using this medication? Visit your care team for regular checks on your progress. Check your blood pressure as directed. Ask your care team what your blood pressure should be. Also, find out when you should contact them. Do not treat yourself for coughs, colds, or pain while you are using this medication without asking your care team for advice. Some medications may increase your blood pressure. You may get drowsy or dizzy. Do not drive, use machinery, or do anything that needs mental alertness until you know how this medication affects you. Do not stand up or sit up quickly, especially if you are an older patient. This reduces the risk of dizzy or fainting spells. Alcohol may interfere with the effect of this medication. Avoid alcoholic drinks. This medication may increase blood sugar. Ask your care team if changes in diet or medications are needed if you have diabetes. What side effects may I notice from receiving this medication? Side effects that you should report to your care team as soon as possible: Allergic reactions--skin rash, itching, hives, swelling of the face, lips, tongue, or throat Heart failure--shortness of breath, swelling of the ankles,  feet, or hands, sudden weight gain, unusual weakness or fatigue Low blood pressure--dizziness, feeling faint or lightheaded, blurry vision Raynaud's--cool, numb, or painful fingers or toes that may change color from pale, to blue, to red Slow  heartbeat--dizziness, feeling faint or lightheaded, confusion, trouble breathing, unusual weakness or fatigue Worsening mood or feelings of depression Side effects that usually do not require medical attention (report to your care team if they continue or are bothersome): Diarrhea Fatigue Nausea Vivid dreams or nightmares This list may not describe all possible side effects. Call your doctor for medical advice about side effects. You may report side effects to FDA at 1-800-FDA-1088. Where should I keep my medication? Keep out of the reach of children and pets. Store at room temperature between 20 and 25 degrees C (68 and 77 degrees F). Throw away any unused medication after the expiration date. NOTE: This sheet is a summary. It may not cover all possible information. If you have questions about this medicine, talk to your doctor, pharmacist, or health care provider.  2023 Elsevier/Gold Standard (2020-03-21 00:00:00)

## 2022-02-04 NOTE — Progress Notes (Signed)
.  Pt presents for chronic care management   -flu vaccine administered

## 2022-02-05 ENCOUNTER — Other Ambulatory Visit: Payer: Self-pay | Admitting: Family

## 2022-02-05 LAB — LIPID PANEL
Chol/HDL Ratio: 2.9 ratio (ref 0.0–4.4)
Cholesterol, Total: 226 mg/dL — ABNORMAL HIGH (ref 100–199)
HDL: 78 mg/dL (ref >39)
LDL Chol Calc (NIH): 135 mg/dL — ABNORMAL HIGH (ref 0–99)
Triglycerides: 74 mg/dL (ref 0–149)
VLDL Cholesterol Cal: 13 mg/dL (ref 5–40)

## 2022-02-08 ENCOUNTER — Telehealth (HOSPITAL_COMMUNITY): Payer: Self-pay

## 2022-02-08 NOTE — Telephone Encounter (Signed)
Called to let pt know that we are waiting on insurance approval for her MRA. Will call to schedule once approved. AW

## 2022-02-15 NOTE — Telephone Encounter (Signed)
Noted  

## 2022-02-25 ENCOUNTER — Telehealth (HOSPITAL_COMMUNITY): Payer: Self-pay

## 2022-02-25 ENCOUNTER — Other Ambulatory Visit (HOSPITAL_COMMUNITY): Payer: Self-pay | Admitting: Interventional Radiology

## 2022-02-25 DIAGNOSIS — I671 Cerebral aneurysm, nonruptured: Secondary | ICD-10-CM

## 2022-02-25 NOTE — Telephone Encounter (Signed)
Called to schedule mra, no answer, left vm. AW  ?

## 2022-03-10 ENCOUNTER — Ambulatory Visit (HOSPITAL_COMMUNITY)
Admission: RE | Admit: 2022-03-10 | Discharge: 2022-03-10 | Disposition: A | Discharge status: Home / Self Care | Payer: 59 | Source: Ambulatory Visit | Attending: Interventional Radiology | Admitting: Interventional Radiology

## 2022-03-10 DIAGNOSIS — I671 Cerebral aneurysm, nonruptured: Secondary | ICD-10-CM | POA: Diagnosis present

## 2022-03-16 ENCOUNTER — Telehealth (HOSPITAL_COMMUNITY): Payer: Self-pay

## 2022-03-16 NOTE — Telephone Encounter (Signed)
Called pt regarding recent imaging, no answer, left vm. AW  

## 2022-03-18 ENCOUNTER — Telehealth (HOSPITAL_COMMUNITY): Payer: Self-pay

## 2022-03-18 NOTE — Telephone Encounter (Signed)
Pt agreed to f/u in 2 years with an mra. AW  

## 2022-04-28 NOTE — Progress Notes (Deleted)
52 y.o. G1P1 Single Black or African American Not Hispanic or Latino female here for annual exam.      No LMP recorded (lmp unknown). Patient is postmenopausal.          Sexually active: {yes no:314532}  The current method of family planning is {contraception:315051}.    Exercising: {yes no:314532}  {types:19826} Smoker:  {YES P5382123  Health Maintenance: Pap: 04/24/20 WNL Hr HPV Neg; 03/09/16 Neg:Neg HR HPV History of abnormal Pap:  yes f/u was normal  MMG:  MR Breast 01/05/21 density D Bi-rads 1 neg  BMD:   *** Colonoscopy: *** TDaP:  *** Gardasil: ***   reports that she has never smoked. She has never been exposed to tobacco smoke. She has never used smokeless tobacco. She reports that she does not drink alcohol and does not use drugs.  Past Medical History:  Diagnosis Date   Abnormal pap    Cerebral aneurysm without rupture    Chest pain radiating to arm    Family history of breast cancer    Family history of colon cancer    Family history of heart disease    Gastroesophageal reflux    Gestational diabetes    Hyperlipidemia    Hypertension    Mitral valve regurgitation    PONV (postoperative nausea and vomiting)     Past Surgical History:  Procedure Laterality Date   BREAST BIOPSY     age 72 left breast   CARDIOVASCULAR STRESS TEST  05/19/11   05/19/11 Nuclear stress test ([facility]): Normal study, EF 87%   CESAREAN SECTION  09/2005   CT CORONARY CA SCORING  06/23/12   06/23/12 Cardiac CTA ([facility]): Ca score 0. Right dominant. Normal coronaries.   INTRACRANIAL ANEURYSM REPAIR     MYOMECTOMY  2005   RADIOLOGY WITH ANESTHESIA N/A 11/21/2014   Procedure: Embolization;  Surgeon: Luanne Bras, MD;  Location: [facility];  Service: Radiology;  Laterality: N/A;   US ECHOCARDIOGRAPHY  05/18/11   05/18/11 Echo ([facility]): NL chamber size, LV systolic function, wall motion. LVEF 77%. Mild LVH. Mild MR/TR. NL PAP.    Current Outpatient Medications   Medication Sig Dispense Refill   albuterol (PROVENTIL) (2.5 MG/3ML) 0.083% nebulizer solution Take 3 mLs (2.5 mg total) by nebulization every 6 (six) hours as needed for wheezing or shortness of breath. 150 mL 2   albuterol (VENTOLIN HFA) 108 (90 Base) MCG/ACT inhaler Inhale 1-2 puffs into the lungs every 6 (six) hours as needed for wheezing or shortness of breath. 18 g 1   aspirin 81 MG tablet Take 81 mg by mouth daily.     atenolol (TENORMIN) 50 MG tablet Take 1 tablet (50 mg total) by mouth daily. 30 tablet 2   benzonatate (TESSALON) 100 MG capsule Take 1 capsule (100 mg total) by mouth 3 (three) times daily as needed for cough. 30 capsule 2   Cetirizine HCl 10 MG CAPS Take 1 capsule (10 mg total) by mouth daily. 15 capsule 0   clopidogrel (PLAVIX) 75 MG tablet Take 0.5 tablets (37.5 mg total) by mouth every other day. 15 tablet 6   enalapril (VASOTEC) 5 MG tablet Take 1 tablet (5 mg total) by mouth daily. 30 tablet 2   famotidine (PEPCID) 40 MG tablet Take 1 tablet (40 mg total) by mouth at bedtime. 30 tablet 0   rosuvastatin (CRESTOR) 10 MG tablet Take 1 tablet (10 mg total) by mouth daily. 90 tablet 3   sucralfate (CARAFATE) 1 GM/10ML  suspension Take 10 mLs (1 g total) by mouth in the morning, at noon, and at bedtime. 414 mL 2   triamcinolone cream (KENALOG) 0.5 % Apply 1 application. topically 2 (two) times daily. 30 g 0   valACYclovir (VALTREX) 1000 MG tablet Take 2 tablets po q 12 hours for 2 doses. 30 tablet 2   No current facility-administered medications for this visit.    Family History  Problem Relation Age of Onset   Hypertension Mother    Liver disease Mother    Colon cancer Father 69   Throat cancer Father 80   Heart disease Brother    Aneurysm Brother    Heart attack Brother    Asthma Son    Aneurysm Sister    Aneurysm Brother    Aneurysm Sister    Heart attack Sister    Aneurysm Sister 18   Stroke Maternal Grandmother    Breast cancer Paternal Aunt 10        metastatic   Breast cancer Paternal Aunt 81       metastatic   Colon polyps Brother    Breast cancer Cousin    Breast cancer Paternal Aunt     Review of Systems  Exam:   LMP  (LMP Unknown)   Weight change: '@WEIGHTCHANGE'$ @ Height:      Ht Readings from Last 3 Encounters:  02/04/22 5' 2.99" (1.6 m)  01/20/22 '5\' 3"'$  (1.6 m)  09/03/21 '5\' 3"'$  (1.6 m)    General appearance: alert, cooperative and appears stated age Head: Normocephalic, without obvious abnormality, atraumatic Neck: no adenopathy, supple, symmetrical, trachea midline and thyroid {CHL AMB PHY EX THYROID NORM DEFAULT:[phone]::"normal to inspection and palpation"} Lungs: clear to auscultation bilaterally Cardiovascular: regular rate and rhythm Breasts: {Exam; breast:13139::"normal appearance, no masses or tenderness"} Abdomen: soft, non-tender; non distended,  no masses,  no organomegaly Extremities: extremities normal, atraumatic, no cyanosis or edema Skin: Skin color, texture, turgor normal. No rashes or lesions Lymph nodes: Cervical, supraclavicular, and axillary nodes normal. No abnormal inguinal nodes palpated Neurologic: Grossly normal   Pelvic: External genitalia:  no lesions              Urethra:  normal appearing urethra with no masses, tenderness or lesions              Bartholins and Skenes: normal                 Vagina: normal appearing vagina with normal color and discharge, no lesions              Cervix: {CHL AMB PHY EX CERVIX NORM [phone]::"no lesions"}               Bimanual Exam:  Uterus:  {CHL AMB PHY EX UTERUS NORM DEFAULT:[id number]::"normal size, contour, position, consistency, mobility, non-tender"}              Adnexa: {CHL AMB PHY EX ADNEXA NO MASS DEFAULT:[id number]::"no mass, fullness, tenderness"}               Rectovaginal: Confirms               Anus:  normal sphincter tone, no lesions  *** chaperoned for the exam.  A:  Well Woman with normal exam  P:

## 2022-05-04 NOTE — Progress Notes (Deleted)
Patient ID: Sharon Mendoza, female    DOB: Mar 02, 1971  MRN: [id number]  CC: Chronic Care Management  Subjective: Sharon Mendoza is a 52 y.o. female who presents for chronic care management.   Her concerns today include:  HTN - Atenolol, Enalapril, needs annual appt last 05/08/2020 HLD - Rosuvastatin   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 04/30/2021   Family history of coronary artery disease 05/08/2020   Dyslipidemia 04/24/2020   Gastroesophageal reflux disease without esophagitis 04/24/2020   Pain in right knee 04/24/2020   History of cerebral aneurysm 04/17/2018   Genetic testing 06/08/2017   Family history of colon cancer    Family history of breast cancer    Essential hypertension 02/18/2015   Hyperlipidemia 02/18/2015   Chest pain 02/18/2015   Brain aneurysm 11/21/2014   Pain in joint, shoulder region 12/10/2013   LBP radiating to left leg 12/10/2013   Aneurysm, cerebral, nonruptured 12/10/2013   Fibroid uterus 06/22/2012   Menorrhagia 06/22/2012   Dysmenorrhea 06/22/2012     Current Outpatient Medications on File Prior to Visit  Medication Sig Dispense Refill   albuterol (PROVENTIL) (2.5 MG/3ML) 0.083% nebulizer solution Take 3 mLs (2.5 mg total) by nebulization every 6 (six) hours as needed for wheezing or shortness of breath. 150 mL 2   albuterol (VENTOLIN HFA) 108 (90 Base) MCG/ACT inhaler Inhale 1-2 puffs into the lungs every 6 (six) hours as needed for wheezing or shortness of breath. 18 g 1   aspirin 81 MG tablet Take 81 mg by mouth daily.     atenolol (TENORMIN) 50 MG tablet Take 1 tablet (50 mg total) by mouth daily. 30 tablet 2   benzonatate (TESSALON) 100 MG capsule Take 1 capsule (100 mg total) by mouth 3 (three) times daily as needed for cough. 30 capsule 2   Cetirizine HCl 10 MG CAPS Take 1 capsule (10 mg total)  by mouth daily. 15 capsule 0   clopidogrel (PLAVIX) 75 MG tablet Take 0.5 tablets (37.5 mg total) by mouth every other day. 15 tablet 6   enalapril (VASOTEC) 5 MG tablet Take 1 tablet (5 mg total) by mouth daily. 30 tablet 2   famotidine (PEPCID) 40 MG tablet Take 1 tablet (40 mg total) by mouth at bedtime. 30 tablet 0   rosuvastatin (CRESTOR) 10 MG tablet Take 1 tablet (10 mg total) by mouth daily. 90 tablet 3   sucralfate (CARAFATE) 1 GM/10ML suspension Take 10 mLs (1 g total) by mouth in the morning, at noon, and at bedtime. 414 mL 2   triamcinolone cream (KENALOG) 0.5 % Apply 1 application. topically 2 (two) times daily. 30 g 0   valACYclovir (VALTREX) 1000 MG tablet Take 2 tablets po q 12 hours for 2 doses. 30 tablet 2   [DISCONTINUED] clopidogrel (PLAVIX) 75 MG tablet Take 0.5 tablets (37.5 mg total) by mouth every other day. 15 tablet 2   No current facility-administered medications on file prior to visit.    Allergies  Allergen Reactions   Atorvastatin Hives and Itching    Social History   Socioeconomic History   Marital status: Single    Spouse name: Not on file   Number of children: 1   Years of education: BS   Highest education level: Not on file  Occupational History   Occupation: PHARMACIST    Employer: [employer]  Tobacco Use   Smoking status: Never    Passive exposure: Never   Smokeless tobacco: Never  Vaping Use   Vaping Use: Never used  Substance and Sexual Activity   Alcohol use: No    Alcohol/week: 0.0 standard drinks of alcohol   Drug use: No   Sexual activity: Yes    Partners: Male  Other Topics Concern   Not on file  Social History Narrative   Not on file   Social Determinants of Health   Financial Resource Strain: Not on file  Food Insecurity: Not on file  Transportation Needs: Not on file  Physical Activity: Not on file  Stress: Not on file  Social Connections: Not on file  Intimate Partner Violence: Not on file    Family History   Problem Relation Age of Onset   Hypertension Mother    Liver disease Mother    Colon cancer Father 31   Throat cancer Father 3   Heart disease Brother    Aneurysm Brother    Heart attack Brother    Asthma Son    Aneurysm Sister    Aneurysm Brother    Aneurysm Sister    Heart attack Sister    Aneurysm Sister 18   Stroke Maternal Grandmother    Breast cancer Paternal Aunt 28       metastatic   Breast cancer Paternal Aunt 77       metastatic   Colon polyps Brother    Breast cancer Cousin    Breast cancer Paternal Aunt     Past Surgical History:  Procedure Laterality Date   BREAST BIOPSY     age 31 left breast   CARDIOVASCULAR STRESS TEST  05/19/11   05/19/11 Nuclear stress test ([facility]): Normal study, EF 87%   CESAREAN SECTION  09/2005   CT CORONARY CA SCORING  06/23/12   06/23/12 Cardiac CTA ([facility]): Ca score 0. Right dominant. Normal coronaries.   INTRACRANIAL ANEURYSM REPAIR     MYOMECTOMY  2005   RADIOLOGY WITH ANESTHESIA N/A 11/21/2014   Procedure: Embolization;  Surgeon: Luanne Bras, MD;  Location: [place];  Service: Radiology;  Laterality: N/A;   US ECHOCARDIOGRAPHY  05/18/11   05/18/11 Echo ([place]): NL chamber size, LV systolic function, wall motion. LVEF 77%. Mild LVH. Mild MR/TR. NL PAP.    ROS: Review of Systems Negative except as stated above  PHYSICAL EXAM: LMP  (LMP Unknown)   Physical Exam  {female adult master:310786} {female adult master:310785}     Latest Ref Rng & Units 10/01/2021   11:48 AM 04/30/2021    9:42 AM 08/07/2020   12:00 AM  CMP  Glucose 70 - 99 mg/dL 81  82  74   BUN 6 - 24 mg/dL 17  17  14   $ Creatinine 0.57 - 1.00 mg/dL 0.83  0.87  0.80   Sodium 134 - 144 mmol/L 140  140  141   Potassium 3.5 - 5.2 mmol/L 4.9  4.5  4.9   Chloride 96 - 106 mmol/L 104  105  102   CO2 20 - 29 mmol/L 25  30  27   $ Calcium 8.7 - 10.2 mg/dL 10.0  9.7  10.2   Total Protein 6.1 - 8.1 g/dL  6.7  7.0   Total Bilirubin 0.2 -  1.2 mg/dL  0.4  0.3   Alkaline Phos 44 - 121 IU/L   97   AST 10 - 35 U/L  20  16   ALT 6 - 29 U/L  14  7    Lipid Panel     Component Value Date/Time   CHOL 226 (H) 02/04/2022 1048   TRIG 74 02/04/2022 1048   HDL 78 02/04/2022 1048   CHOLHDL 2.9 02/04/2022 1048   CHOLHDL 3.1 04/30/2021 0942   VLDL 15 12/16/2015 0851   LDLCALC 135 (H) 02/04/2022 1048   LDLCALC 129 (H) 04/30/2021 0942    CBC    Component Value Date/Time   WBC 4.2 04/30/2021 0942   RBC 4.46 04/30/2021 0942   HGB 12.9 04/30/2021 0942   HGB 13.5 04/18/2019 0938   HGB 12.7 11/28/2013 0810   HCT 40.2 04/30/2021 0942   HCT 41.5 04/18/2019 0938   PLT 219 04/30/2021 0942   PLT 224 04/18/2019 0938   MCV 90.1 04/30/2021 0942   MCV 90 04/18/2019 0938   MCH 28.9 04/30/2021 0942   MCHC 32.1 04/30/2021 0942   RDW 13.3 04/30/2021 0942   RDW 13.3 04/18/2019 0938   LYMPHSABS 2.4 11/22/2014 0450   MONOABS 0.8 11/22/2014 0450   EOSABS 0.0 11/22/2014 0450   BASOSABS 0.0 11/22/2014 0450    ASSESSMENT AND PLAN:  There are no diagnoses linked to this encounter.   Patient was given the opportunity to ask questions.  Patient verbalized understanding of the plan and was able to repeat key elements of the plan. Patient was given clear instructions to go to Emergency Department or return to medical center if symptoms don't improve, worsen, or new problems develop.The patient verbalized understanding.   No orders of the defined types were placed in this encounter.    Requested Prescriptions    No prescriptions requested or ordered in this encounter    No follow-ups on file.  Camillia Herter, NP

## 2022-05-05 ENCOUNTER — Telehealth: Payer: Self-pay | Admitting: Family

## 2022-05-05 ENCOUNTER — Other Ambulatory Visit (HOSPITAL_BASED_OUTPATIENT_CLINIC_OR_DEPARTMENT_OTHER): Payer: Self-pay

## 2022-05-05 ENCOUNTER — Ambulatory Visit: Payer: 59 | Admitting: Obstetrics and Gynecology

## 2022-05-05 ENCOUNTER — Encounter: Payer: Self-pay | Admitting: Obstetrics and Gynecology

## 2022-05-05 ENCOUNTER — Telehealth: Payer: 59 | Admitting: Nurse Practitioner

## 2022-05-05 DIAGNOSIS — U071 COVID-19: Secondary | ICD-10-CM | POA: Diagnosis not present

## 2022-05-05 MED ORDER — MOLNUPIRAVIR EUA 200MG CAPSULE: 4.0000 | ORAL_CAPSULE | Freq: Two times a day (BID) | ORAL | 0 refills | Status: DC

## 2022-05-05 MED ORDER — MOLNUPIRAVIR EUA 200MG CAPSULE
4.0000 | ORAL_CAPSULE | Freq: Two times a day (BID) | ORAL | 0 refills | Status: AC
  Filled 2022-05-05: qty 40, 5d supply, fill #0

## 2022-05-05 NOTE — Telephone Encounter (Signed)
Pharmacy calling to let provider know that they do not have  molnupiravir EUA (LAGEVRIO) 200 mg CAPS capsule

## 2022-05-05 NOTE — Patient Instructions (Signed)
Ceasar Mons, thank you for joining Chevis Pretty, FNP for today's virtual visit.  While this provider is not your primary care provider (PCP), if your PCP is located in our provider database this encounter information will be shared with them immediately following your visit.   [facility] account gives you access to today's visit and all your visits, tests, and labs performed at [facility] " click here if you don't have a [facility] account or go to [email or web address]  Consent: (Patient) Nelly Craun provided verbal consent for this virtual visit at the beginning of the encounter.  Current Medications:  Current Outpatient Medications:    albuterol (PROVENTIL) (2.5 MG/3ML) 0.083% nebulizer solution, Take 3 mLs (2.5 mg total) by nebulization every 6 (six) hours as needed for wheezing or shortness of breath., Disp: 150 mL, Rfl: 2   albuterol (VENTOLIN HFA) 108 (90 Base) MCG/ACT inhaler, Inhale 1-2 puffs into the lungs every 6 (six) hours as needed for wheezing or shortness of breath., Disp: 18 g, Rfl: 1   aspirin 81 MG tablet, Take 81 mg by mouth daily., Disp: , Rfl:    atenolol (TENORMIN) 50 MG tablet, Take 1 tablet (50 mg total) by mouth daily., Disp: 30 tablet, Rfl: 2   benzonatate (TESSALON) 100 MG capsule, Take 1 capsule (100 mg total) by mouth 3 (three) times daily as needed for cough., Disp: 30 capsule, Rfl: 2   Cetirizine HCl 10 MG CAPS, Take 1 capsule (10 mg total) by mouth daily., Disp: 15 capsule, Rfl: 0   clopidogrel (PLAVIX) 75 MG tablet, Take 0.5 tablets (37.5 mg total) by mouth every other day., Disp: 15 tablet, Rfl: 6   enalapril (VASOTEC) 5 MG tablet, Take 1 tablet (5 mg total) by mouth daily., Disp: 30 tablet, Rfl: 2   famotidine (PEPCID) 40 MG tablet, Take 1 tablet (40 mg total) by mouth at bedtime., Disp: 30 tablet, Rfl: 0   molnupiravir EUA (LAGEVRIO) 200 mg CAPS capsule, Take 4 capsules (800 mg total) by mouth 2 (two) times  daily for 5 days., Disp: 40 capsule, Rfl: 0   rosuvastatin (CRESTOR) 10 MG tablet, Take 1 tablet (10 mg total) by mouth daily., Disp: 90 tablet, Rfl: 3   sucralfate (CARAFATE) 1 GM/10ML suspension, Take 10 mLs (1 g total) by mouth in the morning, at noon, and at bedtime., Disp: 414 mL, Rfl: 2   triamcinolone cream (KENALOG) 0.5 %, Apply 1 application. topically 2 (two) times daily., Disp: 30 g, Rfl: 0   valACYclovir (VALTREX) 1000 MG tablet, Take 2 tablets po q 12 hours for 2 doses., Disp: 30 tablet, Rfl: 2   Medications ordered in this encounter:  Meds ordered this encounter  Medications   DISCONTD: molnupiravir EUA (LAGEVRIO) 200 mg CAPS capsule    Sig: Take 4 capsules (800 mg total) by mouth 2 (two) times daily for 5 days.    Dispense:  40 capsule    Refill:  0    Order Specific Question:   Supervising Provider    Answer:   Chase Picket [id number]   molnupiravir EUA (LAGEVRIO) 200 mg CAPS capsule    Sig: Take 4 capsules (800 mg total) by mouth 2 (two) times daily for 5 days.    Dispense:  40 capsule    Refill:  0    Order Specific Question:   Supervising Provider    Answer:   Chase Picket [id number]     *If you need refills on other medications prior  to your next appointment, please contact your pharmacy*  Follow-Up: Call back or seek an in-person evaluation if the symptoms worsen or if the condition fails to improve as anticipated.  [place] [facility] [phone]  Other Instructions 1. Take meds as prescribed 2. Use a cool mist humidifier especially during the winter months and when heat has been humid. 3. Use saline nose sprays frequently 4. Saline irrigations of the nose can be very helpful if done frequently.  * 4X daily for 1 week*  * Use of a nettie pot can be helpful with this. Follow directions with this* 5. Drink plenty of fluids 6. Keep thermostat turn down low 7.For any cough or congestion- delsym 8. For fever or aces or pains- take tylenol or  ibuprofen appropriate for age and weight.  * for fevers greater than 101 orally you may alternate ibuprofen and tylenol every  3 hours.      If you have been instructed to have an in-person evaluation today at a local Urgent Care facility, please use the link below. It will take you to a list of all of our available [facility], including address, phone number and hours of operation. Please do not delay care.  [facility]  If you or a family member do not have a primary care provider, use the link below to schedule a visit and establish care. When you choose a [facility] primary care physician or advanced practice provider, you gain a long-term partner in health. Find a Primary Care Provider  Learn more about [facility]'s in-office and virtual care options: [facility]

## 2022-05-05 NOTE — Progress Notes (Signed)
Virtual Visit Consent   Sharon Mendoza, you are scheduled for a virtual visit with Sharon Mendoza, [place], a [facility] provider, today.     Just as with appointments in the office, your consent must be obtained to participate.  Your consent will be active for this visit and any virtual visit you may have with one of our providers in the next 365 days.     If you have a MyChart account, a copy of this consent can be sent to you electronically.  All virtual visits are billed to your insurance company just like a traditional visit in the office.    As this is a virtual visit, video technology does not allow for your provider to perform a traditional examination.  This may limit your provider's ability to fully assess your condition.  If your provider identifies any concerns that need to be evaluated in person or the need to arrange testing (such as labs, EKG, etc.), we will make arrangements to do so.     Although advances in technology are sophisticated, we cannot ensure that it will always work on either your end or our end.  If the connection with a video visit is poor, the visit may have to be switched to a telephone visit.  With either a video or telephone visit, we are not always able to ensure that we have a secure connection.     I need to obtain your verbal consent now.   Are you willing to proceed with your visit today? YES   Sharon Mendoza has provided verbal consent on 05/05/2022 for a virtual visit (video or telephone).   Sharon Hassell Done, FNP   Date: 05/05/2022 1:42 PM   Virtual Visit via Video Note   I, Sharon Mendoza, connected with Sharon Mendoza ([id number], Sep 03, 1970) on 05/05/22 at  2:15 PM EST by a video-enabled telemedicine application and verified that I am speaking with the correct person using two identifiers.  Location: Patient: Virtual Visit Location Patient: Home Provider: Virtual Visit Location Provider: Mobile   I discussed the limitations  of evaluation and management by telemedicine and the availability of in person appointments. The patient expressed understanding and agreed to proceed.    History of Present Illness: Sharon Mendoza is a 52 y.o. who identifies as a female who was assigned female at birth, and is being seen today for covid .  HPI: URI  This is a new problem. The current episode started today. The problem has been gradually worsening. There has been no fever. Associated symptoms include congestion, coughing, headaches, rhinorrhea and a sore throat. She has tried nothing for the symptoms. The treatment provided no relief.    Review of Systems  Constitutional:  Negative for chills and fever.  HENT:  Positive for congestion, rhinorrhea and sore throat.   Respiratory:  Positive for cough.   Neurological:  Positive for headaches.    Problems:  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 04/30/2021   Family history of coronary artery disease 05/08/2020   Dyslipidemia 04/24/2020   Gastroesophageal reflux disease without esophagitis 04/24/2020   Pain in right knee 04/24/2020   History of cerebral aneurysm 04/17/2018   Genetic testing 06/08/2017   Family history of colon cancer    Family history of breast cancer    Essential hypertension 02/18/2015   Hyperlipidemia 02/18/2015   Chest  pain 02/18/2015   Brain aneurysm 11/21/2014   Pain in joint, shoulder region 12/10/2013   LBP radiating to left leg 12/10/2013   Aneurysm, cerebral, nonruptured 12/10/2013   Fibroid uterus 06/22/2012   Menorrhagia 06/22/2012   Dysmenorrhea 06/22/2012    Allergies:  Allergies  Allergen Reactions   Atorvastatin Hives and Itching   Medications:  Current Outpatient Medications:    albuterol (PROVENTIL) (2.5 MG/3ML) 0.083% nebulizer solution, Take 3 mLs (2.5 mg total) by nebulization every 6  (six) hours as needed for wheezing or shortness of breath., Disp: 150 mL, Rfl: 2   albuterol (VENTOLIN HFA) 108 (90 Base) MCG/ACT inhaler, Inhale 1-2 puffs into the lungs every 6 (six) hours as needed for wheezing or shortness of breath., Disp: 18 g, Rfl: 1   aspirin 81 MG tablet, Take 81 mg by mouth daily., Disp: , Rfl:    atenolol (TENORMIN) 50 MG tablet, Take 1 tablet (50 mg total) by mouth daily., Disp: 30 tablet, Rfl: 2   benzonatate (TESSALON) 100 MG capsule, Take 1 capsule (100 mg total) by mouth 3 (three) times daily as needed for cough., Disp: 30 capsule, Rfl: 2   Cetirizine HCl 10 MG CAPS, Take 1 capsule (10 mg total) by mouth daily., Disp: 15 capsule, Rfl: 0   clopidogrel (PLAVIX) 75 MG tablet, Take 0.5 tablets (37.5 mg total) by mouth every other day., Disp: 15 tablet, Rfl: 6   enalapril (VASOTEC) 5 MG tablet, Take 1 tablet (5 mg total) by mouth daily., Disp: 30 tablet, Rfl: 2   famotidine (PEPCID) 40 MG tablet, Take 1 tablet (40 mg total) by mouth at bedtime., Disp: 30 tablet, Rfl: 0   rosuvastatin (CRESTOR) 10 MG tablet, Take 1 tablet (10 mg total) by mouth daily., Disp: 90 tablet, Rfl: 3   sucralfate (CARAFATE) 1 GM/10ML suspension, Take 10 mLs (1 g total) by mouth in the morning, at noon, and at bedtime., Disp: 414 mL, Rfl: 2   triamcinolone cream (KENALOG) 0.5 %, Apply 1 application. topically 2 (two) times daily., Disp: 30 g, Rfl: 0   valACYclovir (VALTREX) 1000 MG tablet, Take 2 tablets po q 12 hours for 2 doses., Disp: 30 tablet, Rfl: 2  Observations/Objective: Patient is well-developed, well-nourished in no acute distress.  Resting comfortably  at home.  Head is normocephalic, atraumatic.  No labored breathing.  Speech is clear and coherent with logical content.  Patient is alert and oriented at baseline.  Raspy voice No cough  Assessment and Plan:  Sharon Mendoza in today with chief complaint of Covid Positive   1. Positive self-administered antigen test for  COVID-19 1. Take meds as prescribed 2. Use a cool mist humidifier especially during the winter months and when heat has been humid. 3. Use saline nose sprays frequently 4. Saline irrigations of the nose can be very helpful if Mendoza frequently.  * 4X daily for 1 week*  * Use of a nettie pot can be helpful with this. Follow directions with this* 5. Drink plenty of fluids 6. Keep thermostat turn down low 7.For any cough or congestion- delsym OTC 8. For fever or aces or pains- take tylenol or ibuprofen appropriate for age and weight.  * for fevers greater than 101 orally you may alternate ibuprofen and tylenol every  3 hours.    Meds ordered this encounter  Medications   DISCONTD: molnupiravir EUA (LAGEVRIO) 200 mg CAPS capsule    Sig: Take 4 capsules (800 mg total) by mouth 2 (two) times daily for  5 days.    Dispense:  40 capsule    Refill:  0    Order Specific Question:   Supervising Provider    Answer:   Chase Picket [id number]   molnupiravir EUA (LAGEVRIO) 200 mg CAPS capsule    Sig: Take 4 capsules (800 mg total) by mouth 2 (two) times daily for 5 days.    Dispense:  40 capsule    Refill:  0    Order Specific Question:   Supervising Provider    Answer:   Chase Picket [id number]      Follow Up Instructions: I discussed the assessment and treatment plan with the patient. The patient was provided an opportunity to ask questions and all were answered. The patient agreed with the plan and demonstrated an understanding of the instructions.  A copy of instructions were sent to the patient via MyChart.  The patient was advised to call back or seek an in-person evaluation if the symptoms worsen or if the condition fails to improve as anticipated.  Time:  I spent 7 minutes with the patient via telehealth technology discussing the above problems/concerns.    Sharon Hassell Done, FNP

## 2022-05-06 ENCOUNTER — Ambulatory Visit: Payer: 59 | Admitting: Family

## 2022-05-06 DIAGNOSIS — E785 Hyperlipidemia, unspecified: Secondary | ICD-10-CM

## 2022-05-06 DIAGNOSIS — Z131 Encounter for screening for diabetes mellitus: Secondary | ICD-10-CM

## 2022-05-06 DIAGNOSIS — I10 Essential (primary) hypertension: Secondary | ICD-10-CM

## 2022-05-19 NOTE — Progress Notes (Unsigned)
Patient ID: Sharon Mendoza, female    DOB: [birth year]  MRN: [id number]  CC: Chronic Care Management  Subjective: Sharon Mendoza is a 52 y.o. female who presents for chronic care management.   Her concerns today include:  HTN - Atenolol, Enalapril, needs annual appt last 05/08/2020 HLD - Rosuvastatin   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 04/30/2021   Family history of coronary artery disease 05/08/2020   Dyslipidemia 04/24/2020   Gastroesophageal reflux disease without esophagitis 04/24/2020   Pain in right knee 04/24/2020   History of cerebral aneurysm 04/17/2018   Genetic testing 06/08/2017   Family history of colon cancer    Family history of breast cancer    Essential hypertension 02/18/2015   Hyperlipidemia 02/18/2015   Chest pain 02/18/2015   Brain aneurysm 11/21/2014   Pain in joint, shoulder region 12/10/2013   LBP radiating to left leg 12/10/2013   Aneurysm, cerebral, nonruptured 12/10/2013   Fibroid uterus 06/22/2012   Menorrhagia 06/22/2012   Dysmenorrhea 06/22/2012     Current Outpatient Medications on File Prior to Visit  Medication Sig Dispense Refill   albuterol (PROVENTIL) (2.5 MG/3ML) 0.083% nebulizer solution Take 3 mLs (2.5 mg total) by nebulization every 6 (six) hours as needed for wheezing or shortness of breath. 150 mL 2   albuterol (VENTOLIN HFA) 108 (90 Base) MCG/ACT inhaler Inhale 1-2 puffs into the lungs every 6 (six) hours as needed for wheezing or shortness of breath. 18 g 1   aspirin 81 MG tablet Take 81 mg by mouth daily.     atenolol (TENORMIN) 50 MG tablet Take 1 tablet (50 mg total) by mouth daily. 30 tablet 2   benzonatate (TESSALON) 100 MG capsule Take 1 capsule (100 mg total) by mouth 3 (three) times daily as needed for cough. 30 capsule 2   Cetirizine HCl 10 MG CAPS Take 1 capsule (10 mg total)  by mouth daily. 15 capsule 0   clopidogrel (PLAVIX) 75 MG tablet Take 0.5 tablets (37.5 mg total) by mouth every other day. 15 tablet 6   enalapril (VASOTEC) 5 MG tablet Take 1 tablet (5 mg total) by mouth daily. 30 tablet 2   famotidine (PEPCID) 40 MG tablet Take 1 tablet (40 mg total) by mouth at bedtime. 30 tablet 0   rosuvastatin (CRESTOR) 10 MG tablet Take 1 tablet (10 mg total) by mouth daily. 90 tablet 3   sucralfate (CARAFATE) 1 GM/10ML suspension Take 10 mLs (1 g total) by mouth in the morning, at noon, and at bedtime. 414 mL 2   triamcinolone cream (KENALOG) 0.5 % Apply 1 application. topically 2 (two) times daily. 30 g 0   valACYclovir (VALTREX) 1000 MG tablet Take 2 tablets po q 12 hours for 2 doses. 30 tablet 2   [DISCONTINUED] clopidogrel (PLAVIX) 75 MG tablet Take 0.5 tablets (37.5 mg total) by mouth every other day. 15 tablet 2   No current facility-administered medications on file prior to visit.    Allergies  Allergen Reactions   Atorvastatin Hives and Itching    Social History   Socioeconomic History   Marital status: Single    Spouse name: Not on file   Number of children: 1   Years of education: BS   Highest education level: Not on file  Occupational History   Occupation: PHARMACIST    Employer: [employer]  Tobacco Use   Smoking status: Never    Passive exposure: Never   Smokeless tobacco: Never  Vaping Use   Vaping Use: Never used  Substance and Sexual Activity   Alcohol use: No    Alcohol/week: 0.0 standard drinks of alcohol   Drug use: No   Sexual activity: Yes    Partners: Male  Other Topics Concern   Not on file  Social History Narrative   Not on file   Social Determinants of Health   Financial Resource Strain: Not on file  Food Insecurity: Not on file  Transportation Needs: Not on file  Physical Activity: Not on file  Stress: Not on file  Social Connections: Not on file  Intimate Partner Violence: Not on file    Family History   Problem Relation Age of Onset   Hypertension Mother    Liver disease Mother    Colon cancer Father 73   Throat cancer Father 16   Heart disease Brother    Aneurysm Brother    Heart attack Brother    Asthma Son    Aneurysm Sister    Aneurysm Brother    Aneurysm Sister    Heart attack Sister    Aneurysm Sister 18   Stroke Maternal Grandmother    Breast cancer Paternal Aunt 53       metastatic   Breast cancer Paternal Aunt 3       metastatic   Colon polyps Brother    Breast cancer Cousin    Breast cancer Paternal Aunt     Past Surgical History:  Procedure Laterality Date   BREAST BIOPSY     age 89 left breast   CARDIOVASCULAR STRESS TEST  05/19/11   05/19/11 Nuclear stress test ([facility]): Normal study, EF 87%   CESAREAN SECTION  09/2005   CT CORONARY CA SCORING  06/23/12   06/23/12 Cardiac CTA ([facility]): Ca score 0. Right dominant. Normal coronaries.   INTRACRANIAL ANEURYSM REPAIR     MYOMECTOMY  2005   RADIOLOGY WITH ANESTHESIA N/A 11/21/2014   Procedure: Embolization;  Surgeon: Luanne Bras, MD;  Location: [place];  Service: Radiology;  Laterality: N/A;   US ECHOCARDIOGRAPHY  05/18/11   05/18/11 Echo ([place]): NL chamber size, LV systolic function, wall motion. LVEF 77%. Mild LVH. Mild MR/TR. NL PAP.    ROS: Review of Systems Negative except as stated above  PHYSICAL EXAM: LMP  (LMP Unknown)   Physical Exam  {female adult master:310786} {female adult master:310785}     Latest Ref Rng & Units 10/01/2021   11:48 AM 04/30/2021    9:42 AM 08/07/2020   12:00 AM  CMP  Glucose 70 - 99 mg/dL 81  82  74   BUN 6 - 24 mg/dL '17  17  14   '$ Creatinine 0.57 - 1.00 mg/dL 0.83  0.87  0.80   Sodium 134 - 144 mmol/L 140  140  141   Potassium 3.5 - 5.2 mmol/L 4.9  4.5  4.9   Chloride 96 - 106 mmol/L 104  105  102   CO2 20 - 29 mmol/L '25  30  27   '$ Calcium 8.7 - 10.2 mg/dL 10.0  9.7  10.2   Total Protein 6.1 - 8.1 g/dL  6.7  7.0   Total Bilirubin 0.2 -  1.2 mg/dL  0.4  0.3   Alkaline Phos 44 - 121 IU/L   97   AST 10 - 35 U/L  20  16   ALT 6 - 29 U/L  14  7    Lipid Panel     Component Value Date/Time   CHOL 226 (H) 02/04/2022 1048   TRIG 74 02/04/2022 1048   HDL 78 02/04/2022 1048   CHOLHDL 2.9 02/04/2022 1048   CHOLHDL 3.1 04/30/2021 0942   VLDL 15 12/16/2015 0851   LDLCALC 135 (H) 02/04/2022 1048   LDLCALC 129 (H) 04/30/2021 0942    CBC    Component Value Date/Time   WBC 4.2 04/30/2021 0942   RBC 4.46 04/30/2021 0942   HGB 12.9 04/30/2021 0942   HGB 13.5 04/18/2019 0938   HGB 12.7 11/28/2013 0810   HCT 40.2 04/30/2021 0942   HCT 41.5 04/18/2019 0938   PLT 219 04/30/2021 0942   PLT 224 04/18/2019 0938   MCV 90.1 04/30/2021 0942   MCV 90 04/18/2019 0938   MCH 28.9 04/30/2021 0942   MCHC 32.1 04/30/2021 0942   RDW 13.3 04/30/2021 0942   RDW 13.3 04/18/2019 0938   LYMPHSABS 2.4 11/22/2014 0450   MONOABS 0.8 11/22/2014 0450   EOSABS 0.0 11/22/2014 0450   BASOSABS 0.0 11/22/2014 0450    ASSESSMENT AND PLAN:  There are no diagnoses linked to this encounter.   Patient was given the opportunity to ask questions.  Patient verbalized understanding of the plan and was able to repeat key elements of the plan. Patient was given clear instructions to go to Emergency Department or return to medical center if symptoms don't improve, worsen, or new problems develop.The patient verbalized understanding.   No orders of the defined types were placed in this encounter.    Requested Prescriptions    No prescriptions requested or ordered in this encounter    No follow-ups on file.  Camillia Herter, NP

## 2022-05-20 ENCOUNTER — Encounter: Payer: Self-pay | Admitting: Family

## 2022-05-20 ENCOUNTER — Ambulatory Visit: Payer: 59 | Admitting: Family

## 2022-05-20 VITALS — BP 124/79 | HR 68 | Temp 98.3°F | Resp 16 | Ht 62.99 in | Wt 126.0 lb

## 2022-05-20 DIAGNOSIS — M25522 Pain in left elbow: Secondary | ICD-10-CM

## 2022-05-20 DIAGNOSIS — E785 Hyperlipidemia, unspecified: Secondary | ICD-10-CM

## 2022-05-20 DIAGNOSIS — Z0110 Encounter for examination of ears and hearing without abnormal findings: Secondary | ICD-10-CM

## 2022-05-20 DIAGNOSIS — Z131 Encounter for screening for diabetes mellitus: Secondary | ICD-10-CM | POA: Diagnosis not present

## 2022-05-20 DIAGNOSIS — I10 Essential (primary) hypertension: Secondary | ICD-10-CM | POA: Diagnosis not present

## 2022-05-20 DIAGNOSIS — R5383 Other fatigue: Secondary | ICD-10-CM

## 2022-05-20 DIAGNOSIS — H9311 Tinnitus, right ear: Secondary | ICD-10-CM

## 2022-05-20 DIAGNOSIS — M25521 Pain in right elbow: Secondary | ICD-10-CM

## 2022-05-20 MED ORDER — ROSUVASTATIN CALCIUM 10 MG PO TABS: 10.0000 mg | ORAL_TABLET | Freq: Every day | ORAL | 2 refills | Status: DC

## 2022-05-20 MED ORDER — ENALAPRIL MALEATE 5 MG PO TABS: 5.0000 mg | ORAL_TABLET | Freq: Every day | ORAL | 2 refills | Status: DC

## 2022-05-20 MED ORDER — ATENOLOL 50 MG PO TABS: 50.0000 mg | ORAL_TABLET | Freq: Every day | ORAL | 2 refills | Status: DC

## 2022-05-20 NOTE — Progress Notes (Signed)
.  Pt presents for chronic care management   -complains of burning pain in both elbows frequently -experiencing tinnitus in right ear for about 3-4 months

## 2022-05-21 ENCOUNTER — Encounter: Payer: Self-pay | Admitting: Family

## 2022-05-21 DIAGNOSIS — R7303 Prediabetes: Secondary | ICD-10-CM | POA: Insufficient documentation

## 2022-05-21 LAB — BASIC METABOLIC PANEL
BUN/Creatinine Ratio: 12 (ref 9–23)
BUN: 10 mg/dL (ref 6–24)
CO2: 22 mmol/L (ref 20–29)
Calcium: 9.6 mg/dL (ref 8.7–10.2)
Chloride: 105 mmol/L (ref 96–106)
Creatinine, Ser: 0.85 mg/dL (ref 0.57–1.00)
Glucose: 65 mg/dL — ABNORMAL LOW (ref 70–99)
Potassium: 4.7 mmol/L (ref 3.5–5.2)
Sodium: 142 mmol/L (ref 134–144)
eGFR: 82 mL/min/{1.73_m2} (ref >59)

## 2022-05-21 LAB — HEMOGLOBIN A1C
Est. average glucose Bld gHb Est-mCnc: 120 mg/dL
Hgb A1c MFr Bld: 5.8 % — ABNORMAL HIGH (ref 4.8–5.6)

## 2022-05-25 NOTE — Progress Notes (Signed)
52 y.o. G1P1 Single Black or African American Not Hispanic or Latino female here for annual exam.  No vaginal bleeding. Vasomotor symptoms have improved.    No bowel or bladder c/o.  She has an elevated risk of breast cancer of 26.8%. FH of colon cancer, needs q 5 year colonoscopies.  Genetic test results -negative.  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    H/O rare oral hsv.     No LMP recorded (lmp unknown). Patient is postmenopausal.          Sexually active: No.  The current method of family planning is post menopausal status.    Exercising: No.  The patient does not participate in regular exercise at present. Smoker:  no  Health Maintenance: Pap:   04/24/20 WNL Hr HPV Neg; 03/09/16 Neg:Neg HR HPV  History of abnormal Pap:   yes f/u was normal   MMG:  07/04/20 Bi-Rad 1, cat d Breast MRI: 01/05/21  Bi-rads 1 neg  BMD:   n/a  Colonoscopy: 5/19 with [facility], doing every 5 years  TDaP:  12/04/14  Gardasil: n/a   reports that she has never smoked. She has never been exposed to tobacco smoke. She has never used smokeless tobacco. She reports that she does not drink alcohol and does not use drugs. 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 Son is 83, Paramedic. Parents have passed. She is one of 14 children, very close with her siblings.     Past Medical History:  Diagnosis Date   Abnormal pap    Cerebral aneurysm without rupture    Chest pain radiating to arm    Family history of breast cancer    Family history of colon cancer    Family history of heart disease    Gastroesophageal reflux    Gestational diabetes    Hyperlipidemia    Hypertension    Mitral valve regurgitation    PONV (postoperative nausea and vomiting)    Prediabetes     Past Surgical History:  Procedure Laterality Date   BREAST BIOPSY     age 63 left breast   CARDIOVASCULAR STRESS TEST  05/19/11   05/19/11 Nuclear stress test ([facility]): Normal study, EF 87%   CESAREAN SECTION  09/2005   CT CORONARY CA SCORING   06/23/12   06/23/12 Cardiac CTA ([place]): Ca score 0. Right dominant. Normal coronaries.   INTRACRANIAL ANEURYSM REPAIR     MYOMECTOMY  2005   RADIOLOGY WITH ANESTHESIA N/A 11/21/2014   Procedure: Embolization;  Surgeon: Luanne Bras, MD;  Location: [place];  Service: Radiology;  Laterality: N/A;   US ECHOCARDIOGRAPHY  05/18/11   05/18/11 Echo ([place]): NL chamber size, LV systolic function, wall motion. LVEF 77%. Mild LVH. Mild MR/TR. NL PAP.    Current Outpatient Medications  Medication Sig Dispense Refill   albuterol (PROVENTIL) (2.5 MG/3ML) 0.083% nebulizer solution Take 3 mLs (2.5 mg total) by nebulization every 6 (six) hours as needed for wheezing or shortness of breath. 150 mL 2   albuterol (VENTOLIN HFA) 108 (90 Base) MCG/ACT inhaler Inhale 1-2 puffs into the lungs every 6 (six) hours as needed for wheezing or shortness of breath. 18 g 1   aspirin 81 MG tablet Take 81 mg by mouth daily.     atenolol (TENORMIN) 50 MG tablet Take 1 tablet (50 mg total) by mouth daily. 30 tablet 2   Cetirizine HCl 10 MG CAPS Take 1 capsule (10 mg total) by mouth daily. 15 capsule 0  clopidogrel (PLAVIX) 75 MG tablet Take 0.5 tablets (37.5 mg total) by mouth every other day. 15 tablet 6   enalapril (VASOTEC) 5 MG tablet Take 1 tablet (5 mg total) by mouth daily. 30 tablet 2   famotidine (PEPCID) 40 MG tablet Take 1 tablet (40 mg total) by mouth at bedtime. 30 tablet 0   sucralfate (CARAFATE) 1 GM/10ML suspension Take 10 mLs (1 g total) by mouth in the morning, at noon, and at bedtime. 414 mL 2   triamcinolone cream (KENALOG) 0.5 % Apply 1 application. topically 2 (two) times daily. 30 g 0   valACYclovir (VALTREX) 1000 MG tablet Take 2 tablets po q 12 hours for 2 doses. 30 tablet 2   No current facility-administered medications for this visit.    Family History  Problem Relation Age of Onset   Hypertension Mother    Liver disease Mother    Colon cancer Father 82   Throat cancer  Father 53   Heart disease Brother    Aneurysm Brother    Heart attack Brother    Asthma Son    Aneurysm Sister    Aneurysm Brother    Aneurysm Sister    Heart attack Sister    Aneurysm Sister 18   Stroke Maternal Grandmother    Breast cancer Paternal Aunt 54       metastatic   Breast cancer Paternal Aunt 67       metastatic   Colon polyps Brother    Breast cancer Cousin    Breast cancer Paternal Aunt     Review of Systems  All other systems reviewed and are negative.   Exam:   BP 112/64   Pulse 69   Ht '5\' 3"'$  (1.6 m)   Wt 130 lb (59 kg)   LMP  (LMP Unknown)   SpO2 99%   BMI 23.03 kg/m   Weight change: '@WEIGHTCHANGE'$ @ Height:   Height: '5\' 3"'$  (160 cm)  Ht Readings from Last 3 Encounters:  06/03/22 '5\' 3"'$  (1.6 m)  05/20/22 5' 2.99" (1.6 m)  02/04/22 5' 2.99" (1.6 m)    General appearance: alert, cooperative and appears stated age Head: Normocephalic, without obvious abnormality, atraumatic Neck: no adenopathy, supple, symmetrical, trachea midline and thyroid normal to inspection and palpation Lungs: clear to auscultation bilaterally Cardiovascular: regular rate and rhythm Breasts: normal appearance, no masses or tenderness Abdomen: soft, non-tender; non distended,  no masses,  no organomegaly Extremities: extremities normal, atraumatic, no cyanosis or edema Skin: Skin color, texture, turgor normal. No rashes or lesions Lymph nodes: Cervical, supraclavicular, and axillary nodes normal. No abnormal inguinal nodes palpated Neurologic: Grossly normal   Pelvic: External genitalia:  no lesions              Urethra:  normal appearing urethra with no masses, tenderness or lesions              Bartholins and Skenes: normal                 Vagina: atrophic appearing vagina with normal color and discharge, no lesions              Cervix: no lesions               Bimanual Exam:  Uterus:  normal size, contour, position, consistency, mobility, non-tender              Adnexa:  no mass, fullness, tenderness  Rectovaginal: Confirms               Anus:  normal sphincter tone, no lesions  Gae Dry, CMA chaperoned for the exam.  1. Well woman exam Discussed breast self exam Discussed calcium and vit D intake Labs with primary Mammogram overdue, she will schedule Colonoscopy due in 5/24  2. Increased risk of breast cancer She will schedule her mammogram Plan breast MRI in 6 months

## 2022-05-26 ENCOUNTER — Encounter: Payer: Self-pay | Admitting: Family

## 2022-05-27 LAB — TSH

## 2022-06-03 ENCOUNTER — Telehealth: Payer: Self-pay

## 2022-06-03 ENCOUNTER — Ambulatory Visit (INDEPENDENT_AMBULATORY_CARE_PROVIDER_SITE_OTHER): Payer: 59 | Admitting: Obstetrics and Gynecology

## 2022-06-03 ENCOUNTER — Encounter: Payer: Self-pay | Admitting: Obstetrics and Gynecology

## 2022-06-03 VITALS — BP 112/64 | HR 69 | Ht 63.0 in | Wt 130.0 lb

## 2022-06-03 DIAGNOSIS — Z9189 Other specified personal risk factors, not elsewhere classified: Secondary | ICD-10-CM | POA: Diagnosis not present

## 2022-06-03 DIAGNOSIS — Z01419 Encounter for gynecological examination (general) (routine) without abnormal findings: Secondary | ICD-10-CM | POA: Diagnosis not present

## 2022-06-03 NOTE — Patient Instructions (Signed)

## 2022-06-03 NOTE — Telephone Encounter (Signed)
Sharon Dom, MD  [facility] Triage [name], [name]

## 2022-06-07 NOTE — Telephone Encounter (Signed)
Spoke with patient and informed her that 6 mos recall letter is placed and MRI order for 6 mos has been placed as well. [facility] should call her to schedule but she is welcome to call them at that time. She was provided phone number with prompts.

## 2022-06-11 NOTE — Progress Notes (Unsigned)
Patient ID: Sharon Mendoza, female    DOB: December 07, 1970  MRN: [id number]  CC: Follow-Up  Subjective: Sharon Mendoza is a 52 y.o. female who presents for follow-up.   Her concerns today include:  Reports shortly after her previous primary care visit she developed a knot of the right ear preauricular area. States the knot has remained about the same size since she noticed it. Endorses intermittent pain and feeling of ear pressure. She denies additional symptoms. She has an appointment scheduled with ENT in May. She is also on their waiting list to see if she can get an appointment sooner. No further issues/concerns for discussion today.   Patient Active Problem List   Diagnosis Date Noted   Prediabetes 05/21/2022   Ganglion cyst of flexor tendon sheath of finger, left 08/27/2021   Varicose veins of bilateral lower extremities with pain 04/30/2021   Family history of coronary artery disease 05/08/2020   Dyslipidemia 04/24/2020   Gastroesophageal reflux disease without esophagitis 04/24/2020   Pain in right knee 04/24/2020   History of cerebral aneurysm 04/17/2018   Genetic testing 06/08/2017   Family history of colon cancer    Family history of breast cancer    Essential hypertension 02/18/2015   Hyperlipidemia 02/18/2015   Chest pain 02/18/2015   Brain aneurysm 11/21/2014   Pain in joint, shoulder region 12/10/2013   LBP radiating to left leg 12/10/2013   Aneurysm, cerebral, nonruptured 12/10/2013   Fibroid uterus 06/22/2012   Menorrhagia 06/22/2012   Dysmenorrhea 06/22/2012     Current Outpatient Medications on File Prior to Visit  Medication Sig Dispense Refill   albuterol (PROVENTIL) (2.5 MG/3ML) 0.083% nebulizer solution Take 3 mLs (2.5 mg total) by nebulization every 6 (six) hours as needed for wheezing or shortness of breath. 150 mL 2   albuterol (VENTOLIN HFA) 108 (90 Base) MCG/ACT inhaler Inhale 1-2 puffs into the lungs every 6 (six) hours as needed for wheezing or  shortness of breath. 18 g 1   aspirin 81 MG tablet Take 81 mg by mouth daily.     atenolol (TENORMIN) 50 MG tablet Take 1 tablet (50 mg total) by mouth daily. 30 tablet 2   Cetirizine HCl 10 MG CAPS Take 1 capsule (10 mg total) by mouth daily. 15 capsule 0   clopidogrel (PLAVIX) 75 MG tablet Take 0.5 tablets (37.5 mg total) by mouth every other day. 15 tablet 6   enalapril (VASOTEC) 5 MG tablet Take 1 tablet (5 mg total) by mouth daily. 30 tablet 2   famotidine (PEPCID) 40 MG tablet Take 1 tablet (40 mg total) by mouth at bedtime. 30 tablet 0   sucralfate (CARAFATE) 1 GM/10ML suspension Take 10 mLs (1 g total) by mouth in the morning, at noon, and at bedtime. 414 mL 2   triamcinolone cream (KENALOG) 0.5 % Apply 1 application. topically 2 (two) times daily. 30 g 0   valACYclovir (VALTREX) 1000 MG tablet Take 2 tablets po q 12 hours for 2 doses. 30 tablet 2   [DISCONTINUED] clopidogrel (PLAVIX) 75 MG tablet Take 0.5 tablets (37.5 mg total) by mouth every other day. 15 tablet 2   No current facility-administered medications on file prior to visit.    Allergies  Allergen Reactions   Atorvastatin Hives and Itching    Social History   Socioeconomic History   Marital status: Single    Spouse name: Not on file   Number of children: 1   Years of education: BS   Highest  education level: Not on file  Occupational History   Occupation: PHARMACIST    Employer: [employer]  Tobacco Use   Smoking status: Never    Passive exposure: Never   Smokeless tobacco: Never  Vaping Use   Vaping Use: Never used  Substance and Sexual Activity   Alcohol use: No    Alcohol/week: 0.0 standard drinks of alcohol   Drug use: No   Sexual activity: Yes    Partners: Male  Other Topics Concern   Not on file  Social History Narrative   Not on file   Social Determinants of Health   Financial Resource Strain: Not on file  Food Insecurity: Not on file  Transportation Needs: Not on file  Physical  Activity: Not on file  Stress: Not on file  Social Connections: Not on file  Intimate Partner Violence: Not on file    Family History  Problem Relation Age of Onset   Hypertension Mother    Liver disease Mother    Colon cancer Father 66   Throat cancer Father 75   Heart disease Brother    Aneurysm Brother    Heart attack Brother    Asthma Son    Aneurysm Sister    Aneurysm Brother    Aneurysm Sister    Heart attack Sister    Aneurysm Sister 18   Stroke Maternal Grandmother    Breast cancer Paternal Aunt 27       metastatic   Breast cancer Paternal Aunt 5       metastatic   Colon polyps Brother    Breast cancer Cousin    Breast cancer Paternal Aunt     Past Surgical History:  Procedure Laterality Date   BREAST BIOPSY     age 70 left breast   CARDIOVASCULAR STRESS TEST  05/19/11   05/19/11 Nuclear stress test ([facility]): Normal study, EF 87%   CESAREAN SECTION  09/2005   CT CORONARY CA SCORING  06/23/12   06/23/12 Cardiac CTA ([facility]): Ca score 0. Right dominant. Normal coronaries.   INTRACRANIAL ANEURYSM REPAIR     MYOMECTOMY  2005   RADIOLOGY WITH ANESTHESIA N/A 11/21/2014   Procedure: Embolization;  Surgeon: Luanne Bras, MD;  Location: [facility];  Service: Radiology;  Laterality: N/A;   US ECHOCARDIOGRAPHY  05/18/11   05/18/11 Echo ([facility]): NL chamber size, LV systolic function, wall motion. LVEF 77%. Mild LVH. Mild MR/TR. NL PAP.    ROS: Review of Systems Negative except as stated above  PHYSICAL EXAM: BP 114/77 (BP Location: Left Arm, Patient Position: Sitting, Cuff Size: Normal)   Pulse 62   Temp 98.3 F (36.8 C)   Resp 16   Ht 5' 2.99" (1.6 m)   Wt 126 lb (57.2 kg)   LMP  (LMP Unknown)   SpO2 94%   BMI 22.33 kg/m   Physical Exam HENT:     Head: Normocephalic and atraumatic.     Right Ear: Tympanic membrane, ear canal and external ear normal.     Left Ear: Tympanic membrane, ear canal and external ear normal.     Ears:      Comments: Right preauricular nodule (about pea sized) firm and discomfort with palpation. No additional presentation.     Nose: Nose normal.     Mouth/Throat:     Mouth: Mucous membranes are moist.     Pharynx: Oropharynx is clear.  Eyes:     Extraocular Movements: Extraocular movements intact.  Conjunctiva/sclera: Conjunctivae normal.     Pupils: Pupils are equal, round, and reactive to light.  Cardiovascular:     Rate and Rhythm: Normal rate and regular rhythm.     Pulses: Normal pulses.     Heart sounds: Normal heart sounds.  Pulmonary:     Effort: Pulmonary effort is normal.     Breath sounds: Normal breath sounds.  Musculoskeletal:     Cervical back: Normal range of motion and neck supple.  Skin:    General: Skin is warm and dry.  Neurological:     General: No focal deficit present.     Mental Status: She is alert and oriented to person, place, and time.  Psychiatric:        Mood and Affect: Mood normal.        Behavior: Behavior normal.     ASSESSMENT AND PLAN: Note - I consulted with Dorna Mai, MD for plan of care.   1. Nodule of external ear, right - Amoxicillin-Clavulanate as prescribed. Counseled on medication adherence.  - Keep all scheduled appointments with ENT.  - Follow-up with primary provider as scheduled.  - amoxicillin-clavulanate (AUGMENTIN) 875-125 MG tablet; Take 1 tablet by mouth 2 (two) times daily for 7 days.  Dispense: 14 tablet; Refill: 0   Patient was given the opportunity to ask questions.  Patient verbalized understanding of the plan and was able to repeat key elements of the plan. Patient was given clear instructions to go to Emergency Department or return to medical center if symptoms don't improve, worsen, or new problems develop.The patient verbalized understanding.   Requested Prescriptions   Signed Prescriptions Disp Refills   amoxicillin-clavulanate (AUGMENTIN) 875-125 MG tablet 14 tablet 0    Sig: Take 1 tablet by mouth 2  (two) times daily for 7 days.    Follow-up with primary provider as scheduled.   Camillia Herter, NP

## 2022-06-14 ENCOUNTER — Ambulatory Visit: Payer: 59 | Admitting: Family

## 2022-06-14 VITALS — BP 114/77 | HR 62 | Temp 98.3°F | Resp 16 | Ht 62.99 in | Wt 126.0 lb

## 2022-06-14 DIAGNOSIS — R229 Localized swelling, mass and lump, unspecified: Secondary | ICD-10-CM

## 2022-06-14 DIAGNOSIS — H61891 Other specified disorders of right external ear: Secondary | ICD-10-CM | POA: Diagnosis not present

## 2022-06-14 MED ORDER — AMOXICILLIN-POT CLAVULANATE 875-125 MG PO TABS: 1.0000 | ORAL_TABLET | Freq: Two times a day (BID) | ORAL | 0 refills | Status: AC

## 2022-06-14 NOTE — Progress Notes (Unsigned)
Pt presents for right ear pain -lump present at ear opening at top of jaw

## 2022-06-24 ENCOUNTER — Other Ambulatory Visit: Payer: Self-pay | Admitting: Obstetrics and Gynecology

## 2022-06-24 DIAGNOSIS — Z1231 Encounter for screening mammogram for malignant neoplasm of breast: Secondary | ICD-10-CM

## 2022-06-25 ENCOUNTER — Encounter: Payer: Self-pay | Admitting: Family

## 2022-06-25 NOTE — Telephone Encounter (Signed)
After discussion with Georganna Skeans, MD no imaging indicated at this time. Encouraged to keep upcoming ENT for further evaluation/management.

## 2022-07-20 ENCOUNTER — Telehealth (HOSPITAL_COMMUNITY): Payer: Self-pay

## 2022-07-20 NOTE — Telephone Encounter (Signed)
Returned pt's call, no answer, left vm. AB  

## 2022-07-21 ENCOUNTER — Telehealth (HOSPITAL_COMMUNITY): Payer: Self-pay

## 2022-07-21 NOTE — Telephone Encounter (Signed)
-----   Message from Brayton El, PA-C sent at 07/20/2022  3:42 PM EDT ----- Regarding: RE: symptoms Ear sxs not likely coming from anything Dev is following her for. Would let her see her ENT as scheduled and go from there. As far as the dental screw, would need to find out from the oral surgeon if the titanium screw is 'MRI compatible'. A lot of titanium implants are MRI compatible these days but no way for [place] to know. She'd have to get the product info from her surgeon.  Caryn Bee  ----- Message ----- From: Sharee Pimple Sent: 07/20/2022   2:22 PM EDT To: Brayton El, PA-C; Shirlyn Goltz Subject: symptoms                                       [place],   Pt called concerned about a knot in front of right ear and lots of pressure on that right side. She also has some fluttering in her ear. She is scheduled to see an ENT doc next week but wanted to make Dev aware in case he wanted her to do anything or come in for clinic visit.   She also stated that she has a dental visit with an oral surgeon coming up bc her root canal has failed. It will require putting a titanium screw in. She isn't sure if that will prevent her from future mri's.   Please discuss with Dr. Corliss Skains and advise.   Thanks,  Fara Boros

## 2022-07-22 ENCOUNTER — Ambulatory Visit
Admission: RE | Admit: 2022-07-22 | Discharge: 2022-07-22 | Disposition: A | Discharge status: Home / Self Care | Payer: 59 | Source: Ambulatory Visit

## 2022-07-22 DIAGNOSIS — Z1231 Encounter for screening mammogram for malignant neoplasm of breast: Secondary | ICD-10-CM

## 2022-08-17 ENCOUNTER — Other Ambulatory Visit: Payer: Self-pay | Admitting: Family

## 2022-08-17 ENCOUNTER — Other Ambulatory Visit: Payer: Self-pay

## 2022-08-17 DIAGNOSIS — I10 Essential (primary) hypertension: Secondary | ICD-10-CM

## 2022-08-17 MED ORDER — ENALAPRIL MALEATE 5 MG PO TABS: 5.0000 mg | ORAL_TABLET | Freq: Every day | ORAL | 2 refills | Status: DC

## 2022-08-24 ENCOUNTER — Other Ambulatory Visit: Payer: Self-pay | Admitting: *Deleted

## 2022-08-24 DIAGNOSIS — I10 Essential (primary) hypertension: Secondary | ICD-10-CM

## 2022-08-24 MED ORDER — ATENOLOL 50 MG PO TABS: 50.0000 mg | ORAL_TABLET | Freq: Every day | ORAL | 2 refills | Status: DC

## 2022-09-30 ENCOUNTER — Encounter: Payer: Self-pay | Admitting: Family

## 2022-09-30 ENCOUNTER — Ambulatory Visit: Payer: 59 | Admitting: Family

## 2022-09-30 VITALS — BP 118/78 | HR 67 | Temp 98.5°F | Ht 63.0 in | Wt 124.2 lb

## 2022-09-30 DIAGNOSIS — Z130 Encounter for screening for diseases of the blood and blood-forming organs and certain disorders involving the immune mechanism: Secondary | ICD-10-CM

## 2022-09-30 DIAGNOSIS — Z131 Encounter for screening for diabetes mellitus: Secondary | ICD-10-CM | POA: Diagnosis not present

## 2022-09-30 DIAGNOSIS — Z8639 Personal history of other endocrine, nutritional and metabolic disease: Secondary | ICD-10-CM | POA: Diagnosis not present

## 2022-09-30 DIAGNOSIS — Z1329 Encounter for screening for other suspected endocrine disorder: Secondary | ICD-10-CM

## 2022-09-30 DIAGNOSIS — Z13220 Encounter for screening for lipoid disorders: Secondary | ICD-10-CM

## 2022-09-30 DIAGNOSIS — I10 Essential (primary) hypertension: Secondary | ICD-10-CM

## 2022-09-30 NOTE — Progress Notes (Signed)
Pt wants A1c checked.  Pt is asking for a full blood work lab to be done

## 2022-09-30 NOTE — Progress Notes (Signed)
Patient ID: Sharon Mendoza, female    DOB: 02/17/1971  MRN: [id number]  CC: Chronic Care Management   Subjective: Sharon Mendoza is a 52 y.o. female who presents for chronic care management.   Her concerns today include:  - Doing well on Atenolol and Enalapril, no issues/concerns. States she recently changed her diet and no longer eating chicken, seafood, and [place]. Reports she has not eaten beef or pork in 30 years. She does not complain of red flag symptoms such as but not limited to chest pain, shortness of breath, worst headache of life, nausea/vomiting.   - Needs labs updated for her employer. - Plans to schedule colon cancer screening with Gastroenterology at later date. - No further issues/concerns for discussion today.   Patient Active Problem List   Diagnosis Date Noted   Prediabetes 05/21/2022   Ganglion cyst of flexor tendon sheath of finger, left 08/27/2021   Varicose veins of bilateral lower extremities with pain 04/30/2021   Family history of coronary artery disease 05/08/2020   Dyslipidemia 04/24/2020   Gastroesophageal reflux disease without esophagitis 04/24/2020   Pain in right knee 04/24/2020   History of cerebral aneurysm 04/17/2018   Genetic testing 06/08/2017   Family history of colon cancer    Family history of breast cancer    Essential hypertension 02/18/2015   Hyperlipidemia 02/18/2015   Chest pain 02/18/2015   Brain aneurysm 11/21/2014   Pain in joint, shoulder region 12/10/2013   LBP radiating to left leg 12/10/2013   Aneurysm, cerebral, nonruptured 12/10/2013   Fibroid uterus 06/22/2012   Menorrhagia 06/22/2012   Dysmenorrhea 06/22/2012     Current Outpatient Medications on File Prior to Visit  Medication Sig Dispense Refill   albuterol (PROVENTIL) (2.5 MG/3ML) 0.083% nebulizer solution Take 3 mLs (2.5 mg total) by nebulization every 6 (six) hours as needed for wheezing or shortness of breath. 150 mL 2   albuterol (VENTOLIN HFA) 108 (90  Base) MCG/ACT inhaler Inhale 1-2 puffs into the lungs every 6 (six) hours as needed for wheezing or shortness of breath. 18 g 1   aspirin 81 MG tablet Take 81 mg by mouth daily.     atenolol (TENORMIN) 50 MG tablet Take 1 tablet (50 mg total) by mouth daily. 30 tablet 2   Cetirizine HCl 10 MG CAPS Take 1 capsule (10 mg total) by mouth daily. 15 capsule 0   clopidogrel (PLAVIX) 75 MG tablet Take 0.5 tablets (37.5 mg total) by mouth every other day. 15 tablet 6   enalapril (VASOTEC) 5 MG tablet Take 1 tablet (5 mg total) by mouth daily. 30 tablet 2   famotidine (PEPCID) 40 MG tablet Take 1 tablet (40 mg total) by mouth at bedtime. 30 tablet 0   valACYclovir (VALTREX) 1000 MG tablet Take 2 tablets po q 12 hours for 2 doses. 30 tablet 2   sucralfate (CARAFATE) 1 GM/10ML suspension Take 10 mLs (1 g total) by mouth in the morning, at noon, and at bedtime. (Patient not taking: Reported on 09/30/2022) 414 mL 2   triamcinolone cream (KENALOG) 0.5 % Apply 1 application. topically 2 (two) times daily. (Patient not taking: Reported on 09/30/2022) 30 g 0   [DISCONTINUED] clopidogrel (PLAVIX) 75 MG tablet Take 0.5 tablets (37.5 mg total) by mouth every other day. 15 tablet 2   No current facility-administered medications on file prior to visit.    Allergies  Allergen Reactions   Atorvastatin Hives and Itching    Social History   Socioeconomic  History   Marital status: Single    Spouse name: Not on file   Number of children: 1   Years of education: BS   Highest education level: Not on file  Occupational History   Occupation: PHARMACIST    Employer: [employer]  Tobacco Use   Smoking status: Never    Passive exposure: Never   Smokeless tobacco: Never  Vaping Use   Vaping status: Never Used  Substance and Sexual Activity   Alcohol use: No    Alcohol/week: 0.0 standard drinks of alcohol   Drug use: No   Sexual activity: Yes    Partners: Male  Other Topics Concern   Not on file  Social  History Narrative   Not on file   Social Determinants of Health   Financial Resource Strain: Not on file  Food Insecurity: Not on file  Transportation Needs: Not on file  Physical Activity: Not on file  Stress: Not on file  Social Connections: Not on file  Intimate Partner Violence: Not on file    Family History  Problem Relation Age of Onset   Hypertension Mother    Liver disease Mother    Colon cancer Father 37   Throat cancer Father 7   Heart disease Brother    Aneurysm Brother    Heart attack Brother    Asthma Son    Aneurysm Sister    Aneurysm Brother    Aneurysm Sister    Heart attack Sister    Aneurysm Sister 18   Stroke Maternal Grandmother    Breast cancer Paternal Aunt 58       metastatic   Breast cancer Paternal Aunt 68       metastatic   Colon polyps Brother    Breast cancer Cousin    Breast cancer Paternal Aunt     Past Surgical History:  Procedure Laterality Date   BREAST BIOPSY     age 83 left breast   CARDIOVASCULAR STRESS TEST  05/19/11   05/19/11 Nuclear stress test ([facility]): Normal study, EF 87%   CESAREAN SECTION  09/2005   CT CORONARY CA SCORING  06/23/12   06/23/12 Cardiac CTA ([facility]): Ca score 0. Right dominant. Normal coronaries.   INTRACRANIAL ANEURYSM REPAIR     MYOMECTOMY  2005   RADIOLOGY WITH ANESTHESIA N/A 11/21/2014   Procedure: Embolization;  Surgeon: Julieanne Cotton, MD;  Location: [facility];  Service: Radiology;  Laterality: N/A;   US ECHOCARDIOGRAPHY  05/18/11   05/18/11 Echo ([facility]): NL chamber size, LV systolic function, wall motion. LVEF 77%. Mild LVH. Mild MR/TR. NL PAP.    ROS: Review of Systems Negative except as stated above  PHYSICAL EXAM: BP 118/78   Pulse 67   Temp 98.5 F (36.9 C) (Oral)   Ht 5\' 3"  (1.6 m)   Wt 124 lb 3.2 oz (56.3 kg)   LMP  (LMP Unknown)   SpO2 98%   BMI 22.00 kg/m   Physical Exam HENT:     Head: Normocephalic and atraumatic.     Nose: Nose normal.      Mouth/Throat:     Mouth: Mucous membranes are moist.     Pharynx: Oropharynx is clear.  Eyes:     Extraocular Movements: Extraocular movements intact.     Conjunctiva/sclera: Conjunctivae normal.     Pupils: Pupils are equal, round, and reactive to light.  Cardiovascular:     Rate and Rhythm: Normal rate and regular rhythm.  Pulses: Normal pulses.     Heart sounds: Normal heart sounds.  Pulmonary:     Effort: Pulmonary effort is normal.     Breath sounds: Normal breath sounds.  Musculoskeletal:        General: Normal range of motion.     Right shoulder: Normal.     Left shoulder: Normal.     Right upper arm: Normal.     Left upper arm: Normal.     Right elbow: Normal.     Left elbow: Normal.     Right forearm: Normal.     Left forearm: Normal.     Right wrist: Normal.     Left wrist: Normal.     Right hand: Normal.     Left hand: Normal.     Cervical back: Normal, normal range of motion and neck supple.     Thoracic back: Normal.     Lumbar back: Normal.     Right hip: Normal.     Left hip: Normal.     Right upper leg: Normal.     Left upper leg: Normal.     Right knee: Normal.     Left knee: Normal.     Right lower leg: Normal.     Left lower leg: Normal.     Right ankle: Normal.     Left ankle: Normal.     Right foot: Normal.     Left foot: Normal.  Neurological:     General: No focal deficit present.     Mental Status: She is alert and oriented to person, place, and time.  Psychiatric:        Mood and Affect: Mood normal.        Behavior: Behavior normal.      ASSESSMENT AND PLAN: 1. Primary hypertension - Continue Atenolol ND Enalapril as prescribed. No refills needed as of present.  - Routine screening.  - Counseled on blood pressure goal of less than 130/80, low-sodium, DASH diet, medication compliance, and 150 minutes of moderate intensity exercise per week as tolerated. Counseled on medication adherence and adverse effects. - Follow-up with primary  provider in 3 months or sooner if needed.  - CMP14+EGFR  2. History of hyperlipidemia 3. Screening cholesterol level - Routine screening.  - Lipid panel  4. Diabetes mellitus screening - Routine screening.  - Hemoglobin A1c  5. Screening for deficiency anemia - Routine screening.  - CBC  6. Thyroid disorder screen - Routine screening.  - TSH   Patient was given the opportunity to ask questions.  Patient verbalized understanding of the plan and was able to repeat key elements of the plan. Patient was given clear instructions to go to Emergency Department or return to medical center if symptoms don't improve, worsen, or new problems develop.The patient verbalized understanding.   Orders Placed This Encounter  Procedures   CBC   Lipid panel   TSH   CMP14+EGFR   Hemoglobin A1c    Return in about 3 months (around 12/31/2022) for Follow-Up or next available chronic care mgmt .  Rema Fendt, NP

## 2022-10-01 ENCOUNTER — Encounter: Payer: Self-pay | Admitting: Family

## 2022-10-01 ENCOUNTER — Other Ambulatory Visit: Payer: Self-pay | Admitting: Family

## 2022-10-01 DIAGNOSIS — E785 Hyperlipidemia, unspecified: Secondary | ICD-10-CM

## 2022-10-01 LAB — CBC
Hematocrit: 39.5 % (ref 34.0–46.6)
Hemoglobin: 12.8 g/dL (ref 11.1–15.9)
MCH: 28.8 pg (ref 26.6–33.0)
MCHC: 32.4 g/dL (ref 31.5–35.7)
MCV: 89 fL (ref 79–97)
Platelets: 216 10*3/uL (ref 150–450)
RBC: 4.44 x10E6/uL (ref 3.77–5.28)
RDW: 14 % (ref 11.7–15.4)
WBC: 3.2 10*3/uL — ABNORMAL LOW (ref 3.4–10.8)

## 2022-10-01 LAB — CMP14+EGFR
ALT: 15 IU/L (ref 0–32)
AST: 25 IU/L (ref 0–40)
Albumin: 4.4 g/dL (ref 3.8–4.9)
Alkaline Phosphatase: 81 IU/L (ref 44–121)
BUN/Creatinine Ratio: 11 (ref 9–23)
BUN: 9 mg/dL (ref 6–24)
Bilirubin Total: 0.4 mg/dL (ref 0.0–1.2)
CO2: 27 mmol/L (ref 20–29)
Calcium: 10 mg/dL (ref 8.7–10.2)
Chloride: 103 mmol/L (ref 96–106)
Creatinine, Ser: 0.81 mg/dL (ref 0.57–1.00)
Globulin, Total: 2.2 g/dL (ref 1.5–4.5)
Glucose: 78 mg/dL (ref 70–99)
Potassium: 4.9 mmol/L (ref 3.5–5.2)
Sodium: 141 mmol/L (ref 134–144)
Total Protein: 6.6 g/dL (ref 6.0–8.5)
eGFR: 87 mL/min/{1.73_m2} (ref >59)

## 2022-10-01 LAB — LIPID PANEL
Chol/HDL Ratio: 2.8 ratio (ref 0.0–4.4)
Cholesterol, Total: 219 mg/dL — ABNORMAL HIGH (ref 100–199)
HDL: 77 mg/dL (ref >39)
LDL Chol Calc (NIH): 131 mg/dL — ABNORMAL HIGH (ref 0–99)
Triglycerides: 65 mg/dL (ref 0–149)
VLDL Cholesterol Cal: 11 mg/dL (ref 5–40)

## 2022-10-01 LAB — HEMOGLOBIN A1C
Est. average glucose Bld gHb Est-mCnc: 123 mg/dL
Hgb A1c MFr Bld: 5.9 % — ABNORMAL HIGH (ref 4.8–5.6)

## 2022-10-01 LAB — TSH: TSH: 0.844 u[IU]/mL (ref 0.450–4.500)

## 2022-10-01 MED ORDER — ROSUVASTATIN CALCIUM 10 MG PO TABS: 10.0000 mg | ORAL_TABLET | Freq: Every day | ORAL | 0 refills | Status: DC

## 2022-10-04 NOTE — Telephone Encounter (Signed)
Decrease Atenolol to 25 mg daily. Schedule follow-up appointment in 2 weeks or sooner if needed.   Magda Paganini, when you have patient's paperwork please give to me for completion. Thank you.

## 2022-10-05 NOTE — Telephone Encounter (Signed)
Please provide me with form for review/completion. Thank you.

## 2022-10-05 NOTE — Telephone Encounter (Signed)
Amy , Ethelene Browns has papers at his desk

## 2022-10-13 ENCOUNTER — Telehealth: Payer: Self-pay | Admitting: Family

## 2022-11-08 ENCOUNTER — Other Ambulatory Visit: Payer: 59

## 2022-11-08 DIAGNOSIS — E785 Hyperlipidemia, unspecified: Secondary | ICD-10-CM

## 2022-11-09 LAB — LIPID PANEL
Chol/HDL Ratio: 2.7 ratio (ref 0.0–4.4)
Cholesterol, Total: 181 mg/dL (ref 100–199)
HDL: 66 mg/dL (ref >39)
LDL Chol Calc (NIH): 102 mg/dL — ABNORMAL HIGH (ref 0–99)
Triglycerides: 68 mg/dL (ref 0–149)
VLDL Cholesterol Cal: 13 mg/dL (ref 5–40)

## 2022-11-15 ENCOUNTER — Other Ambulatory Visit: Payer: Self-pay

## 2022-11-15 DIAGNOSIS — B009 Herpesviral infection, unspecified: Secondary | ICD-10-CM

## 2022-11-15 MED ORDER — VALACYCLOVIR HCL 1 G PO TABS: ORAL_TABLET | ORAL | 2 refills | Status: AC

## 2022-11-15 NOTE — Telephone Encounter (Signed)
Medication refill request: valtrex  Last AEX:  06/03/22 Next AEX: not yet scheduled  Last MMG (if hormonal medication request): n/a Refill authorized: #[id number] with 2 rf pended for today

## 2022-11-19 ENCOUNTER — Other Ambulatory Visit: Payer: Self-pay

## 2022-11-19 DIAGNOSIS — I10 Essential (primary) hypertension: Secondary | ICD-10-CM

## 2022-11-19 MED ORDER — ENALAPRIL MALEATE 5 MG PO TABS: 5.0000 mg | ORAL_TABLET | Freq: Every day | ORAL | 2 refills | Status: DC

## 2022-11-19 MED ORDER — ATENOLOL 50 MG PO TABS: 50.0000 mg | ORAL_TABLET | Freq: Every day | ORAL | 2 refills | Status: DC

## 2022-11-25 NOTE — Telephone Encounter (Signed)
See routing comment(s) for message information.

## 2022-12-10 ENCOUNTER — Encounter: Payer: Self-pay | Admitting: Family

## 2022-12-13 ENCOUNTER — Other Ambulatory Visit: Payer: Self-pay | Admitting: Student

## 2022-12-13 DIAGNOSIS — I671 Cerebral aneurysm, nonruptured: Secondary | ICD-10-CM

## 2022-12-13 MED ORDER — CLOPIDOGREL BISULFATE 75 MG PO TABS: 37.5000 mg | ORAL_TABLET | ORAL | 6 refills | Status: AC

## 2022-12-13 NOTE — Telephone Encounter (Signed)
Interventional Radiology Brief Note:  Patient called requested refill of Plavix as she is completely out of her medication.   Rx called into [facility] on [address].   Patient aware.   Loyce Dys, MS RD PA-C

## 2023-01-06 ENCOUNTER — Ambulatory Visit: Payer: 59 | Admitting: Family

## 2023-01-25 ENCOUNTER — Ambulatory Visit: Payer: 59 | Admitting: Family

## 2023-01-25 ENCOUNTER — Encounter: Payer: Self-pay | Admitting: Family

## 2023-01-25 NOTE — Telephone Encounter (Signed)
Schedule appointment?

## 2023-02-14 ENCOUNTER — Ambulatory Visit: Payer: 59 | Admitting: Family

## 2023-02-14 VITALS — BP 108/71 | HR 65 | Temp 98.1°F | Ht 63.0 in | Wt 115.4 lb

## 2023-02-14 DIAGNOSIS — R7303 Prediabetes: Secondary | ICD-10-CM | POA: Diagnosis not present

## 2023-02-14 DIAGNOSIS — E785 Hyperlipidemia, unspecified: Secondary | ICD-10-CM

## 2023-02-14 DIAGNOSIS — I10 Essential (primary) hypertension: Secondary | ICD-10-CM | POA: Diagnosis not present

## 2023-02-14 MED ORDER — ENALAPRIL MALEATE 5 MG PO TABS: 5.0000 mg | ORAL_TABLET | Freq: Every day | ORAL | 0 refills | Status: DC

## 2023-02-14 MED ORDER — ATENOLOL 50 MG PO TABS: 50.0000 mg | ORAL_TABLET | Freq: Every day | ORAL | 0 refills | Status: DC

## 2023-02-14 NOTE — Progress Notes (Signed)
No concerns to discuss.  Has Flu vaccine already.    A1c 5.5

## 2023-02-14 NOTE — Progress Notes (Signed)
Patient ID: Sharon Mendoza, female    DOB: [birth year]  MRN: [id number]  CC: Chronic Conditions Follow-Up  Subjective: Sharon Mendoza is a 52 y.o. female who presents for chronic conditions follow-up.   Her concerns today include:  - Doing well on Atenolol and Enalapril, no issues/concerns. She does not complain of red flag symptoms such as but not limited to chest pain, shortness of breath, worst headache of life, nausea/vomiting.  - She is no longer taking cholesterol medication. Reports she is vegan.  - Prediabetes follow-up.  Patient Active Problem List   Diagnosis Date Noted   Prediabetes 05/21/2022   Ganglion cyst of flexor tendon sheath of finger, left 08/27/2021   Varicose veins of bilateral lower extremities with pain 04/30/2021   Family history of coronary artery disease 05/08/2020   Dyslipidemia 04/24/2020   Gastroesophageal reflux disease without esophagitis 04/24/2020   Pain in right knee 04/24/2020   History of cerebral aneurysm 04/17/2018   Genetic testing 06/08/2017   Family history of colon cancer    Family history of breast cancer    Essential hypertension 02/18/2015   Hyperlipidemia 02/18/2015   Chest pain 02/18/2015   Brain aneurysm 11/21/2014   Pain in joint, shoulder region 12/10/2013   Low back pain radiating to left lower extremity 12/10/2013   Aneurysm, cerebral, nonruptured 12/10/2013   Fibroid uterus 06/22/2012   Menorrhagia 06/22/2012   Dysmenorrhea 06/22/2012     Current Outpatient Medications on File Prior to Visit  Medication Sig Dispense Refill   aspirin 81 MG tablet Take 81 mg by mouth daily.     Cetirizine HCl 10 MG CAPS Take 1 capsule (10 mg total) by mouth daily. 15 capsule 0   valACYclovir (VALTREX) 1000 MG tablet Take 2 tablets po q 12 hours for 2 doses. 30 tablet 2   albuterol (PROVENTIL) (2.5 MG/3ML) 0.083% nebulizer solution Take 3 mLs (2.5 mg total) by nebulization every 6 (six) hours as needed for wheezing or shortness of  breath. (Patient not taking: Reported on 02/14/2023) 150 mL 2   albuterol (VENTOLIN HFA) 108 (90 Base) MCG/ACT inhaler Inhale 1-2 puffs into the lungs every 6 (six) hours as needed for wheezing or shortness of breath. (Patient not taking: Reported on 02/14/2023) 18 g 1   clopidogrel (PLAVIX) 75 MG tablet Take 0.5 tablets (37.5 mg total) by mouth every other day. 15 tablet 6   famotidine (PEPCID) 40 MG tablet Take 1 tablet (40 mg total) by mouth at bedtime. (Patient not taking: Reported on 02/14/2023) 30 tablet 0   rosuvastatin (CRESTOR) 10 MG tablet Take 1 tablet (10 mg total) by mouth daily. 90 tablet 0   sucralfate (CARAFATE) 1 GM/10ML suspension Take 10 mLs (1 g total) by mouth in the morning, at noon, and at bedtime. (Patient not taking: Reported on 09/30/2022) 414 mL 2   triamcinolone cream (KENALOG) 0.5 % Apply 1 application. topically 2 (two) times daily. (Patient not taking: Reported on 09/30/2022) 30 g 0   [DISCONTINUED] clopidogrel (PLAVIX) 75 MG tablet Take 0.5 tablets (37.5 mg total) by mouth every other day. 15 tablet 2   No current facility-administered medications on file prior to visit.    Allergies  Allergen Reactions   Atorvastatin Hives and Itching    Social History   Socioeconomic History   Marital status: Single    Spouse name: Not on file   Number of children: 1   Years of education: BS   Highest education level: Professional school degree (e.g., MD,  DDS, DVM, JD)  Occupational History   Occupation: PHARMACIST    Employer: [employer]  Tobacco Use   Smoking status: Never    Passive exposure: Never   Smokeless tobacco: Never  Vaping Use   Vaping status: Never Used  Substance and Sexual Activity   Alcohol use: No    Alcohol/week: 0.0 standard drinks of alcohol   Drug use: No   Sexual activity: Yes    Partners: Male  Other Topics Concern   Not on file  Social History Narrative   Not on file   Social Determinants of Health   Financial Resource Strain:  Low Risk  (02/14/2023)   Overall Financial Resource Strain (CARDIA)    Difficulty of Paying Living Expenses: Not hard at all  Food Insecurity: No Food Insecurity (02/14/2023)   Hunger Vital Sign    Worried About Running Out of Food in the Last Year: Never true    Ran Out of Food in the Last Year: Never true  Transportation Needs: No Transportation Needs (02/14/2023)   PRAPARE - Administrator, Civil Service (Medical): No    Lack of Transportation (Non-Medical): No  Physical Activity: Insufficiently Active (02/14/2023)   Exercise Vital Sign    Days of Exercise per Week: 1 day    Minutes of Exercise per Session: 10 min  Stress: No Stress Concern Present (02/14/2023)   [employer] of Occupational Health - Occupational Stress Questionnaire    Feeling of Stress : Not at all  Social Connections: Moderately Integrated (02/14/2023)   Social Connection and Isolation Panel [NHANES]    Frequency of Communication with Friends and Family: More than three times a week    Frequency of Social Gatherings with Friends and Family: Twice a week    Attends Religious Services: More than 4 times per year    Active Member of Clubs or Organizations: Yes    Attends Engineer, structural: More than 4 times per year    Marital Status: Divorced  Catering manager Violence: Not on file    Family History  Problem Relation Age of Onset   Hypertension Mother    Liver disease Mother    Colon cancer Father 37   Throat cancer Father 70   Heart disease Brother    Aneurysm Brother    Heart attack Brother    Asthma Son    Aneurysm Sister    Aneurysm Brother    Aneurysm Sister    Heart attack Sister    Aneurysm Sister 18   Stroke Maternal Grandmother    Breast cancer Paternal Aunt 65       metastatic   Breast cancer Paternal Aunt 49       metastatic   Colon polyps Brother    Breast cancer Cousin    Breast cancer Paternal Aunt     Past Surgical History:  Procedure Laterality  Date   BREAST BIOPSY     age 6 left breast   CARDIOVASCULAR STRESS TEST  05/19/11   05/19/11 Nuclear stress test ([facility]): Normal study, EF 87%   CESAREAN SECTION  09/2005   CT CORONARY CA SCORING  06/23/12   06/23/12 Cardiac CTA ([facility]): Ca score 0. Right dominant. Normal coronaries.   INTRACRANIAL ANEURYSM REPAIR     MYOMECTOMY  2005   RADIOLOGY WITH ANESTHESIA N/A 11/21/2014   Procedure: Embolization;  Surgeon: Julieanne Cotton, MD;  Location: [facility] OR;  Service: Radiology;  Laterality: N/A;   US ECHOCARDIOGRAPHY  05/18/11  05/18/11 Echo ([facility]): NL chamber size, LV systolic function, wall motion. LVEF 77%. Mild LVH. Mild MR/TR. NL PAP.    ROS: Review of Systems Negative except as stated above  PHYSICAL EXAM: BP 108/71   Pulse 65   Temp 98.1 F (36.7 C) (Oral)   Ht 5\' 3"  (1.6 m)   Wt 115 lb 6.4 oz (52.3 kg)   LMP  (LMP Unknown)   SpO2 98%   BMI 20.44 kg/m   Physical Exam HENT:     Head: Normocephalic and atraumatic.     Nose: Nose normal.     Mouth/Throat:     Mouth: Mucous membranes are moist.     Pharynx: Oropharynx is clear.  Eyes:     Extraocular Movements: Extraocular movements intact.     Conjunctiva/sclera: Conjunctivae normal.     Pupils: Pupils are equal, round, and reactive to light.  Cardiovascular:     Rate and Rhythm: Normal rate and regular rhythm.     Pulses: Normal pulses.     Heart sounds: Normal heart sounds.  Pulmonary:     Effort: Pulmonary effort is normal.     Breath sounds: Normal breath sounds.  Musculoskeletal:        General: Normal range of motion.     Cervical back: Normal range of motion and neck supple.  Neurological:     General: No focal deficit present.     Mental Status: She is alert and oriented to person, place, and time.  Psychiatric:        Mood and Affect: Mood normal.        Behavior: Behavior normal.     ASSESSMENT AND PLAN: 1. Primary hypertension - Continue Atenolol and Enalapril as  prescribed.  - Routine screening.  - Counseled on blood pressure goal of less than 130/80, low-sodium, DASH diet, medication compliance, and 150 minutes of moderate intensity exercise per week as tolerated. Counseled on medication adherence and adverse effects. - Follow-up with primary provider in 3 months or sooner if needed.  - Basic Metabolic Panel - atenolol (TENORMIN) 50 MG tablet; Take 1 tablet (50 mg total) by mouth daily.  Dispense: 90 tablet; Refill: 0 - enalapril (VASOTEC) 5 MG tablet; Take 1 tablet (5 mg total) by mouth daily.  Dispense: 90 tablet; Refill: 0  2. Prediabetes - Routine screening.  - POCT glycosylated hemoglobin (Hb A1C); Future  3. Hyperlipidemia, unspecified hyperlipidemia type - Routine screening.  - Lipid panel   Patient was given the opportunity to ask questions.  Patient verbalized understanding of the plan and was able to repeat key elements of the plan. Patient was given clear instructions to go to Emergency Department or return to medical center if symptoms don't improve, worsen, or new problems develop.The patient verbalized understanding.   Orders Placed This Encounter  Procedures   Basic Metabolic Panel   Lipid panel   POCT glycosylated hemoglobin (Hb A1C)     Requested Prescriptions   Signed Prescriptions Disp Refills   atenolol (TENORMIN) 50 MG tablet 90 tablet 0    Sig: Take 1 tablet (50 mg total) by mouth daily.   enalapril (VASOTEC) 5 MG tablet 90 tablet 0    Sig: Take 1 tablet (5 mg total) by mouth daily.    Return in about 3 months (around 05/17/2023) for Follow-Up or next available chronic conditions.  Rema Fendt, NP

## 2023-02-16 LAB — BASIC METABOLIC PANEL
BUN/Creatinine Ratio: 11 (ref 9–23)
BUN: 9 mg/dL (ref 6–24)
CO2: 25 mmol/L (ref 20–29)
Calcium: 10.1 mg/dL (ref 8.7–10.2)
Chloride: 106 mmol/L (ref 96–106)
Creatinine, Ser: 0.8 mg/dL (ref 0.57–1.00)
Glucose: 77 mg/dL (ref 70–99)
Potassium: 5 mmol/L (ref 3.5–5.2)
Sodium: 143 mmol/L (ref 134–144)
eGFR: 88 mL/min/{1.73_m2} (ref >59)

## 2023-02-16 LAB — LIPID PANEL
Chol/HDL Ratio: 2.6 {ratio} (ref 0.0–4.4)
Cholesterol, Total: 187 mg/dL (ref 100–199)
HDL: 72 mg/dL (ref >39)
LDL Chol Calc (NIH): 100 mg/dL — ABNORMAL HIGH (ref 0–99)
Triglycerides: 83 mg/dL (ref 0–149)
VLDL Cholesterol Cal: 15 mg/dL (ref 5–40)

## 2023-02-23 LAB — HM COLONOSCOPY

## 2023-03-01 ENCOUNTER — Telehealth: Payer: Self-pay | Admitting: Family

## 2023-05-27 ENCOUNTER — Telehealth: Payer: Self-pay | Admitting: Cardiovascular Disease

## 2023-05-27 ENCOUNTER — Ambulatory Visit: Payer: Self-pay | Admitting: Family

## 2023-05-27 ENCOUNTER — Encounter: Payer: Self-pay | Admitting: Family

## 2023-05-27 NOTE — Telephone Encounter (Signed)
   Pre-operative Risk Assessment    Patient Name: Sharon Mendoza  DOB: [birth year] MRN: [id number]   Date of last office visit: 05/08/2020 Date of next office visit:     Request for Surgical Clearance    Procedure:   Implant Placement for tooth #19  Date of Surgery:  Clearance TBD                                Surgeon:  Dr. Leonel Ramsay Group or Practice Name:  [facility] Phone number:  [phone] Fax number:  [phone]   Type of Clearance Requested:   - Medical    Type of Anesthesia:   SIB   Additional requests/questions:   5  Signed, Filomena Jungling   05/27/2023, 11:03 AM

## 2023-05-27 NOTE — Telephone Encounter (Signed)
 Patient identification verified by 2 forms. Marilynn Rail, RN    Called and spoke to patient  Patient states:   -concerned about blood pressure   -does not typically check BP   -started checking this week because dentist noted it was high   -she has been having headaches and blurred vision  -Takes Atenolol 50mg  and Enalapril 5mg  every morning   -3/5 BP: 159/80 1 hour after taking Medications (at dentist office)  -3/5 BP: 155/78   -3/6: 144/83 Hr: 69 (evening)   -3/7: 106/70 Hr: 69 at 7:30am   -3/7: 148/83 Hr: 84 at 9:30am (took medication)   -unsure if BP there is pattern of BP increasing in Evening  -has OV 3/11 at 9:10am with PCP office  Patient denies:   -Chest pain   -SOB/difficulty breathing  Advised patient:   -Check BP this morning (reviewed instruction on how to check BP)   -If BP elevated this evening start taking Atenolol in the morning and Enalapril in evening   -Check BP 1-2  hours after medication, keep log   -follow up with PCP at 3/11 office visit   -follow PCP recommendations, follow up with Dr. Allyson Sabal at 3/25 OV  Reviewed ED warning signs/precautions  Patient verbalized understanding, no questions at this time

## 2023-05-27 NOTE — Telephone Encounter (Signed)
 This RN made first attempt to contact pt with no answer. A voicemail was left with a request for call back. Office number provided.  Copied from CRM [id number]. Topic: Clinical - Red Word Triage >> May 27, 2023  9:52 AM Alessandra Bevels wrote: Red Word that prompted transfer to Nurse Triage: Patient is calling to report BP readings of 150/80 with headache,  159/80, today readings of [phone]

## 2023-05-27 NOTE — Telephone Encounter (Signed)
 I called and spoke with patient.  Patient has a appointment on Tuesday March 11,2025 at another office per patient.

## 2023-05-27 NOTE — Telephone Encounter (Signed)
 STAT if HR is under 50 or over 120 (normal HR is 60-100 beats per minute)  What is your heart rate?   HR 84 around 9:00 am  Do you have a log of your heart rate readings (document readings)?   Tuesday - 159/80 HR 54       155/ ??(patient unsure)       123/119       148/80  HR in the 60's (last night)       106/74  HR 60       148/83  HR 84 (this morning at 9:00 am)  Do you have any other symptoms?  Headache and blurred vision  Patient stated on Tuesday she started having headaches and she went to her dentist on Wednesday and her readings were BP 159/80  HR 54.  Patient is concerned regarding her BP readings and wants a call back to discuss next steps.

## 2023-05-27 NOTE — Telephone Encounter (Signed)
 Chief Complaint: HTN Symptoms: varying blood pressure readings, headache, slight blurry vision Frequency: since Wednesday 3/5 Pertinent Negatives: Patient denies lightheadedness, dizziness, N/V, CP, SOB, weakness Disposition: [] ED /[] Urgent Care (no appt availability in office) / [x] Appointment(In office/virtual)/ []  [facility]/ [] Home Care/ [] Refused Recommended Disposition /[] [facility]/ []  Follow-up with PCP Additional Notes: Pt reports varying blood pressure readings. Pt went to a dentist appt on Wednesday 3/5 and had her BP checked. In the dentist office her BP was "159/"something." Yesterday 3/6 her Bps were 119/74 and 133/80. Today this AM 3/7 her BP was 106/74. Later today she rechecked it and it was 148/83. Pt reports continuing to take atenolol and enalapril with no missed doses. Pt states she wears glasses when working but reports some blurry vision and a 5/10 headache. No dizziness, no lightheadedness, no CP, no SOB, no N/V, no weakness. Per protocol pt scheduled in the office Tuesday 3/11 at 0910 with Bertram Denver as Ricky Stabs does not have availability until the end of March. RN advised pt if she develops sudden, severe headache, CP, SOB, dizziness, lightheadedness, or one-sided weakness that she needs to call 911, but for other new or worsening symptoms she should call us back. Pt verbalized understanding.   After the call, RN noticed the location for Tuesday's appt is the [facility] [employer]. RN called the pt back and left a voicemail and callback number alerting her to the location of that appt.     Reason for Disposition  [1] Systolic BP  >= 130 OR Diastolic >= 80 AND [2] taking BP medications  Answer Assessment - Initial Assessment Questions 1. BLOOD PRESSURE: "What is the blood pressure?" "Did you take at least two measurements 5 minutes apart?"     BP last night was 119/74, then it was 133/80, pulse was in the 60s. 106/74 this AM  with a HR of 60. Now it is 148/83 and a pulse of 84. Highest was 159/something on Wednesday at the dentist 2. ONSET: "When did you take your blood pressure?"     Today 3. HOW: "How did you take your blood pressure?" (e.g., automatic home BP monitor, visiting nurse)     Automatic 4. HISTORY: "Do you have a history of high blood pressure?"     Yes 5. MEDICINES: "Are you taking any medicines for blood pressure?" "Have you missed any doses recently?"     Taking atenolol and enalapril, no missed doses 6. OTHER SYMPTOMS: "Do you have any symptoms?" (e.g., blurred vision, chest pain, difficulty breathing, headache, weakness)     "I wear glasses when I am working but I have had slight blurry vision this week and headache." 5/10 pain, doesn't normally have headaches. No CP or SOB. No dizziness.  Protocols used: Blood Pressure - High-A-AH

## 2023-05-27 NOTE — Telephone Encounter (Signed)
   Patient Name: Sharon Mendoza  DOB: 07/10/1970 MRN: [id number]  Primary Cardiologist: Nanetta Batty, MD  Chart reviewed as part of pre-operative protocol coverage.   Simple dental extractions (i.e. 1-2 teeth) are considered low risk procedures per guidelines and generally do not require any specific cardiac clearance. It is also generally accepted that for simple extractions and dental cleanings, there is no need to interrupt blood thinner therapy.   SBE prophylaxis is not required for the patient from a cardiac standpoint.  I will route this recommendation to the requesting party via Epic fax function and remove from pre-op pool.  Please call with questions.  Napoleon Form, Leodis Rains, NP 05/27/2023, 11:14 AM

## 2023-05-27 NOTE — Telephone Encounter (Signed)
 3rd attempt, called pt, LVMTCB to make sure pt knew about appt location and pt ok with this and if has any further questions or needed location details. Will route to practice.

## 2023-05-27 NOTE — Telephone Encounter (Signed)
 Reason for Disposition . [1] Systolic BP  >= 130 OR Diastolic >= 80 AND [2] taking BP medications  Answer Assessment - Initial Assessment Questions 1. BLOOD PRESSURE: "What is the blood pressure?" "Did you take at least two measurements 5 minutes apart?"     BP last night was 119/74, then it was 133/80, pulse was in the 60s. 106/74 this AM with a HR of 60. Now it is 148/83 and a pulse of 84. Highest was 159/"something on Wednesday at the dentist." Most recent BP is 148/83 2. ONSET: "When did you take your blood pressure?"     Today 3. HOW: "How did you take your blood pressure?" (e.g., automatic home BP monitor, visiting nurse)     Automatic 4. HISTORY: "Do you have a history of high blood pressure?"     Yes 5. MEDICINES: "Are you taking any medicines for blood pressure?" "Have you missed any doses recently?"     Taking atenolol and enalapril, no missed doses 6. OTHER SYMPTOMS: "Do you have any symptoms?" (e.g., blurred vision, chest pain, difficulty breathing, headache, weakness)     "I wear glasses when I am working, but I have had slight blurry vision this week and headache." 5/10 pain, doesn't normally have headaches. No CP or SOB. No dizziness, lightheadedness, N/V, CP, SOB.  Protocols used: Blood Pressure - High-A-AH

## 2023-05-30 ENCOUNTER — Ambulatory Visit: Payer: Self-pay | Admitting: Family

## 2023-05-30 NOTE — Telephone Encounter (Signed)
  Chief Complaint: Fatigue, concerned for blood pressure readings. Symptoms: fatigue, tired Frequency: Since last week Pertinent Negatives: Patient denies headache, CP, SOB Disposition: [] ED /[] Urgent Care (no appt availability in office) / [x] Appointment(In office/virtual)/ []  [facility]/ [] Home Care/ [] Refused Recommended Disposition /[]  Mobile Bus/ []  Follow-up with PCP Additional Notes: patient calling with concerns for fatigue and her blood pressure readings. Patient reports that SBP this morning was in the low 100s. Patient reports taking her medication-Atenolol and Vasotec. Patient took her BP again and SBP was 99. Patient had called on 05/27/2023 and was triaged for same reasoning. Patient was setup for an appointment for 05/31/2023 at an alternative office as her provider has no availability until the end of March. Patient was primarily calling to see if her provider had an opening today. Per protocol, appointment already setup for 05/31/2023 is an appropriate recommendation. Patient verbalized understanding of plan and all questions answered.     Copied from CRM [id number]. Topic: Clinical - Red Word Triage >> May 30, 2023  9:51 AM Elle L wrote: Red Word that prompted transfer to Nurse Triage: The patient was triaged on 3/7 for high blood pressure. However, the patient advised that it is worsening and continues to go up and down and she is extremely fatigued and weak from it today. Reason for Disposition  Taking a medicine that could cause weakness (e.g., blood pressure medications, diuretics)  Answer Assessment - Initial Assessment Questions 1. DESCRIPTION: "Describe how you are feeling."     Patient reports feeling tired and concerned about her BP. 2. SEVERITY: "How bad is it?"  "Can you stand and walk?"   - MILD (0-3): Feels weak or tired, but does not interfere with work, school or normal activities.   - MODERATE (4-7): Able to stand and walk; weakness interferes  with work, school, or normal activities.   - SEVERE (8-10): Unable to stand or walk; unable to do usual activities.     Mild 3. ONSET: "When did these symptoms begin?" (e.g., hours, days, weeks, months)     Started last week 4. CAUSE: "What do you think is causing the weakness or fatigue?" (e.g., not drinking enough fluids, medical problem, trouble sleeping)     Believes BP is the concern 5. NEW MEDICINES:  "Have you started on any new medicines recently?" (e.g., opioid pain medicines, benzodiazepines, muscle relaxants, antidepressants, antihistamines, neuroleptics, beta blockers)     No new medication 6. OTHER SYMPTOMS: "Do you have any other symptoms?" (e.g., chest pain, fever, cough, SOB, vomiting, diarrhea, bleeding, other areas of pain)     No other symptoms  Protocols used: Weakness (Generalized) and Fatigue-A-AH

## 2023-05-30 NOTE — Telephone Encounter (Signed)
 noted

## 2023-05-31 ENCOUNTER — Ambulatory Visit: Attending: Nurse Practitioner | Admitting: Nurse Practitioner

## 2023-05-31 VITALS — BP 130/79 | HR 59 | Ht 63.0 in | Wt 115.4 lb

## 2023-05-31 DIAGNOSIS — R7303 Prediabetes: Secondary | ICD-10-CM

## 2023-05-31 DIAGNOSIS — R030 Elevated blood-pressure reading, without diagnosis of hypertension: Secondary | ICD-10-CM

## 2023-05-31 DIAGNOSIS — R5383 Other fatigue: Secondary | ICD-10-CM

## 2023-05-31 DIAGNOSIS — D72819 Decreased white blood cell count, unspecified: Secondary | ICD-10-CM

## 2023-05-31 DIAGNOSIS — I10 Essential (primary) hypertension: Secondary | ICD-10-CM

## 2023-05-31 MED ORDER — ENALAPRIL MALEATE 5 MG PO TABS: 5.0000 mg | ORAL_TABLET | Freq: Every day | ORAL | 1 refills | Status: DC

## 2023-05-31 NOTE — Progress Notes (Signed)
 Assessment & Plan:  Sharon Mendoza was seen today for hypertension.  Diagnoses and all orders for this visit:  Elevated blood pressure reading -     Urinalysis, Complete -     Iron, TIBC and Ferritin Panel -     Magnesium  Prediabetes -     Hemoglobin A1c  Fatigue, unspecified type -     VITAMIN D 25 Hydroxy (Vit-D Deficiency, Fractures) -     Iron, TIBC and Ferritin Panel -     CBC with Differential -     Magnesium  Leukopenia, unspecified type -     CBC with Differential  Primary hypertension -     enalapril (VASOTEC) 5 MG tablet; Take 1 tablet (5 mg total) by mouth daily. Continue all antihypertensives as prescribed.  Reminded to bring in blood pressure log for follow  up appointment.  RECOMMENDATIONS: DASH/Mediterranean Diets are healthier choices for HTN.      Patient has been counseled on age-appropriate routine health concerns for screening and prevention. These are reviewed and up-to-date. Referrals have been placed accordingly. Immunizations are up-to-date or declined.    Subjective:   Chief Complaint  Patient presents with   Hypertension    Sharon Mendoza 53 y.o. female presents to office today for review of labile BP readings and with concerns of persistent fatigue. She is a patient of [name].   Ms. Osterloh has been experiencing readings on her home BP monitor of low heart rate in the 50s and an elevated reading of SBP in the 150s after a visit to the dentist office recently. She has a log of her readings  On 05-25-23 after going to the dentist she had the following readings: 159/82 HR 60 155/73 HR 54 154/75 HR 59   Other readings as follows and averaging 120/70s and HR 60-70s. She has a few outliers however overall I recommended to continue her current BP medications atenolol and enalapril. She is currently taking 25 mg of atenolol and not 50 mg which was agreed upon between [name] and her PCP.    Fatigue She has been postmenopausal for several  years. Does not feel current symptoms of fatigue are related to her hormones. She does not endorse any shortness of breath with activity or worsening dyspnea.    Review of Systems  Constitutional:  Positive for malaise/fatigue. Negative for fever and weight loss.  HENT: Negative.  Negative for nosebleeds.   Eyes: Negative.  Negative for blurred vision, double vision and photophobia.  Respiratory: Negative.  Negative for cough and shortness of breath.   Cardiovascular: Negative.  Negative for chest pain, palpitations and leg swelling.  Gastrointestinal: Negative.  Negative for heartburn, nausea and vomiting.  Musculoskeletal: Negative.  Negative for myalgias.  Neurological: Negative.  Negative for dizziness, focal weakness, seizures and headaches.  Psychiatric/Behavioral: Negative.  Negative for suicidal ideas.     Past Medical History:  Diagnosis Date   Abnormal pap    Cerebral aneurysm without rupture    Chest pain radiating to arm    Family history of breast cancer    Family history of colon cancer    Family history of heart disease    Gastroesophageal reflux    Gestational diabetes    Hyperlipidemia    Hypertension    Mitral valve regurgitation    PONV (postoperative nausea and vomiting)    Prediabetes     Past Surgical History:  Procedure Laterality Date   BREAST BIOPSY     age 31  left breast   CARDIOVASCULAR STRESS TEST  05/19/11   05/19/11 Nuclear stress test ([facility]): Normal study, EF 87%   CESAREAN SECTION  09/2005   CT CORONARY CA SCORING  06/23/12   06/23/12 Cardiac CTA ([facility]): Ca score 0. Right dominant. Normal coronaries.   INTRACRANIAL ANEURYSM REPAIR     MYOMECTOMY  2005   RADIOLOGY WITH ANESTHESIA N/A 11/21/2014   Procedure: Embolization;  Surgeon: Julieanne Cotton, MD;  Location: [facility] OR;  Service: Radiology;  Laterality: N/A;   US ECHOCARDIOGRAPHY  05/18/11   05/18/11 Echo ([facility]): NL chamber size, LV systolic function, wall  motion. LVEF 77%. Mild LVH. Mild MR/TR. NL PAP.    Family History  Problem Relation Age of Onset   Hypertension Mother    Liver disease Mother    Colon cancer Father 45   Throat cancer Father 24   Heart disease Brother    Aneurysm Brother    Heart attack Brother    Asthma Son    Aneurysm Sister    Aneurysm Brother    Aneurysm Sister    Heart attack Sister    Aneurysm Sister 18   Stroke Maternal Grandmother    Breast cancer Paternal Aunt 37       metastatic   Breast cancer Paternal Aunt 28       metastatic   Colon polyps Brother    Breast cancer Cousin    Breast cancer Paternal Aunt     Social History Reviewed with no changes to be made today.   Outpatient Medications Prior to Visit  Medication Sig Dispense Refill   aspirin 81 MG tablet Take 81 mg by mouth daily.     atenolol (TENORMIN) 50 MG tablet Take 1 tablet (50 mg total) by mouth daily. 90 tablet 0   Cetirizine HCl 10 MG CAPS Take 1 capsule (10 mg total) by mouth daily. 15 capsule 0   clopidogrel (PLAVIX) 75 MG tablet Take 0.5 tablets (37.5 mg total) by mouth every other day. 15 tablet 6   valACYclovir (VALTREX) 1000 MG tablet Take 2 tablets po q 12 hours for 2 doses. 30 tablet 2   enalapril (VASOTEC) 5 MG tablet Take 1 tablet (5 mg total) by mouth daily. 90 tablet 0   famotidine (PEPCID) 40 MG tablet Take 1 tablet (40 mg total) by mouth at bedtime. (Patient not taking: Reported on 05/31/2023) 30 tablet 0   albuterol (PROVENTIL) (2.5 MG/3ML) 0.083% nebulizer solution Take 3 mLs (2.5 mg total) by nebulization every 6 (six) hours as needed for wheezing or shortness of breath. (Patient not taking: Reported on 05/31/2023) 150 mL 2   albuterol (VENTOLIN HFA) 108 (90 Base) MCG/ACT inhaler Inhale 1-2 puffs into the lungs every 6 (six) hours as needed for wheezing or shortness of breath. (Patient not taking: Reported on 05/31/2023) 18 g 1   rosuvastatin (CRESTOR) 10 MG tablet Take 1 tablet (10 mg total) by mouth daily. 90 tablet 0    sucralfate (CARAFATE) 1 GM/10ML suspension Take 10 mLs (1 g total) by mouth in the morning, at noon, and at bedtime. (Patient not taking: Reported on 09/30/2022) 414 mL 2   triamcinolone cream (KENALOG) 0.5 % Apply 1 application. topically 2 (two) times daily. (Patient not taking: Reported on 05/31/2023) 30 g 0   No facility-administered medications prior to visit.    Allergies  Allergen Reactions   Atorvastatin Hives and Itching       Objective:    BP 130/79 (BP Location:  Left Arm, Patient Position: Sitting, Cuff Size: Normal)   Pulse (!) 59   Ht 5\' 3"  (1.6 m)   Wt 115 lb 6.4 oz (52.3 kg)   LMP  (LMP Unknown)   SpO2 100%   BMI 20.44 kg/m  Wt Readings from Last 3 Encounters:  05/31/23 115 lb 6.4 oz (52.3 kg)  02/14/23 115 lb 6.4 oz (52.3 kg)  09/30/22 124 lb 3.2 oz (56.3 kg)    Physical Exam Vitals and nursing note reviewed.  Constitutional:      Appearance: She is well-developed.  HENT:     Head: Normocephalic and atraumatic.  Cardiovascular:     Rate and Rhythm: Regular rhythm. Bradycardia present.     Heart sounds: Normal heart sounds. No murmur heard.    No friction rub. No gallop.  Pulmonary:     Effort: Pulmonary effort is normal. No tachypnea or respiratory distress.     Breath sounds: Normal breath sounds. No decreased breath sounds, wheezing, rhonchi or rales.  Chest:     Chest wall: No tenderness.  Abdominal:     General: Bowel sounds are normal.     Palpations: Abdomen is soft.  Musculoskeletal:        General: Normal range of motion.     Cervical back: Normal range of motion.  Skin:    General: Skin is warm and dry.  Neurological:     Mental Status: She is alert and oriented to person, place, and time.     Coordination: Coordination normal.  Psychiatric:        Behavior: Behavior normal. Behavior is cooperative.        Thought Content: Thought content normal.        Judgment: Judgment normal.          Patient has been counseled extensively  about nutrition and exercise as well as the importance of adherence with medications and regular follow-up. The patient was given clear instructions to go to ER or return to medical center if symptoms don't improve, worsen or new problems develop. The patient verbalized understanding.   Follow-up: Return if symptoms worsen or fail to improve.   Claiborne Rigg, FNP-BC [facility] [place], [place] [phone]   05/31/2023, 12:45 PM

## 2023-05-31 NOTE — Telephone Encounter (Signed)
 Hi Sharon Mendoza, Are you able to look into this

## 2023-06-01 ENCOUNTER — Encounter: Payer: Self-pay | Admitting: Nurse Practitioner

## 2023-06-01 LAB — CBC WITH DIFFERENTIAL/PLATELET
Basophils Absolute: 0 10*3/uL (ref 0.0–0.2)
Basos: 1 %
EOS (ABSOLUTE): 0.2 10*3/uL (ref 0.0–0.4)
Eos: 3 %
Hematocrit: 42.5 % (ref 34.0–46.6)
Hemoglobin: 13.6 g/dL (ref 11.1–15.9)
Immature Grans (Abs): 0 10*3/uL (ref 0.0–0.1)
Immature Granulocytes: 0 %
Lymphocytes Absolute: 2 10*3/uL (ref 0.7–3.1)
Lymphs: 35 %
MCH: 28.5 pg (ref 26.6–33.0)
MCHC: 32 g/dL (ref 31.5–35.7)
MCV: 89 fL (ref 79–97)
Monocytes Absolute: 0.5 10*3/uL (ref 0.1–0.9)
Monocytes: 9 %
Neutrophils Absolute: 3.1 10*3/uL (ref 1.4–7.0)
Neutrophils: 52 %
Platelets: 228 10*3/uL (ref 150–450)
RBC: 4.77 x10E6/uL (ref 3.77–5.28)
RDW: 13.6 % (ref 11.7–15.4)
WBC: 5.8 10*3/uL (ref 3.4–10.8)

## 2023-06-01 LAB — HEMOGLOBIN A1C
Est. average glucose Bld gHb Est-mCnc: 114 mg/dL
Hgb A1c MFr Bld: 5.6 % (ref 4.8–5.6)

## 2023-06-01 LAB — IRON,TIBC AND FERRITIN PANEL
Ferritin: 56 ng/mL (ref 15–150)
Iron Saturation: 37 % (ref 15–55)
Iron: 104 ug/dL (ref 27–159)
Total Iron Binding Capacity: 284 ug/dL (ref 250–450)
UIBC: 180 ug/dL (ref 131–425)

## 2023-06-01 LAB — URINALYSIS, COMPLETE
Bilirubin, UA: NEGATIVE
Glucose, UA: NEGATIVE
Ketones, UA: NEGATIVE
Leukocytes,UA: NEGATIVE
Nitrite, UA: NEGATIVE
Protein,UA: NEGATIVE
RBC, UA: NEGATIVE
Specific Gravity, UA: 1.017 (ref 1.005–1.030)
Urobilinogen, Ur: 0.2 mg/dL (ref 0.2–1.0)
pH, UA: 5.5 (ref 5.0–7.5)

## 2023-06-01 LAB — MICROSCOPIC EXAMINATION
Bacteria, UA: NONE SEEN
Casts: NONE SEEN /LPF
RBC, Urine: NONE SEEN /HPF (ref 0–2)
WBC, UA: NONE SEEN /HPF (ref 0–5)

## 2023-06-01 LAB — VITAMIN D 25 HYDROXY (VIT D DEFICIENCY, FRACTURES): Vit D, 25-Hydroxy: 40.3 ng/mL (ref 30.0–100.0)

## 2023-06-01 LAB — MAGNESIUM: Magnesium: 2.1 mg/dL (ref 1.6–2.3)

## 2023-06-05 ENCOUNTER — Encounter: Payer: Self-pay | Admitting: Nurse Practitioner

## 2023-06-07 ENCOUNTER — Ambulatory Visit (INDEPENDENT_AMBULATORY_CARE_PROVIDER_SITE_OTHER): Payer: 59 | Admitting: Obstetrics and Gynecology

## 2023-06-07 ENCOUNTER — Encounter: Payer: Self-pay | Admitting: Obstetrics and Gynecology

## 2023-06-07 VITALS — BP 104/62 | HR 64 | Temp 98.2°F | Ht 64.5 in | Wt 115.0 lb

## 2023-06-07 DIAGNOSIS — Z1331 Encounter for screening for depression: Secondary | ICD-10-CM

## 2023-06-07 DIAGNOSIS — Z01419 Encounter for gynecological examination (general) (routine) without abnormal findings: Secondary | ICD-10-CM | POA: Insufficient documentation

## 2023-06-07 DIAGNOSIS — Z9189 Other specified personal risk factors, not elsewhere classified: Secondary | ICD-10-CM | POA: Insufficient documentation

## 2023-06-07 NOTE — Patient Instructions (Signed)

## 2023-06-07 NOTE — Progress Notes (Signed)
 53 y.o. G1P1 female with history of brain aneurysm, at risk for breast cancer/family history of cancer (TC 26%, negative genetic testing in 2019, annual MRI and mammogram suggested), hypertension, history of fibroids status post myomectomy (2005) here for annual exam. Single.  No LMP recorded (lmp unknown). Patient is postmenopausal.   Notes urinary stream is not longer straight. No difficulty with cost, has had difficulty keeping up with screening due to work schedule.  Abnormal bleeding: none Pelvic discharge or pain: none Breast mass, nipple discharge or skin changes : none Last PAP:     Component Value Date/Time   DIAGPAP  04/24/2020 0957    - Negative for Intraepithelial Lesions or Malignancy (NILM)   DIAGPAP - Benign reactive/reparative changes 04/24/2020 0957   HPVHIGH Negative 04/24/2020 0957   ADEQPAP  04/24/2020 0957    Satisfactory for evaluation. The presence or absence of an   ADEQPAP  04/24/2020 0957    endocervical/transformation zone component cannot be determined because   ADEQPAP of atrophy. 04/24/2020 0957   Last mammogram: 07/22/2022 BI-RADS 1, density D, last breast MRI 2022 Last colonoscopy: 02/23/2023 every 5 years Sexually active: no  Exercising: Walking, 3x/wk, planning to start strength training Diet: vegan, taking Vit D, B12, C, calcium fortified milk alternative Smoker: no  GYN HISTORY: Myomectomy, 2005  OB History  Gravida Para Term Preterm AB Living  1 1    1   SAB IAB Ectopic Multiple Live Births          # Outcome Date GA Lbr Len/2nd Weight Sex Type Anes PTL Lv  1 Para             Past Medical History:  Diagnosis Date   Abnormal pap    Cerebral aneurysm without rupture    Chest pain radiating to arm    Family history of breast cancer    Family history of colon cancer    Family history of heart disease    Gastroesophageal reflux    Gestational diabetes    Hyperlipidemia    Hypertension    Mitral valve regurgitation    PONV  (postoperative nausea and vomiting)    Prediabetes     Past Surgical History:  Procedure Laterality Date   BREAST BIOPSY     age 67 left breast   CARDIOVASCULAR STRESS TEST  05/19/11   05/19/11 Nuclear stress test ([facility]): Normal study, EF 87%   CESAREAN SECTION  09/2005   CT CORONARY CA SCORING  06/23/12   06/23/12 Cardiac CTA ([facility]): Ca score 0. Right dominant. Normal coronaries.   INTRACRANIAL ANEURYSM REPAIR     MYOMECTOMY  2005   RADIOLOGY WITH ANESTHESIA N/A 11/21/2014   Procedure: Embolization;  Surgeon: Julieanne Cotton, MD;  Location: [facility] OR;  Service: Radiology;  Laterality: N/A;   US ECHOCARDIOGRAPHY  05/18/11   05/18/11 Echo ([facility]): NL chamber size, LV systolic function, wall motion. LVEF 77%. Mild LVH. Mild MR/TR. NL PAP.    Current Outpatient Medications on File Prior to Visit  Medication Sig Dispense Refill   Aspirin 81 MG CAPS      aspirin 81 MG tablet Take 81 mg by mouth daily.     Cetirizine HCl 10 MG CAPS Take 1 capsule (10 mg total) by mouth daily. 15 capsule 0   clopidogrel (PLAVIX) 75 MG tablet Take 0.5 tablets (37.5 mg total) by mouth every other day. 15 tablet 6   enalapril (VASOTEC) 5 MG tablet Take 1 tablet (5 mg total) by  mouth daily. 90 tablet 1   valACYclovir (VALTREX) 1000 MG tablet Take 2 tablets po q 12 hours for 2 doses. 30 tablet 2   atenolol (TENORMIN) 50 MG tablet Take 1 tablet (50 mg total) by mouth daily. 90 tablet 0   [DISCONTINUED] clopidogrel (PLAVIX) 75 MG tablet Take 0.5 tablets (37.5 mg total) by mouth every other day. 15 tablet 2   No current facility-administered medications on file prior to visit.    Social History   Socioeconomic History   Marital status: Single    Spouse name: Not on file   Number of children: 1   Years of education: BS   Highest education level: Master's degree (e.g., MA, MS, MEng, MEd, MSW, MBA)  Occupational History   Occupation: PHARMACIST    Employer: [employer]  Tobacco  Use   Smoking status: Never    Passive exposure: Never   Smokeless tobacco: Never  Vaping Use   Vaping status: Never Used  Substance and Sexual Activity   Alcohol use: No    Alcohol/week: 0.0 standard drinks of alcohol   Drug use: No   Sexual activity: Not Currently    Partners: Male  Other Topics Concern   Not on file  Social History Narrative   Not on file   Social Drivers of Health   Financial Resource Strain: Low Risk  (05/31/2023)   Overall Financial Resource Strain (CARDIA)    Difficulty of Paying Living Expenses: Not very hard  Food Insecurity: No Food Insecurity (05/31/2023)   Hunger Vital Sign    Worried About Running Out of Food in the Last Year: Never true    Ran Out of Food in the Last Year: Never true  Transportation Needs: No Transportation Needs (05/31/2023)   PRAPARE - Administrator, Civil Service (Medical): No    Lack of Transportation (Non-Medical): No  Physical Activity: Insufficiently Active (05/31/2023)   Exercise Vital Sign    Days of Exercise per Week: 1 day    Minutes of Exercise per Session: 30 min  Stress: No Stress Concern Present (05/31/2023)   [employer] of Occupational Health - Occupational Stress Questionnaire    Feeling of Stress : Not at all  Social Connections: Moderately Integrated (05/31/2023)   Social Connection and Isolation Panel [NHANES]    Frequency of Communication with Friends and Family: More than three times a week    Frequency of Social Gatherings with Friends and Family: More than three times a week    Attends Religious Services: More than 4 times per year    Active Member of Clubs or Organizations: Yes    Attends Engineer, structural: More than 4 times per year    Marital Status: Divorced  Catering manager Violence: Not on file    Family History  Problem Relation Age of Onset   Hypertension Mother    Liver disease Mother    Colon cancer Father 37   Throat cancer Father 43   Heart disease  Brother    Aneurysm Brother    Heart attack Brother    Asthma Son    Aneurysm Sister    Aneurysm Brother    Aneurysm Sister    Heart attack Sister    Aneurysm Sister 18   Stroke Maternal Grandmother    Breast cancer Paternal Aunt 71       metastatic   Breast cancer Paternal Aunt 35       metastatic   Colon polyps Brother  Breast cancer Cousin    Breast cancer Paternal Aunt     Allergies  Allergen Reactions   Atorvastatin Hives and Itching      PE Today's Vitals   06/07/23 0803  BP: 104/62  Pulse: 64  Temp: 98.2 F (36.8 C)  TempSrc: Oral  SpO2: 99%  Weight: 115 lb (52.2 kg)  Height: 5' 4.5" (1.638 m)   Body mass index is 19.43 kg/m.  Physical Exam Vitals reviewed. Exam conducted with a chaperone present.  Constitutional:      General: She is not in acute distress.    Appearance: Normal appearance.  HENT:     Head: Normocephalic and atraumatic.     Nose: Nose normal.  Eyes:     Extraocular Movements: Extraocular movements intact.     Conjunctiva/sclera: Conjunctivae normal.  Neck:     Thyroid: No thyroid mass, thyromegaly or thyroid tenderness.  Pulmonary:     Effort: Pulmonary effort is normal.  Chest:     Chest wall: No mass or tenderness.  Breasts:    Right: Normal. No swelling, mass, nipple discharge, skin change or tenderness.     Left: Normal. No swelling, mass, nipple discharge, skin change or tenderness.  Abdominal:     General: There is no distension.     Palpations: Abdomen is soft.     Tenderness: There is no abdominal tenderness.  Genitourinary:    General: Normal vulva.     Exam position: Lithotomy position.     Urethra: No prolapse.     Vagina: Normal. No vaginal discharge or bleeding.     Cervix: Normal. No lesion.     Uterus: Normal. Not enlarged and not tender.      Adnexa: Right adnexa normal and left adnexa normal.  Musculoskeletal:        General: Normal range of motion.     Cervical back: Normal range of motion.   Lymphadenopathy:     Upper Body:     Right upper body: No axillary adenopathy.     Left upper body: No axillary adenopathy.     Lower Body: No right inguinal adenopathy. No left inguinal adenopathy.  Skin:    General: Skin is warm and dry.  Neurological:     General: No focal deficit present.     Mental Status: She is alert.  Psychiatric:        Mood and Affect: Mood normal.        Behavior: Behavior normal.      Assessment and Plan:        Well woman exam with routine gynecological exam Assessment & Plan: Cervical cancer screening performed according to ASCCP guidelines. Encouraged annual mammogram screening Colonoscopy UTD DXA N/A Labs and immunizations with her primary Encouraged safe sexual practices as indicated Encouraged healthy lifestyle practices with diet and exercise For patients under 50-70yo, I recommend 1200mg  calcium daily and 600IU of vitamin D daily.    Increased risk of breast cancer Assessment & Plan: Continue annual MMG and MRI  Orders: -     MR BREAST BILATERAL W WO CONTRAST INC CAD; Future   Rosalyn Gess, MD

## 2023-06-07 NOTE — Assessment & Plan Note (Signed)
 Cervical cancer screening performed according to ASCCP guidelines. Encouraged annual mammogram screening Colonoscopy UTD DXA N/A Labs and immunizations with her primary Encouraged safe sexual practices as indicated Encouraged healthy lifestyle practices with diet and exercise For patients under 50-55yo, I recommend 1200mg  calcium daily and 600IU of vitamin D daily.

## 2023-06-07 NOTE — Assessment & Plan Note (Addendum)
 Continue annual MMG and MRI

## 2023-06-14 ENCOUNTER — Ambulatory Visit: Attending: Cardiovascular Disease | Admitting: Cardiovascular Disease

## 2023-06-14 ENCOUNTER — Encounter: Payer: Self-pay | Admitting: Cardiovascular Disease

## 2023-06-14 VITALS — BP 122/90 | HR 66 | Ht 63.5 in | Wt 114.2 lb

## 2023-06-14 DIAGNOSIS — Z8249 Family history of ischemic heart disease and other diseases of the circulatory system: Secondary | ICD-10-CM | POA: Diagnosis not present

## 2023-06-14 DIAGNOSIS — E782 Mixed hyperlipidemia: Secondary | ICD-10-CM | POA: Diagnosis not present

## 2023-06-14 DIAGNOSIS — R079 Chest pain, unspecified: Secondary | ICD-10-CM

## 2023-06-14 DIAGNOSIS — I10 Essential (primary) hypertension: Secondary | ICD-10-CM | POA: Diagnosis not present

## 2023-06-14 NOTE — Assessment & Plan Note (Signed)
 History of hyperlipidemia not on statin therapy with a coronary calcium score of 0.  She did start a vegan diet several years ago and her most recent lipid profile performed 11/26-24 revealed total cholesterol 187, LDL 100 and HDL 72.

## 2023-06-14 NOTE — Assessment & Plan Note (Signed)
 Family history of heart disease with a brother that died of a myocardial infarction at age 55 and a sister that had MI at age 61.

## 2023-06-14 NOTE — Patient Instructions (Signed)
 Medication Instructions:  Your physician recommends that you continue on your current medications as directed. Please refer to the Current Medication list given to you today.  *If you need a refill on your cardiac medications before your next appointment, please call your pharmacy*    Follow-Up: At [facility], you and your health needs are our priority.  As part of our continuing mission to provide you with exceptional heart care, we have created designated Provider Care Teams.  These Care Teams include your primary Cardiologist (physician) and Advanced Practice Providers (APPs -  Physician Assistants and Nurse Practitioners) who all work together to provide you with the care you need, when you need it.  We recommend signing up for the patient portal called "MyChart".  Sign up information is provided on this After Visit Summary.  MyChart is used to connect with patients for Virtual Visits (Telemedicine).  Patients are able to view lab/test results, encounter notes, upcoming appointments, etc.  Non-urgent messages can be sent to your provider as well.   To learn more about what you can do with MyChart, go to [email or web address].    Your next appointment:   We will see you on an as needed basis.  Provider:   Nanetta Batty, MD    Other Instructions   1st Floor: - Lobby - Registration  - Pharmacy  - Lab - Cafe  2nd Floor: - PV Lab - Diagnostic Testing (echo, CT, nuclear med)  3rd Floor: - Vacant  4th Floor: - TCTS (cardiothoracic surgery) - AFib Clinic - Structural Heart Clinic - Vascular Surgery  - Vascular Ultrasound  5th Floor: - [facility] (general and EP) - Clinical Pharmacy for coumadin, hypertension, lipid, weight-loss medications, and med management appointments    Valet parking services will be available as well.

## 2023-06-14 NOTE — Assessment & Plan Note (Signed)
 History of atypical chest pain in the past with a coronary calcium score of 0 performed 09/26/2020.  She has had no recurrent symptoms.

## 2023-06-14 NOTE — Assessment & Plan Note (Signed)
 History of essential hypertension her blood pressure measured today at 122/90.  Ordinarily her blood pressure is lower than this.  She is on atenolol 25 mg a day and low-dose enalapril.

## 2023-06-14 NOTE — Progress Notes (Signed)
 06/14/2023 Sharon Mendoza   Aug 04, 1970  [id number]  Primary Physician Rema Fendt, NP Primary Cardiologist: Runell Gess MD Roseanne Reno  HPI:  Sharon Mendoza is a 53 y.o.  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 She was self-referred for evaluation treatment of hypertension and atypical chest pain. I last saw in the office 06/14/2017.  She has a history of hypertension, hyperlipidemia and family history with a brother who died at age 31 of a myocardial infarction and a sister who had an MI at age 70. She does have a strong history of cerebral aneurysms. 5 her 14 siblings have had cerebral aneurysms one of whom died from this. She has had cerebral aneurysm stenting by Dr. Titus Dubin as well. When I saw her last 2 years ago she was getting atypical chest pain and had a negative GXT. Her symptoms have spontaneously resolved her blood pressure is under much better control.  She did have a coronary calcium score performed 09/26/2020 which was 0.  Since I saw her 6 years ago she is remained stable.  She has lost 10 pounds.  She is on a vegan diet.  Her LDL is 100.  She denies chest pain or shortness of breath.   Current Meds  Medication Sig   aspirin 81 MG tablet Take 81 mg by mouth daily.   atenolol (TENORMIN) 50 MG tablet Take 1 tablet (50 mg total) by mouth daily. (Patient taking differently: Take 0.5 mg by mouth daily.)   Cetirizine HCl 10 MG CAPS Take 1 capsule (10 mg total) by mouth daily.   clopidogrel (PLAVIX) 75 MG tablet Take 0.5 tablets (37.5 mg total) by mouth every other day.   enalapril (VASOTEC) 5 MG tablet Take 1 tablet (5 mg total) by mouth daily.     Allergies  Allergen Reactions   Atorvastatin Hives and Itching    Social History   Socioeconomic History   Marital status: Single    Spouse name: Not on file   Number of  children: 1   Years of education: BS   Highest education level: Master's degree (e.g., MA, MS, MEng, MEd, MSW, MBA)  Occupational History   Occupation: PHARMACIST    Employer: [employer]  Tobacco Use   Smoking status: Never    Passive exposure: Never   Smokeless tobacco: Never  Vaping Use   Vaping status: Never Used  Substance and Sexual Activity   Alcohol use: No    Alcohol/week: 0.0 standard drinks of alcohol   Drug use: No   Sexual activity: Not Currently    Partners: Male  Other Topics Concern   Not on file  Social History Narrative   Not on file   Social Drivers of Health   Financial Resource Strain: Low Risk  (05/31/2023)   Overall Financial Resource Strain (CARDIA)    Difficulty of Paying Living Expenses: Not very hard  Food Insecurity: No Food Insecurity (05/31/2023)   Hunger Vital Sign    Worried About Running Out of Food in the Last Year: Never true    Ran Out of Food in the Last Year: Never true  Transportation Needs: No Transportation Needs (05/31/2023)   PRAPARE - Administrator, Civil Service (Medical): No    Lack of Transportation (Non-Medical): No  Physical Activity: Insufficiently Active (05/31/2023)   Exercise Vital Sign  Days of Exercise per Week: 1 day    Minutes of Exercise per Session: 30 min  Stress: No Stress Concern Present (05/31/2023)   [employer] of Occupational Health - Occupational Stress Questionnaire    Feeling of Stress : Not at all  Social Connections: Moderately Integrated (05/31/2023)   Social Connection and Isolation Panel [NHANES]    Frequency of Communication with Friends and Family: More than three times a week    Frequency of Social Gatherings with Friends and Family: More than three times a week    Attends Religious Services: More than 4 times per year    Active Member of [employer] or Organizations: Yes    Attends Engineer, structural: More than 4 times per year    Marital Status: Divorced  Careers information officer Violence: Not on file     Review of Systems: General: negative for chills, fever, night sweats or weight changes.  Cardiovascular: negative for chest pain, dyspnea on exertion, edema, orthopnea, palpitations, paroxysmal nocturnal dyspnea or shortness of breath Dermatological: negative for rash Respiratory: negative for cough or wheezing Urologic: negative for hematuria Abdominal: negative for nausea, vomiting, diarrhea, bright red blood per rectum, melena, or hematemesis Neurologic: negative for visual changes, syncope, or dizziness All other systems reviewed and are otherwise negative except as noted above.    Blood pressure (!) 122/90, pulse 66, height 5' 3.5" (1.613 m), weight 114 lb 3.2 oz (51.8 kg), SpO2 97%.  General appearance: alert and no distress Neck: no adenopathy, no carotid bruit, no JVD, supple, symmetrical, trachea midline, and thyroid not enlarged, symmetric, no tenderness/mass/nodules Lungs: clear to auscultation bilaterally Heart: regular rate and rhythm, S1, S2 normal, no murmur, click, rub or gallop Extremities: extremities normal, atraumatic, no cyanosis or edema Pulses: 2+ and symmetric Skin: Skin color, texture, turgor normal. No rashes or lesions Neurologic: Grossly normal  EKG EKG Interpretation Date/Time:  Tuesday June 14 2023 09:11:35 EDT Ventricular Rate:  66 PR Interval:  140 QRS Duration:  82 QT Interval:  378 QTC Calculation: 396 R Axis:   55  Text Interpretation: Normal sinus rhythm Normal ECG When compared with ECG of 21-Feb-2018 13:19, PREVIOUS ECG IS PRESENT Confirmed by Nanetta Batty [phone]) on 06/14/2023 10:00:41 AM    ASSESSMENT AND PLAN:   Essential hypertension History of essential hypertension her blood pressure measured today at 122/90.  Ordinarily her blood pressure is lower than this.  She is on atenolol 25 mg a day and low-dose enalapril.  Hyperlipidemia History of hyperlipidemia not on statin therapy with a coronary  calcium score of 0.  She did start a vegan diet several years ago and her most recent lipid profile performed 11/26-24 revealed total cholesterol 187, LDL 100 and HDL 72.  Chest pain History of atypical chest pain in the past with a coronary calcium score of 0 performed 09/26/2020.  She has had no recurrent symptoms.  Family history of coronary artery disease Family history of heart disease with a brother that died of a myocardial infarction at age 53 and a sister that had MI at age 55.     Runell Gess MD FACP,FACC,FAHA, [facility] 06/14/2023 10:09 AM

## 2023-06-16 ENCOUNTER — Encounter: Payer: Self-pay | Admitting: Obstetrics and Gynecology

## 2023-06-16 ENCOUNTER — Ambulatory Visit
Admission: RE | Admit: 2023-06-16 | Discharge: 2023-06-16 | Disposition: A | Discharge status: Home / Self Care | Source: Ambulatory Visit | Attending: Obstetrics and Gynecology

## 2023-06-16 DIAGNOSIS — Z9189 Other specified personal risk factors, not elsewhere classified: Secondary | ICD-10-CM

## 2023-06-16 MED ORDER — GADOPICLENOL 0.5 MMOL/ML IV SOLN
5.0000 mL | Freq: Once | INTRAVENOUS | Status: AC | PRN
  Administered 2023-06-16: 5 mL via INTRAVENOUS

## 2023-07-01 NOTE — Telephone Encounter (Signed)
 Called patient and went on mychart notes.  I scheduled her for a PE 07/14/23 to see Ricky Stabs, NP .

## 2023-07-14 ENCOUNTER — Encounter: Payer: Self-pay | Admitting: Family

## 2023-07-14 ENCOUNTER — Ambulatory Visit (INDEPENDENT_AMBULATORY_CARE_PROVIDER_SITE_OTHER): Admitting: Family

## 2023-07-14 VITALS — BP 118/74 | HR 59 | Temp 98.6°F | Resp 16 | Ht 63.0 in | Wt 115.0 lb

## 2023-07-14 DIAGNOSIS — Z130 Encounter for screening for diseases of the blood and blood-forming organs and certain disorders involving the immune mechanism: Secondary | ICD-10-CM | POA: Diagnosis not present

## 2023-07-14 DIAGNOSIS — Z13228 Encounter for screening for other metabolic disorders: Secondary | ICD-10-CM

## 2023-07-14 DIAGNOSIS — Z0000 Encounter for general adult medical examination without abnormal findings: Secondary | ICD-10-CM

## 2023-07-14 DIAGNOSIS — Z1329 Encounter for screening for other suspected endocrine disorder: Secondary | ICD-10-CM

## 2023-07-14 DIAGNOSIS — Z13220 Encounter for screening for lipoid disorders: Secondary | ICD-10-CM | POA: Diagnosis not present

## 2023-07-14 DIAGNOSIS — I10 Essential (primary) hypertension: Secondary | ICD-10-CM

## 2023-07-14 MED ORDER — ENALAPRIL MALEATE 5 MG PO TABS: 5.0000 mg | ORAL_TABLET | Freq: Every day | ORAL | 0 refills | Status: DC

## 2023-07-14 MED ORDER — ATENOLOL 50 MG PO TABS: 50.0000 mg | ORAL_TABLET | Freq: Every day | ORAL | 0 refills | Status: DC

## 2023-07-14 NOTE — Progress Notes (Signed)
 No concerns.

## 2023-07-14 NOTE — Progress Notes (Signed)
 Patient ID: Sharon Mendoza, female    DOB: Jan 08, 1971  MRN: [id number]  CC: Annual Exam  Subjective: Sharon Mendoza is a 53 y.o. female who presents for annual exam.   Her concerns today include:  - Doing well on Atenolol  and Enalapril , no issues/concerns. Last office visit with Cardiology on 06/14/2023. She does not complain of red flag symptoms such as but not limited to chest pain, shortness of breath, worst headache of life, nausea/vomiting.  - Patient reports she is up to date on mammogram, cervical cancer screening, and colon cancer screening.  Patient Active Problem List   Diagnosis Date Noted   Well woman exam with routine gynecological exam 06/07/2023   Increased risk of breast cancer 06/07/2023   Prediabetes 05/21/2022   Ganglion cyst of flexor tendon sheath of finger, left 08/27/2021   Varicose veins of bilateral lower extremities with pain 04/30/2021   Family history of coronary artery disease 05/08/2020   Dyslipidemia 04/24/2020   Gastroesophageal reflux disease without esophagitis 04/24/2020   Pain in right knee 04/24/2020   History of cerebral aneurysm 04/17/2018   Genetic testing 06/08/2017   Family history of colon cancer    Family history of breast cancer    Essential hypertension 02/18/2015   Hyperlipidemia 02/18/2015   Chest pain 02/18/2015   Brain aneurysm 11/21/2014   Pain in joint, shoulder region 12/10/2013   Low back pain radiating to left lower extremity 12/10/2013   Aneurysm, cerebral, nonruptured 12/10/2013   Fibroid uterus 06/22/2012   Menorrhagia 06/22/2012   Dysmenorrhea 06/22/2012     Current Outpatient Medications on File Prior to Visit  Medication Sig Dispense Refill   aspirin  81 MG tablet Take 81 mg by mouth daily.     Cetirizine  HCl 10 MG CAPS Take 1 capsule (10 mg total) by mouth daily. 15 capsule 0   clopidogrel  (PLAVIX ) 75 MG tablet Take 0.5 tablets (37.5 mg total) by mouth every other day. 15 tablet 6   valACYclovir  (VALTREX )  1000 MG tablet Take 2 tablets po q 12 hours for 2 doses. (Patient not taking: Reported on 06/14/2023) 30 tablet 2   [DISCONTINUED] clopidogrel  (PLAVIX ) 75 MG tablet Take 0.5 tablets (37.5 mg total) by mouth every other day. 15 tablet 2   No current facility-administered medications on file prior to visit.    Allergies  Allergen Reactions   Atorvastatin  Hives and Itching    Social History   Socioeconomic History   Marital status: Single    Spouse name: Not on file   Number of children: 1   Years of education: BS   Highest education level: Master's degree (e.g., MA, MS, MEng, MEd, MSW, MBA)  Occupational History   Occupation: PHARMACIST    Employer: [employer]  Tobacco Use   Smoking status: Never    Passive exposure: Never   Smokeless tobacco: Never  Vaping Use   Vaping status: Never Used  Substance and Sexual Activity   Alcohol use: No    Alcohol/week: 0.0 standard drinks of alcohol   Drug use: No   Sexual activity: Not Currently    Partners: Male  Other Topics Concern   Not on file  Social History Narrative   Not on file   Social Drivers of Health   Financial Resource Strain: Low Risk  (05/31/2023)   Overall Financial Resource Strain (CARDIA)    Difficulty of Paying Living Expenses: Not very hard  Food Insecurity: No Food Insecurity (05/31/2023)   Hunger Vital Sign  Worried About Programme researcher, broadcasting/film/video in the Last Year: Never true    Ran Out of Food in the Last Year: Never true  Transportation Needs: No Transportation Needs (05/31/2023)   PRAPARE - Administrator, Civil Service (Medical): No    Lack of Transportation (Non-Medical): No  Physical Activity: Insufficiently Active (05/31/2023)   Exercise Vital Sign    Days of Exercise per Week: 1 day    Minutes of Exercise per Session: 30 min  Stress: No Stress Concern Present (05/31/2023)   [employer] of Occupational Health - Occupational Stress Questionnaire    Feeling of Stress : Not at all   Social Connections: Moderately Integrated (05/31/2023)   Social Connection and Isolation Panel [NHANES]    Frequency of Communication with Friends and Family: More than three times a week    Frequency of Social Gatherings with Friends and Family: More than three times a week    Attends Religious Services: More than 4 times per year    Active Member of Clubs or Organizations: Yes    Attends Banker Meetings: More than 4 times per year    Marital Status: Divorced  Intimate Partner Violence: Not At Risk (07/14/2023)   Humiliation, Afraid, Rape, and Kick questionnaire    Fear of Current or Ex-Partner: No    Emotionally Abused: No    Physically Abused: No    Sexually Abused: No    Family History  Problem Relation Age of Onset   Hypertension Mother    Liver disease Mother    Colon cancer Father 37   Throat cancer Father 27   Heart disease Brother    Aneurysm Brother    Heart attack Brother    Asthma Son    Aneurysm Sister    Aneurysm Brother    Aneurysm Sister    Heart attack Sister    Aneurysm Sister 18   Stroke Maternal Grandmother    Breast cancer Paternal Aunt 19       metastatic   Breast cancer Paternal Aunt 73       metastatic   Colon polyps Brother    Breast cancer Cousin    Breast cancer Paternal Aunt     Past Surgical History:  Procedure Laterality Date   BREAST BIOPSY     age 8 left breast   CARDIOVASCULAR STRESS TEST  05/19/11   05/19/11 Nuclear stress test ([facility]): Normal study, EF 87%   CESAREAN SECTION  09/2005   CT CORONARY CA SCORING  06/23/12   06/23/12 Cardiac CTA ([facility]): Ca score 0. Right dominant. Normal coronaries.   INTRACRANIAL ANEURYSM REPAIR     MYOMECTOMY  2005   RADIOLOGY WITH ANESTHESIA N/A 11/21/2014   Procedure: Embolization;  Surgeon: Luellen Sages, MD;  Location: [facility] OR;  Service: Radiology;  Laterality: N/A;   US  ECHOCARDIOGRAPHY  05/18/11   05/18/11 Echo ([facility]): NL chamber size, LV systolic  function, wall motion. LVEF 77%. Mild LVH. Mild MR/TR. NL PAP.    ROS: Review of Systems Negative except as stated above  PHYSICAL EXAM: BP 118/74   Pulse (!) 59   Temp 98.6 F (37 C) (Oral)   Resp 16   Ht 5\' 3"  (1.6 m)   Wt 115 lb (52.2 kg)   LMP  (LMP Unknown)   SpO2 98%   BMI 20.37 kg/m   Physical Exam HENT:     Head: Normocephalic and atraumatic.     Right Ear: Tympanic membrane,  ear canal and external ear normal.     Left Ear: Tympanic membrane, ear canal and external ear normal.     Nose: Nose normal.     Mouth/Throat:     Mouth: Mucous membranes are moist.     Pharynx: Oropharynx is clear.  Eyes:     Extraocular Movements: Extraocular movements intact.     Conjunctiva/sclera: Conjunctivae normal.     Pupils: Pupils are equal, round, and reactive to light.  Neck:     Thyroid: No thyroid mass, thyromegaly or thyroid tenderness.  Cardiovascular:     Rate and Rhythm: Bradycardia present.     Pulses: Normal pulses.     Heart sounds: Normal heart sounds.  Pulmonary:     Effort: Pulmonary effort is normal.     Breath sounds: Normal breath sounds.  Chest:     Comments: Patient declined. Abdominal:     General: Bowel sounds are normal.     Palpations: Abdomen is soft.  Genitourinary:    Comments: Patient declined. Musculoskeletal:        General: Normal range of motion.     Right shoulder: Normal.     Left shoulder: Normal.     Right upper arm: Normal.     Left upper arm: Normal.     Right elbow: Normal.     Left elbow: Normal.     Right forearm: Normal.     Left forearm: Normal.     Right wrist: Normal.     Left wrist: Normal.     Right hand: Normal.     Left hand: Normal.     Cervical back: Normal, normal range of motion and neck supple.     Thoracic back: Normal.     Lumbar back: Normal.     Right hip: Normal.     Left hip: Normal.     Right upper leg: Normal.     Left upper leg: Normal.     Right knee: Normal.     Left knee: Normal.     Right  lower leg: Normal.     Left lower leg: Normal.     Right ankle: Normal.     Left ankle: Normal.     Right foot: Normal.     Left foot: Normal.  Skin:    General: Skin is warm and dry.     Capillary Refill: Capillary refill takes less than 2 seconds.  Neurological:     General: No focal deficit present.     Mental Status: She is alert and oriented to person, place, and time.  Psychiatric:        Mood and Affect: Mood normal.        Behavior: Behavior normal.     ASSESSMENT AND PLAN: 1. Annual physical exam (Primary) - Counseled on 150 minutes of exercise per week as tolerated, healthy eating (including decreased daily intake of saturated fats, cholesterol, added sugars, sodium), STI prevention, and routine healthcare maintenance.  2. Screening for metabolic disorder - Routine screening.  - CMP14+EGFR  3. Screening for deficiency anemia - Routine screening.  - CBC  4. Screening cholesterol level - Routine screening.  - Lipid panel  5. Thyroid disorder screen - Routine screening.  - TSH  6. Primary hypertension - Continue Atenolol  and Enalapril  as prescribed.  - Counseled on blood pressure goal of less than 130/80, low-sodium, DASH diet, medication compliance, and 150 minutes of moderate intensity exercise per week as tolerated. Counseled on medication adherence and adverse  effects. - Follow-up with primary provider in 3 months or sooner if needed.  - atenolol  (TENORMIN ) 50 MG tablet; Take 1 tablet (50 mg total) by mouth daily.  Dispense: 90 tablet; Refill: 0 - enalapril  (VASOTEC ) 5 MG tablet; Take 1 tablet (5 mg total) by mouth daily.  Dispense: 90 tablet; Refill: 0    Patient was given the opportunity to ask questions.  Patient verbalized understanding of the plan and was able to repeat key elements of the plan. Patient was given clear instructions to go to Emergency Department or return to medical center if symptoms don't improve, worsen, or new problems develop.The  patient verbalized understanding.   Orders Placed This Encounter  Procedures   CBC   Lipid panel   CMP14+EGFR   TSH     Requested Prescriptions   Signed Prescriptions Disp Refills   atenolol  (TENORMIN ) 50 MG tablet 90 tablet 0    Sig: Take 1 tablet (50 mg total) by mouth daily.   enalapril  (VASOTEC ) 5 MG tablet 90 tablet 0    Sig: Take 1 tablet (5 mg total) by mouth daily.    Return in about 1 year (around 07/13/2024) for Physical per patient preference.  Senaida Dama, NP

## 2023-07-15 ENCOUNTER — Encounter: Payer: Self-pay | Admitting: Family

## 2023-07-15 LAB — CMP14+EGFR
ALT: 11 IU/L (ref 0–32)
AST: 21 IU/L (ref 0–40)
Albumin: 4.5 g/dL (ref 3.8–4.9)
Alkaline Phosphatase: 92 IU/L (ref 44–121)
BUN/Creatinine Ratio: 14 (ref 9–23)
BUN: 11 mg/dL (ref 6–24)
Bilirubin Total: 0.2 mg/dL (ref 0.0–1.2)
CO2: 26 mmol/L (ref 20–29)
Calcium: 10 mg/dL (ref 8.7–10.2)
Chloride: 103 mmol/L (ref 96–106)
Creatinine, Ser: 0.77 mg/dL (ref 0.57–1.00)
Globulin, Total: 2.3 g/dL (ref 1.5–4.5)
Glucose: 65 mg/dL — ABNORMAL LOW (ref 70–99)
Potassium: 4.7 mmol/L (ref 3.5–5.2)
Sodium: 142 mmol/L (ref 134–144)
Total Protein: 6.8 g/dL (ref 6.0–8.5)
eGFR: 92 mL/min/{1.73_m2} (ref >59)

## 2023-07-15 LAB — CBC
Hematocrit: 39.9 % (ref 34.0–46.6)
Hemoglobin: 13 g/dL (ref 11.1–15.9)
MCH: 28.8 pg (ref 26.6–33.0)
MCHC: 32.6 g/dL (ref 31.5–35.7)
MCV: 89 fL (ref 79–97)
Platelets: 233 10*3/uL (ref 150–450)
RBC: 4.51 x10E6/uL (ref 3.77–5.28)
RDW: 13.8 % (ref 11.7–15.4)
WBC: 3.9 10*3/uL (ref 3.4–10.8)

## 2023-07-15 LAB — LIPID PANEL
Chol/HDL Ratio: 2.5 ratio (ref 0.0–4.4)
Cholesterol, Total: 187 mg/dL (ref 100–199)
HDL: 75 mg/dL (ref >39)
LDL Chol Calc (NIH): 95 mg/dL (ref 0–99)
Triglycerides: 95 mg/dL (ref 0–149)
VLDL Cholesterol Cal: 17 mg/dL (ref 5–40)

## 2023-07-15 LAB — TSH: TSH: 0.761 u[IU]/mL (ref 0.450–4.500)

## 2023-09-16 ENCOUNTER — Encounter (HOSPITAL_COMMUNITY): Payer: Self-pay | Admitting: Interventional Radiology

## 2023-11-01 ENCOUNTER — Encounter: Payer: Self-pay | Admitting: Family

## 2023-11-01 ENCOUNTER — Other Ambulatory Visit: Payer: Self-pay | Admitting: Family

## 2023-11-01 DIAGNOSIS — I10 Essential (primary) hypertension: Secondary | ICD-10-CM

## 2023-11-01 NOTE — Telephone Encounter (Signed)
 Patient was seen on 4/4 ok to refill?

## 2023-11-01 NOTE — Telephone Encounter (Signed)
 Copied from CRM [id number]. Topic: Clinical - Medication Refill >> Nov 01, 2023  4:19 PM Yolanda T wrote: Medication: enalapril  (VASOTEC ) 5 MG tablet  Has the patient contacted their pharmacy? Yes [facility] stated they sent a refill request   This is the patient's preferred pharmacy:  [facility] [id number] [place], [place] - [address] [place] [address] Phone: [phone] Fax: [phone]  Is this the correct pharmacy for this prescription? Yes  Has the prescription been filled recently? Yes  Is the patient out of the medication? Yes  Has the patient been seen for an appointment in the last year OR does the patient have an upcoming appointment? Yes  Can we respond through MyChart? Yes  Agent: Please be advised that Rx refills may take up to 3 business days. We ask that you follow-up with your pharmacy.  Patient took last dosage on 8/11

## 2023-11-02 ENCOUNTER — Other Ambulatory Visit: Payer: Self-pay | Admitting: Family

## 2023-11-02 DIAGNOSIS — I10 Essential (primary) hypertension: Secondary | ICD-10-CM

## 2023-11-02 MED ORDER — ENALAPRIL MALEATE 5 MG PO TABS: 5.0000 mg | ORAL_TABLET | Freq: Every day | ORAL | 0 refills | Status: DC

## 2023-11-02 NOTE — Telephone Encounter (Signed)
 Complete

## 2023-11-04 NOTE — Telephone Encounter (Signed)
 Duplicate request,LRF 11/02/23.  Requested Prescriptions  Pending Prescriptions Disp Refills   enalapril  (VASOTEC ) 5 MG tablet 90 tablet 0    Sig: Take 1 tablet (5 mg total) by mouth daily.     Cardiovascular:  ACE Inhibitors Passed - 11/04/2023 12:04 PM      Passed - Cr in normal range and within 180 days    Creat  Date Value Ref Range Status  04/30/2021 0.87 0.50 - 1.03 mg/dL Final   Creatinine, Ser  Date Value Ref Range Status  07/14/2023 0.77 0.57 - 1.00 mg/dL Final         Passed - K in normal range and within 180 days    Potassium  Date Value Ref Range Status  07/14/2023 4.7 3.5 - 5.2 mmol/L Final         Passed - Patient is not pregnant      Passed - Last BP in normal range    BP Readings from Last 1 Encounters:  07/14/23 118/74         Passed - Valid encounter within last 6 months    Recent Outpatient Visits           3 months ago Annual physical exam   [facility], Amy J, NP   5 months ago Elevated blood pressure reading   [facility] Theotis Haze ORN, NP   8 months ago Primary hypertension   [facility], [place], NP   1 year ago Primary hypertension   [facility], [place], NP   1 year ago Nodule of external ear, right   [facility], Greig PARAS, NP

## 2023-11-17 IMAGING — MR MR MRA HEAD W/O CM
1 series · 19 of 48 positions shown · non-contrast
Comparison: 12/20/2019

CLINICAL DATA: Brain aneurysm follow-up

EXAM:
MRA HEAD WITHOUT CONTRAST
TECHNIQUE: Angiographic images of the Circle of Willis were acquired using MRA
technique without intravenous contrast.

[Series 5: 3d cow · axial · 0.5mm · 0.41mm/px · z∈[-103,-27]mm · 19 of 172 slices shown]
[im 1/172]
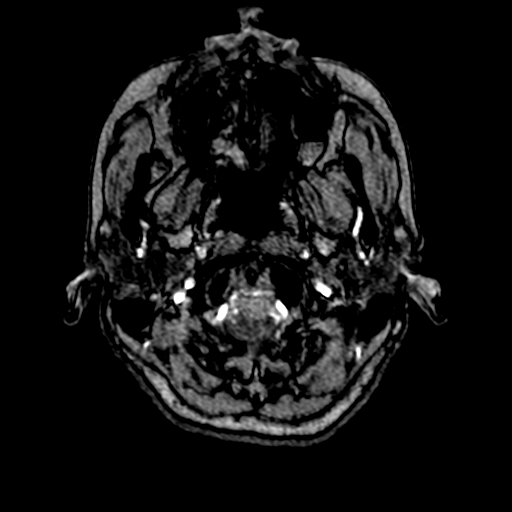
[im 4/172]
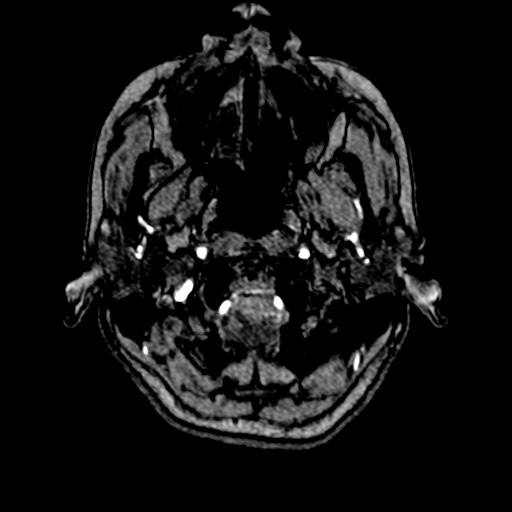
[im 8/172]
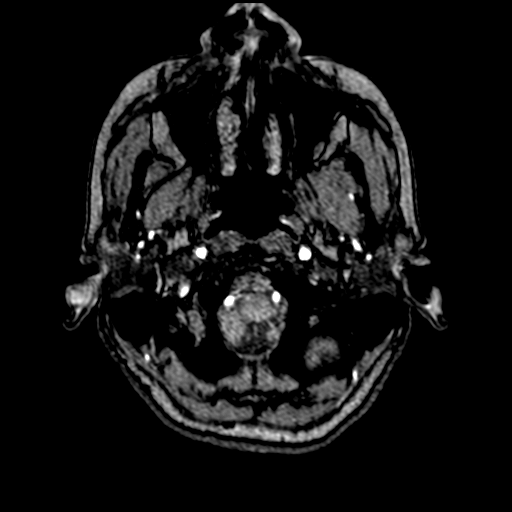
[im 11/172]
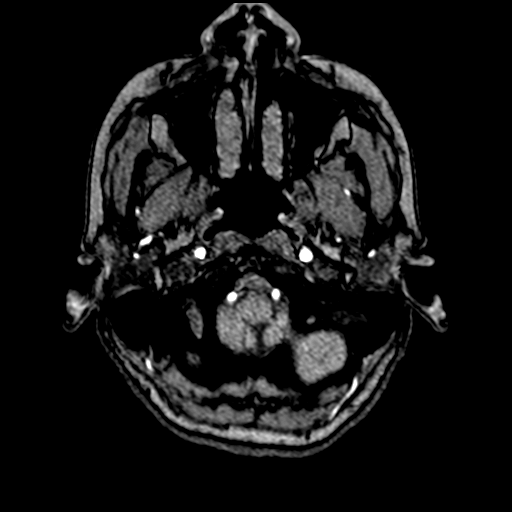
[im 15/172]
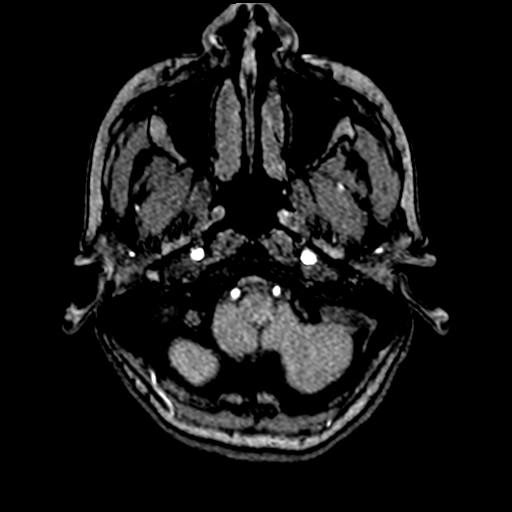
[im 19/172]
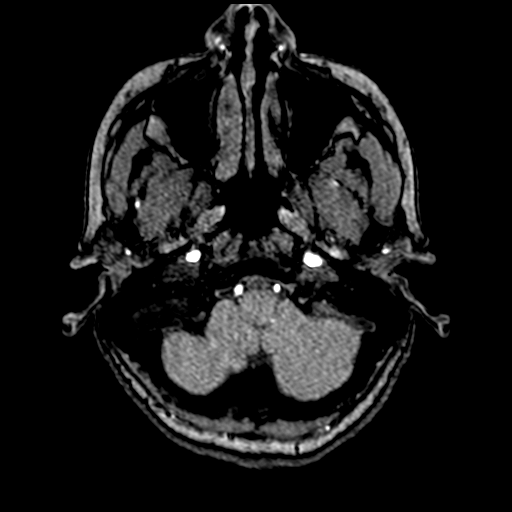
[im 22/172]
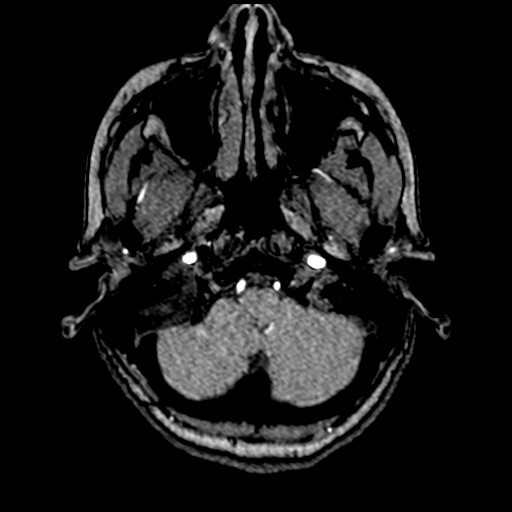
[im 26/172]
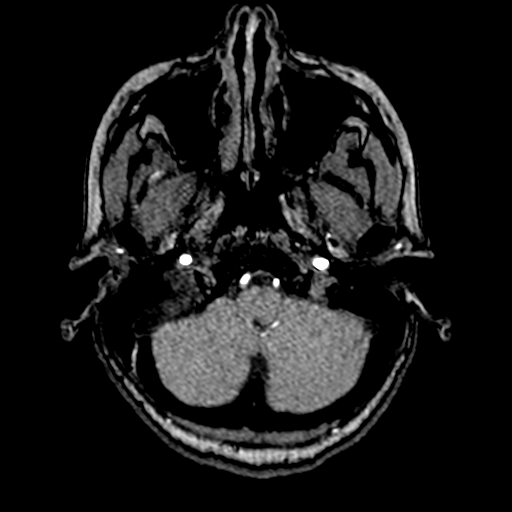
[im 30/172]
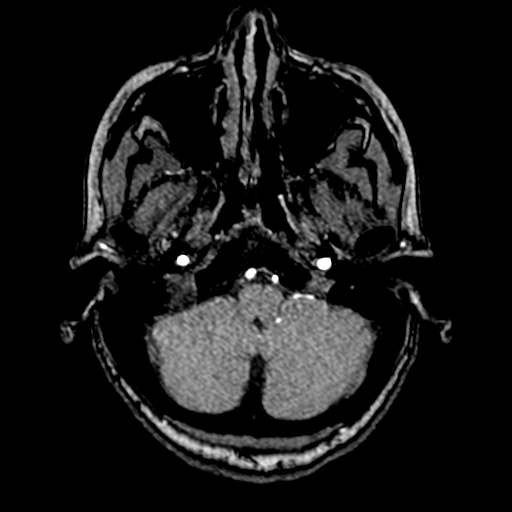
[im 33/172]
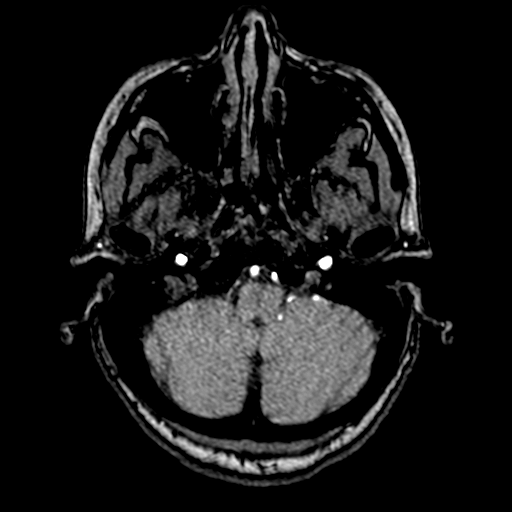
[im 37/172]
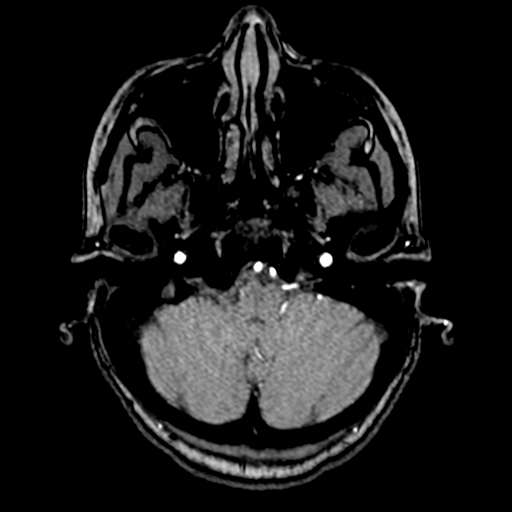
[im 55/172]
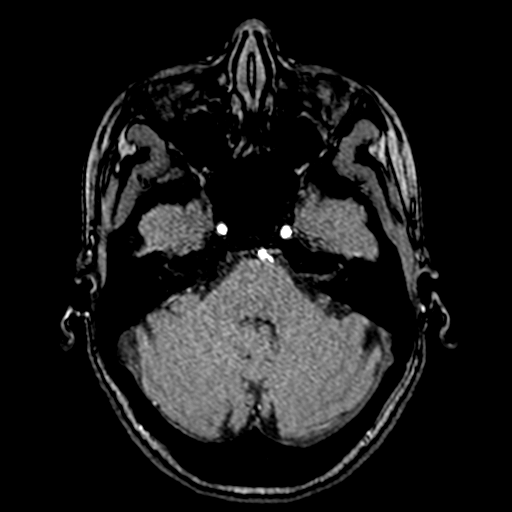
[im 77/172]
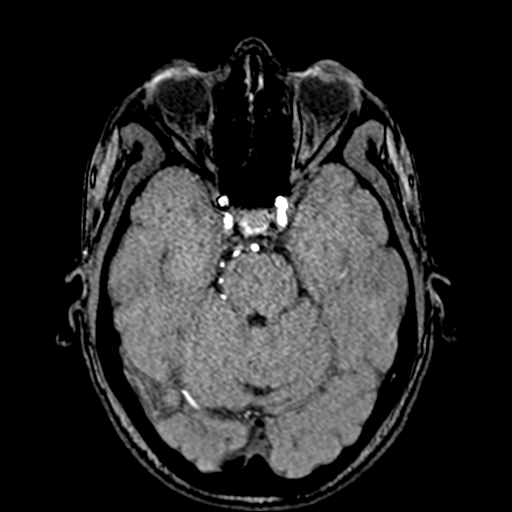
[im 88/172]
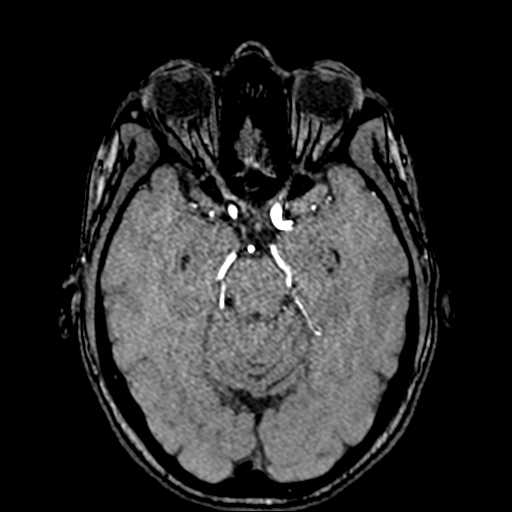
[im 99/172]
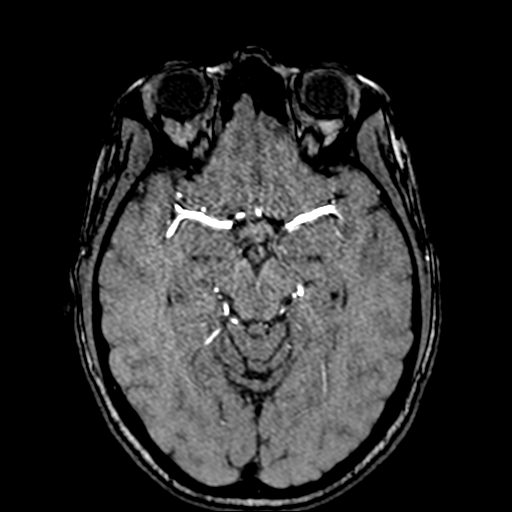
[im 121/172]
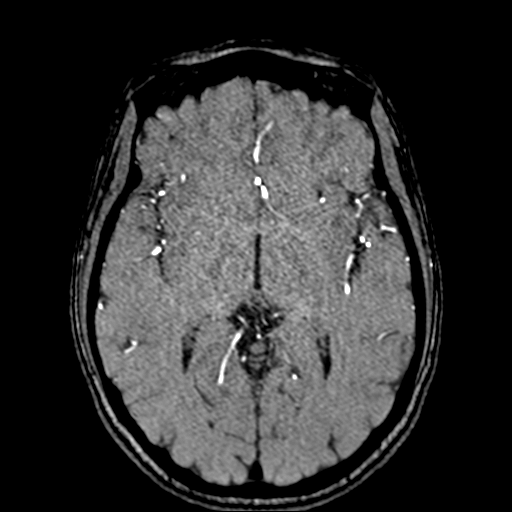
[im 142/172]
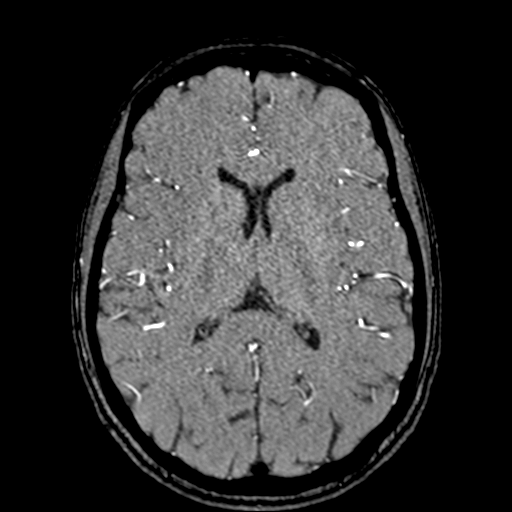
[im 146/172]
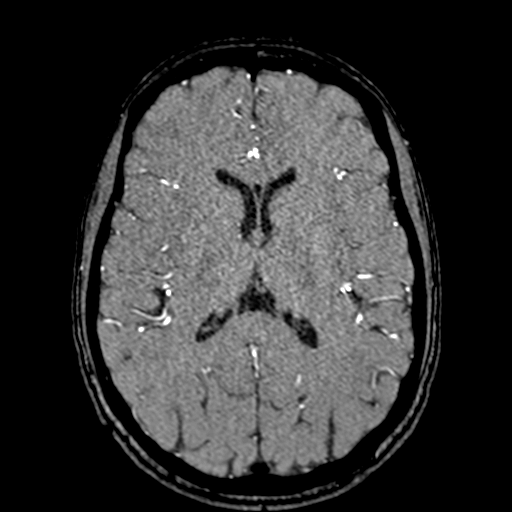
[im 164/172]
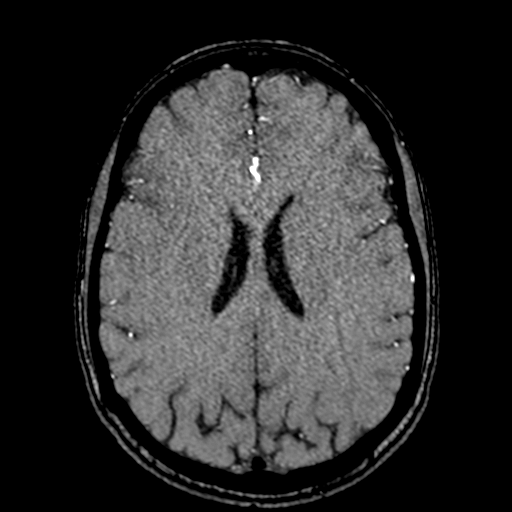

[19 of 48 positions shown; findings below may reference images not displayed]

FINDINGS: Anterior circulation: Unchanged 2 mm focal outpouching along
posterior genu of the right ICA (series 3, image 75). Patent right
ICA stent. No evidence of residual recurrent aneurysm. Both internal
carotid arteries are otherwise patent to the termini, without
stenosis or other abnormality. A1 segments patent, with
redemonstrated right A1 hypoplasia. Normal anterior communicating
artery. Anterior cerebral arteries are patent to their distal
aspects. No M1 stenosis or occlusion. Normal MCA bifurcations.
Distal MCA branches perfused and symmetric.

Posterior circulation: Vertebral arteries patent to the
vertebrobasilar junction without stenosis. Posterior inferior
cerebral arteries patent bilaterally. Basilar patent to its distal
aspect. Superior cerebellar arteries patent bilaterally. PCAs
perfused to their distal aspects without stenosis. The left
posterior communicating artery is visualized. Hypoplastic right
posterior communicating artery versus partial occlusion secondary to
stent.

Anatomic variants: None significant.

Other: None.
IMPRESSION: 1. Patent right ICA pipeline stent, without evidence of residual or
recurrent aneurysm.
2. Unchanged 2 mm right ICA posterior genu outpouching, possibly an
infundibulum or tiny aneurysm.

## 2023-11-22 ENCOUNTER — Ambulatory Visit: Admitting: Family

## 2023-11-22 ENCOUNTER — Encounter: Payer: Self-pay | Admitting: Family

## 2023-11-22 ENCOUNTER — Ambulatory Visit: Payer: Self-pay

## 2023-11-22 VITALS — BP 108/69 | HR 72 | Temp 98.2°F | Resp 16 | Ht 63.0 in | Wt 113.6 lb

## 2023-11-22 DIAGNOSIS — H5789 Other specified disorders of eye and adnexa: Secondary | ICD-10-CM | POA: Diagnosis not present

## 2023-11-22 MED ORDER — ERYTHROMYCIN 5 MG/GM OP OINT: 1.0000 | TOPICAL_OINTMENT | Freq: Every evening | OPHTHALMIC | 0 refills | Status: AC | PRN

## 2023-11-22 NOTE — Progress Notes (Signed)
 Blister on left side of her eye.  Blister came up this morning

## 2023-11-22 NOTE — Telephone Encounter (Signed)
 FYI Only or Action Required?: FYI only for provider.  Patient was last seen in primary care on 07/14/2023 by Lorren Greig PARAS, NP.  Called Nurse Triage reporting Eye Pain.  Symptoms began today.  Interventions attempted: Nothing.  Symptoms are: gradually worsening.  Triage Disposition: See PCP When Office is Open (Within 3 Days)  Patient/caregiver understands and will follow disposition?: Yes     Copied from CRM #[id number]. Topic: Clinical - Red Word Triage >> Nov 22, 2023 11:31 AM Charlet HERO wrote: Red Word that prompted transfer to Nurse Triage: Patient is calling about a blister that apperared in the corner of her eye that just popped up 30 mins ago it is burning and itching. She wants to make sure it is not shingles Reason for Disposition  [1] MODERATE pain (e.g., interferes with normal activities) AND [2] present > 3 days  Answer Assessment - Initial Assessment Questions 1. ONSET: When did the pain start? (e.g., minutes, hours, days)     today 2. TIMING: Does the pain come and go, or has it been constant since it started? (e.g., constant, intermittent, fleeting)     constant 3. SEVERITY: How bad is the pain?  (Scale 1-10; mild, moderate or severe)     Moderate burning, tingling sensation 4. LOCATION: Where does it hurt?  (e.g., eyelid, eye, cheekbone)     Left eye - outside 5. CAUSE: What do you think is causing the pain?     Possible Shingles 6. VISION: Do you have blurred vision or changes in your vision?      no 7. EYE DISCHARGE: Is there any discharge (pus) from the eye(s)?  If Yes, ask: What color is it?      no 8. FEVER: Do you have a fever? If Yes, ask: What is it, how was it measured, and when did it start?      no 9. OTHER SYMPTOMS: Do you have any other symptoms? (e.g., headache, nasal discharge, facial rash)     no 10. PREGNANCY: Is there any chance you are pregnant? When was your last menstrual period?       Na  Pt would like to be  checked for possible shingles.  Protocols used: Finger Pain-A-AH, Eye Pain and Other Symptoms-A-AH

## 2023-11-22 NOTE — Telephone Encounter (Signed)
**Note De-identified  [place] Obfuscation** Please advise 

## 2023-11-22 NOTE — Progress Notes (Signed)
 Patient ID: Sharon Mendoza, female    DOB: Dec 27, 1970  MRN: [id number]  CC: Eye Irritation  Subjective: Sharon Mendoza is a 53 y.o. female who presents for eye irritation.  Her concerns today include:  States left eye irritation began today. Reports burning began after what felt like a small cut. Denies recent trauma/injury and red flag symptoms. States she wants to make sure she doesn't have shingles. Reports she is up to date on shingles vaccine.   Patient Active Problem List   Diagnosis Date Noted   Well woman exam with routine gynecological exam 06/07/2023   Increased risk of breast cancer 06/07/2023   Prediabetes 05/21/2022   Ganglion cyst of flexor tendon sheath of finger, left 08/27/2021   Varicose veins of bilateral lower extremities with pain 04/30/2021   Family history of coronary artery disease 05/08/2020   Dyslipidemia 04/24/2020   Gastroesophageal reflux disease without esophagitis 04/24/2020   Pain in right knee 04/24/2020   History of cerebral aneurysm 04/17/2018   Genetic testing 06/08/2017   Family history of colon cancer    Family history of breast cancer    Essential hypertension 02/18/2015   Hyperlipidemia 02/18/2015   Chest pain 02/18/2015   Brain aneurysm 11/21/2014   Pain in joint, shoulder region 12/10/2013   Low back pain radiating to left lower extremity 12/10/2013   Aneurysm, cerebral, nonruptured 12/10/2013   Fibroid uterus 06/22/2012   Menorrhagia 06/22/2012   Dysmenorrhea 06/22/2012     Current Outpatient Medications on File Prior to Visit  Medication Sig Dispense Refill   aspirin  81 MG tablet Take 81 mg by mouth daily.     atenolol  (TENORMIN ) 50 MG tablet Take 1 tablet (50 mg total) by mouth daily. 90 tablet 0   Cetirizine  HCl 10 MG CAPS Take 1 capsule (10 mg total) by mouth daily. 15 capsule 0   clopidogrel  (PLAVIX ) 75 MG tablet Take 0.5 tablets (37.5 mg total) by mouth every other day. 15 tablet 6   enalapril  (VASOTEC ) 5 MG tablet  Take 1 tablet (5 mg total) by mouth daily. 90 tablet 0   valACYclovir  (VALTREX ) 1000 MG tablet Take 2 tablets po q 12 hours for 2 doses. (Patient not taking: Reported on 06/14/2023) 30 tablet 2   [DISCONTINUED] clopidogrel  (PLAVIX ) 75 MG tablet Take 0.5 tablets (37.5 mg total) by mouth every other day. 15 tablet 2   No current facility-administered medications on file prior to visit.    Allergies  Allergen Reactions   Atorvastatin  Hives and Itching    Social History   Socioeconomic History   Marital status: Single    Spouse name: Not on file   Number of children: 1   Years of education: BS   Highest education level: Master's degree (e.g., MA, MS, MEng, MEd, MSW, MBA)  Occupational History   Occupation: PHARMACIST    Employer: [employer]  Tobacco Use   Smoking status: Never    Passive exposure: Never   Smokeless tobacco: Never  Vaping Use   Vaping status: Never Used  Substance and Sexual Activity   Alcohol use: No    Alcohol/week: 0.0 standard drinks of alcohol   Drug use: No   Sexual activity: Not Currently    Partners: Male  Other Topics Concern   Not on file  Social History Narrative   Not on file   Social Drivers of Health   Financial Resource Strain: Low Risk  (05/31/2023)   Overall Financial Resource Strain (CARDIA)    Difficulty  of Paying Living Expenses: Not very hard  Food Insecurity: No Food Insecurity (05/31/2023)   Hunger Vital Sign    Worried About Running Out of Food in the Last Year: Never true    Ran Out of Food in the Last Year: Never true  Transportation Needs: No Transportation Needs (05/31/2023)   PRAPARE - Administrator, Civil Service (Medical): No    Lack of Transportation (Non-Medical): No  Physical Activity: Insufficiently Active (05/31/2023)   Exercise Vital Sign    Days of Exercise per Week: 1 day    Minutes of Exercise per Session: 30 min  Stress: No Stress Concern Present (05/31/2023)   [employer] of Occupational  Health - Occupational Stress Questionnaire    Feeling of Stress : Not at all  Social Connections: Moderately Integrated (05/31/2023)   Social Connection and Isolation Panel    Frequency of Communication with Friends and Family: More than three times a week    Frequency of Social Gatherings with Friends and Family: More than three times a week    Attends Religious Services: More than 4 times per year    Active Member of Clubs or Organizations: Yes    Attends Banker Meetings: More than 4 times per year    Marital Status: Divorced  Intimate Partner Violence: Not At Risk (07/14/2023)   Humiliation, Afraid, Rape, and Kick questionnaire    Fear of Current or Ex-Partner: No    Emotionally Abused: No    Physically Abused: No    Sexually Abused: No    Family History  Problem Relation Age of Onset   Hypertension Mother    Liver disease Mother    Colon cancer Father 37   Throat cancer Father 83   Heart disease Brother    Aneurysm Brother    Heart attack Brother    Asthma Son    Aneurysm Sister    Aneurysm Brother    Aneurysm Sister    Heart attack Sister    Aneurysm Sister 18   Stroke Maternal Grandmother    Breast cancer Paternal Aunt 22       metastatic   Breast cancer Paternal Aunt 95       metastatic   Colon polyps Brother    Breast cancer Cousin    Breast cancer Paternal Aunt     Past Surgical History:  Procedure Laterality Date   BREAST BIOPSY     age 59 left breast   CARDIOVASCULAR STRESS TEST  05/19/11   05/19/11 Nuclear stress test ([facility]): Normal study, EF 87%   CESAREAN SECTION  09/2005   CT CORONARY CA SCORING  06/23/12   06/23/12 Cardiac CTA ([facility]): Ca score 0. Right dominant. Normal coronaries.   INTRACRANIAL ANEURYSM REPAIR     MYOMECTOMY  2005   RADIOLOGY WITH ANESTHESIA N/A 11/21/2014   Procedure: Embolization;  Surgeon: Thyra Nash, MD;  Location: [facility] OR;  Service: Radiology;  Laterality: N/A;   US  ECHOCARDIOGRAPHY   05/18/11   05/18/11 Echo ([facility]): NL chamber size, LV systolic function, wall motion. LVEF 77%. Mild LVH. Mild MR/TR. NL PAP.    ROS: Review of Systems Negative except as stated above  PHYSICAL EXAM: BP 108/69   Pulse 72   Temp 98.2 F (36.8 C) (Oral)   Resp 16   Ht 5' 3 (1.6 m)   Wt 113 lb 9.6 oz (51.5 kg)   LMP  (LMP Unknown)   SpO2 98%   BMI 20.12  kg/m   Physical Exam HENT:     Head: Normocephalic and atraumatic.     Nose: Nose normal.     Mouth/Throat:     Mouth: Mucous membranes are moist.     Pharynx: Oropharynx is clear.  Eyes:     General: Lids are normal. Vision grossly intact. Gaze aligned appropriately.     Extraocular Movements: Extraocular movements intact.     Conjunctiva/sclera: Conjunctivae normal.     Pupils: Pupils are equal, round, and reactive to light.     Comments: Left eye irritation, no drainage.  Cardiovascular:     Rate and Rhythm: Normal rate and regular rhythm.     Pulses: Normal pulses.     Heart sounds: Normal heart sounds.  Pulmonary:     Effort: Pulmonary effort is normal.     Breath sounds: Normal breath sounds.  Musculoskeletal:        General: Normal range of motion.     Cervical back: Normal range of motion and neck supple.  Neurological:     General: No focal deficit present.     Mental Status: She is alert and oriented to person, place, and time.  Psychiatric:        Mood and Affect: Mood normal.        Behavior: Behavior normal.    ASSESSMENT AND PLAN: 1. Irritation of left eye (Primary) - Erythromycin  ophthalmic ointment as prescribed. Counseled on medication adherence/adverse effects.  - Routine screening.  - Follow-up with primary provider as scheduled.  - Varicella-zoster by PCR - erythromycin  ophthalmic ointment; Place 1 Application into the left eye at bedtime as needed.  Dispense: 7 g; Refill: 0   Patient was given the opportunity to ask questions.  Patient verbalized understanding of the plan and was  able to repeat key elements of the plan. Patient was given clear instructions to go to Emergency Department or return to medical center if symptoms don't improve, worsen, or new problems develop.The patient verbalized understanding.   Orders Placed This Encounter  Procedures   Varicella-zoster by PCR     Requested Prescriptions   Signed Prescriptions Disp Refills   erythromycin  ophthalmic ointment 7 g 0    Sig: Place 1 Application into the left eye at bedtime as needed.    Return for Follow-up as needed.  Greig JINNY Drones, NP

## 2023-11-23 ENCOUNTER — Ambulatory Visit: Payer: Self-pay | Admitting: Family

## 2023-11-23 LAB — VARICELLA-ZOSTER BY PCR: Varicella-Zoster, PCR: NEGATIVE

## 2023-11-23 NOTE — Telephone Encounter (Signed)
 Continue with plan discussed during office visit on 11/22/2023. During the interim report to the Emergency Department/Urgent Care/call 911 for immediate medical evaluation.

## 2023-11-25 NOTE — Telephone Encounter (Signed)
 Pt was seen by provider 9/2 after original CRM was sent. No new updates

## 2024-01-02 ENCOUNTER — Other Ambulatory Visit (HOSPITAL_COMMUNITY): Payer: Self-pay | Admitting: Radiology

## 2024-01-02 DIAGNOSIS — I671 Cerebral aneurysm, nonruptured: Secondary | ICD-10-CM

## 2024-01-12 ENCOUNTER — Encounter: Payer: Self-pay | Admitting: Neuroradiology

## 2024-01-12 ENCOUNTER — Ambulatory Visit: Admitting: Neuroradiology

## 2024-01-12 VITALS — BP 113/70 | HR 66 | Temp 99.1°F | Ht 63.0 in | Wt 114.2 lb

## 2024-01-12 DIAGNOSIS — I671 Cerebral aneurysm, nonruptured: Secondary | ICD-10-CM | POA: Diagnosis not present

## 2024-01-12 DIAGNOSIS — Z95828 Presence of other vascular implants and grafts: Secondary | ICD-10-CM

## 2024-01-12 DIAGNOSIS — Z8249 Family history of ischemic heart disease and other diseases of the circulatory system: Secondary | ICD-10-CM | POA: Diagnosis not present

## 2024-01-12 NOTE — Progress Notes (Signed)
 I had the pleasure of meeting Sharon Mendoza in the office today to discuss her brain aneurysms.  She has a strong family history of brain aneurysms.  In fact, she is 1 of 14 siblings, 7 of whom have had brain aneurysms.  There have been a couple of aneurysmal deaths, and multiple aneurysm treatments among her siblings.  She was followed by Dr. Monna who placed a pipeline in 2016 in the right internal carotid artery for treatment of 2 aneurysms, 3 mm and 2 mm respectively.  She has been followed with serial MR angiograms.  I reviewed the last 2, from March 10, 2022 and from February 05, 2021.  The pipeline device is patent.  There is a 2 mm cavernous aneurysm on the proximal end of the stent.  No intradural aneurysms.  Sharon Mendoza has been taking aspirin  and clopidogrel  ever since the stent was placed in 2016.  She is a remarkably healthy person.  She does not have any chronic medical conditions, does not smoke or drink alcohol and maintains a normal weight.  Assessment:  1.  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  2 mm right cavernous aneurysm 3.  Strong family history of brain aneurysms.  Recommendations:  1.  Stop clopidogrel .  I have explained the clopidogrel  is normally used with pipeline for 6 months. 2.  She may continue the 81 mg aspirin .  It is probably not necessary.  It certainly can be stopped anytime as needed for surgical or dental procedures. 3.  I will check an MR angiogram as it has been 2 years since her last study and she has an unusually strong family history of brain aneurysms.  If this is negative, we can probably extend the follow-up interval  I spent 30 minutes with review of her previous imaging and consultation

## 2024-02-06 ENCOUNTER — Encounter: Payer: Self-pay | Admitting: Family

## 2024-02-07 ENCOUNTER — Other Ambulatory Visit: Payer: Self-pay | Admitting: Family

## 2024-02-07 DIAGNOSIS — I10 Essential (primary) hypertension: Secondary | ICD-10-CM

## 2024-02-07 NOTE — Telephone Encounter (Unsigned)
 Copied from CRM #[id number]. Topic: Clinical - Medication Refill >> Feb 07, 2024  4:06 PM Lonell PEDLAR wrote: Medication: Enalapril  5mg  and Atenolol  25mg   Has the patient contacted their pharmacy? Yes, pharmacy states that they contact pcp  This is the patient's preferred pharmacy:  [facility] [id number] [place], [place] - [address] [place] [address] Phone: [phone] Fax: [phone]  Is this the correct pharmacy for this prescription? Yes If no, delete pharmacy and type the correct one.   Has the prescription been filled recently? Yes  Is the patient out of the medication? Yes  Has the patient been seen for an appointment in the last year OR does the patient have an upcoming appointment? Yes  Can we respond through MyChart? Yes  Agent: Please be advised that Rx refills may take up to 3 business days. We ask that you follow-up with your pharmacy.

## 2024-02-09 NOTE — Telephone Encounter (Signed)
 I called patient to make her aware that I just learned about her medication refill request PCP is not in however I sent it to her and I will follow up with her in the am and call her back.

## 2024-02-09 NOTE — Telephone Encounter (Signed)
 Copied from CRM #[id number]. Topic: Clinical - Medication Question >> Feb 09, 2024  2:33 PM Fonda T wrote: Reason for CRM: Pt calling states she requested medication refills on blood pressure medications on 02/07/24, and they were denied, per pharamacy.  Medications: Enalapril  5mg  and Atenolol  25mg   Per chart review, medications are in pending status.   Pt would like to know reason for denial. Pt reports she has been without medication since Friday, 02/03/24.  Requesting a follow up call to discuss further at [phone].  Pt aware of same day call back.

## 2024-02-10 ENCOUNTER — Other Ambulatory Visit: Payer: Self-pay | Admitting: Family

## 2024-02-10 DIAGNOSIS — I10 Essential (primary) hypertension: Secondary | ICD-10-CM

## 2024-02-10 MED ORDER — ATENOLOL 50 MG PO TABS: 50.0000 mg | ORAL_TABLET | Freq: Every day | ORAL | 0 refills | Status: AC

## 2024-02-10 MED ORDER — ENALAPRIL MALEATE 5 MG PO TABS: 5.0000 mg | ORAL_TABLET | Freq: Every day | ORAL | 0 refills | Status: AC

## 2024-02-10 NOTE — Telephone Encounter (Signed)
 Complete

## 2024-02-10 NOTE — Telephone Encounter (Signed)
 I called patient and made her aware her prescription has been sent in

## 2024-03-06 ENCOUNTER — Inpatient Hospital Stay: Admission: RE | Admit: 2024-03-06 | Discharge: 2024-03-06 | Attending: Neuroradiology | Admitting: Neuroradiology

## 2024-03-06 DIAGNOSIS — I671 Cerebral aneurysm, nonruptured: Secondary | ICD-10-CM

## 2024-03-26 ENCOUNTER — Telehealth: Payer: Self-pay | Admitting: Neuroradiology

## 2024-03-26 ENCOUNTER — Encounter: Payer: Self-pay | Admitting: Neuroradiology

## 2024-03-26 NOTE — Telephone Encounter (Signed)
 I reviewed her MR angiogram from 03/06/2024.  The pipeline device in the right carotid artery is patent.  No intradural aneurysm.  There is a 2-3 mm right cavernous aneurysm which is inconsequential.  Her aneurysms were treated in 2016 with flow diversion.  No further routine follow-up needed.

## 2024-06-07 ENCOUNTER — Ambulatory Visit: Admitting: Obstetrics and Gynecology

## 2024-07-13 ENCOUNTER — Encounter: Admitting: Family
